# Patient Record
Sex: Female | Born: 1985 | Hispanic: Yes | Marital: Married | Smoking: Never smoker
Health system: Southern US, Community
[De-identification: ages and names within clinical notes are randomized; demographics above are authoritative.]

## PROBLEM LIST (undated history)

## (undated) DIAGNOSIS — R102 Pelvic and perineal pain: Secondary | ICD-10-CM

## (undated) DIAGNOSIS — K219 Gastro-esophageal reflux disease without esophagitis: Secondary | ICD-10-CM

## (undated) DIAGNOSIS — M549 Dorsalgia, unspecified: Secondary | ICD-10-CM

## (undated) DIAGNOSIS — Z973 Presence of spectacles and contact lenses: Secondary | ICD-10-CM

## (undated) DIAGNOSIS — F32A Depression, unspecified: Secondary | ICD-10-CM

## (undated) DIAGNOSIS — F419 Anxiety disorder, unspecified: Secondary | ICD-10-CM

## (undated) DIAGNOSIS — R3915 Urgency of urination: Secondary | ICD-10-CM

## (undated) DIAGNOSIS — K589 Irritable bowel syndrome without diarrhea: Secondary | ICD-10-CM

## (undated) DIAGNOSIS — F329 Major depressive disorder, single episode, unspecified: Secondary | ICD-10-CM

## (undated) DIAGNOSIS — R51 Headache: Principal | ICD-10-CM

## (undated) DIAGNOSIS — J309 Allergic rhinitis, unspecified: Secondary | ICD-10-CM

## (undated) DIAGNOSIS — E7800 Pure hypercholesterolemia, unspecified: Secondary | ICD-10-CM

## (undated) DIAGNOSIS — M1990 Unspecified osteoarthritis, unspecified site: Secondary | ICD-10-CM

## (undated) DIAGNOSIS — E739 Lactose intolerance, unspecified: Secondary | ICD-10-CM

## (undated) DIAGNOSIS — J45909 Unspecified asthma, uncomplicated: Secondary | ICD-10-CM

## (undated) DIAGNOSIS — M2550 Pain in unspecified joint: Secondary | ICD-10-CM

## (undated) DIAGNOSIS — R350 Frequency of micturition: Secondary | ICD-10-CM

## (undated) HISTORY — DX: Lactose intolerance, unspecified: E73.9

## (undated) HISTORY — DX: Gastro-esophageal reflux disease without esophagitis: K21.9

## (undated) HISTORY — DX: Pain in unspecified joint: M25.50

## (undated) HISTORY — DX: Unspecified asthma, uncomplicated: J45.909

## (undated) HISTORY — DX: Anxiety disorder, unspecified: F41.9

## (undated) HISTORY — DX: Pure hypercholesterolemia, unspecified: E78.00

## (undated) HISTORY — DX: Depression, unspecified: F32.A

## (undated) HISTORY — DX: Irritable bowel syndrome, unspecified: K58.9

## (undated) HISTORY — DX: Dorsalgia, unspecified: M54.9

## (undated) HISTORY — DX: Unspecified osteoarthritis, unspecified site: M19.90

## (undated) HISTORY — DX: Major depressive disorder, single episode, unspecified: F32.9

## (undated) HISTORY — DX: Headache: R51

## (undated) HISTORY — DX: Allergic rhinitis, unspecified: J30.9

---

## 2008-08-17 HISTORY — PX: OTHER SURGICAL HISTORY: SHX169

## 2010-04-24 ENCOUNTER — Inpatient Hospital Stay (HOSPITAL_COMMUNITY)
Admission: AD | Admit: 2010-04-24 | Discharge: 2010-04-26 | Payer: Self-pay | Source: Home / Self Care | Admitting: Obstetrics and Gynecology

## 2010-08-12 ENCOUNTER — Encounter (INDEPENDENT_AMBULATORY_CARE_PROVIDER_SITE_OTHER): Payer: Self-pay | Admitting: Obstetrics & Gynecology

## 2010-08-12 ENCOUNTER — Inpatient Hospital Stay (HOSPITAL_COMMUNITY)
Admission: AD | Admit: 2010-08-12 | Discharge: 2010-08-14 | Payer: Self-pay | Source: Home / Self Care | Attending: Obstetrics & Gynecology | Admitting: Obstetrics & Gynecology

## 2010-08-12 IMAGING — US US OB COMP +14 WK
1 series · 12 of 28 positions shown · non-contrast
Comparison: none

[Series 1: us ob comp +14 wk · 12 of 33 slices shown]
[im 2/33]
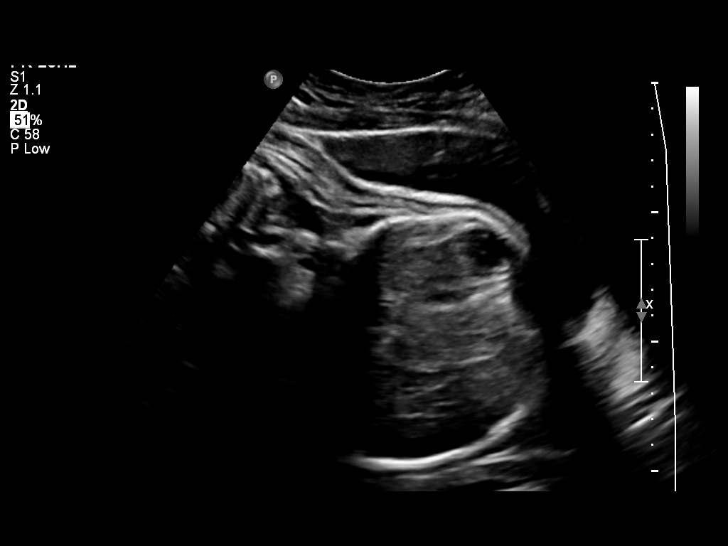
[im 4/33]
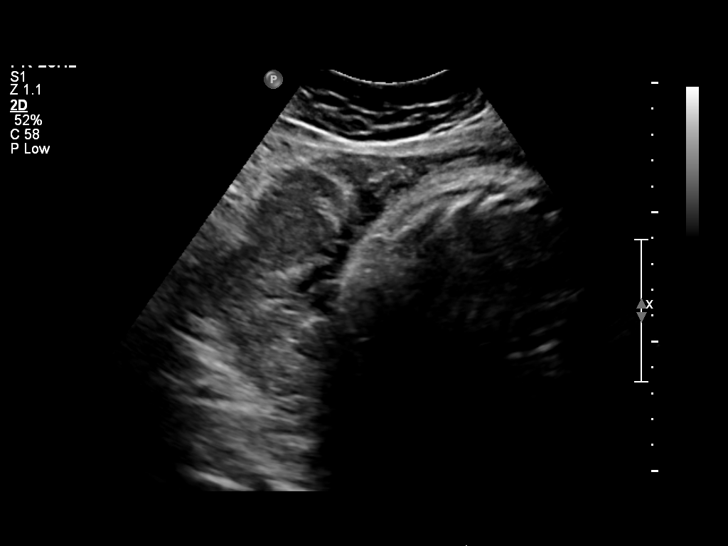
[im 6/33]
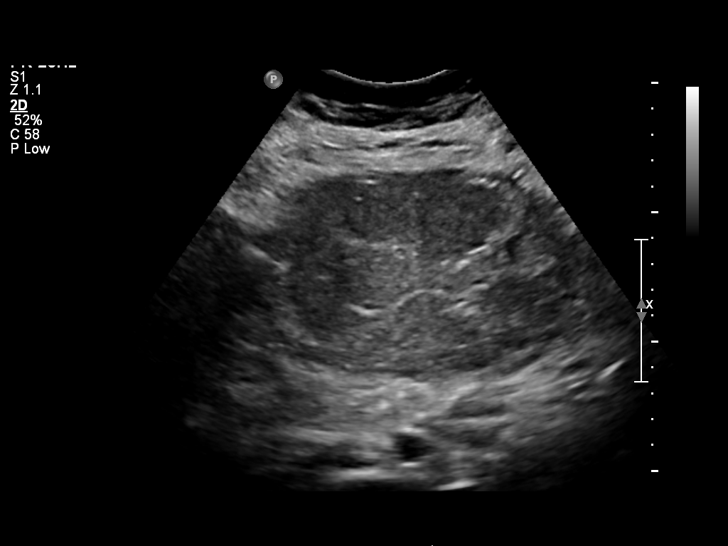
[im 10/33]
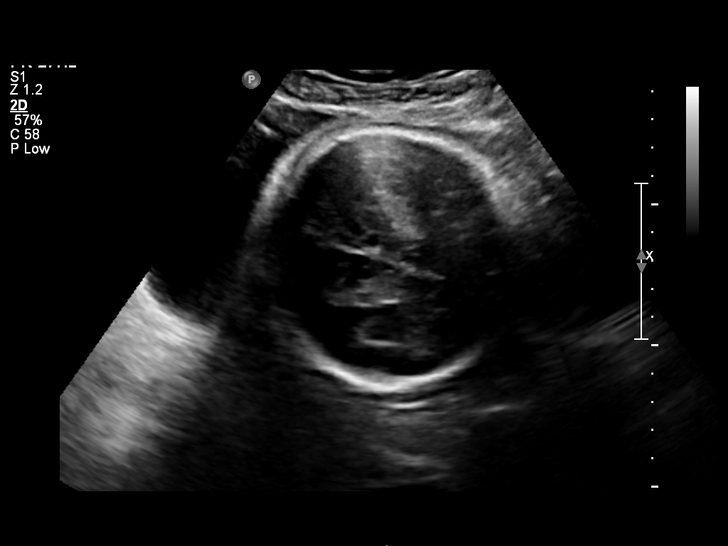
[im 12/33]
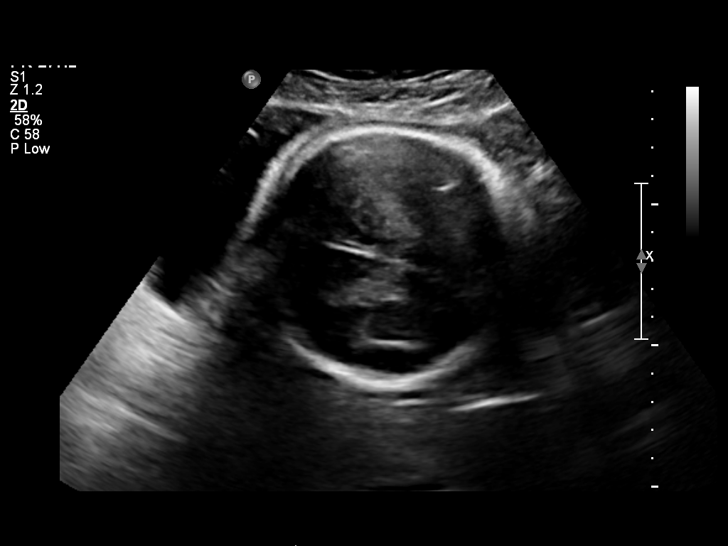
[im 15/33]
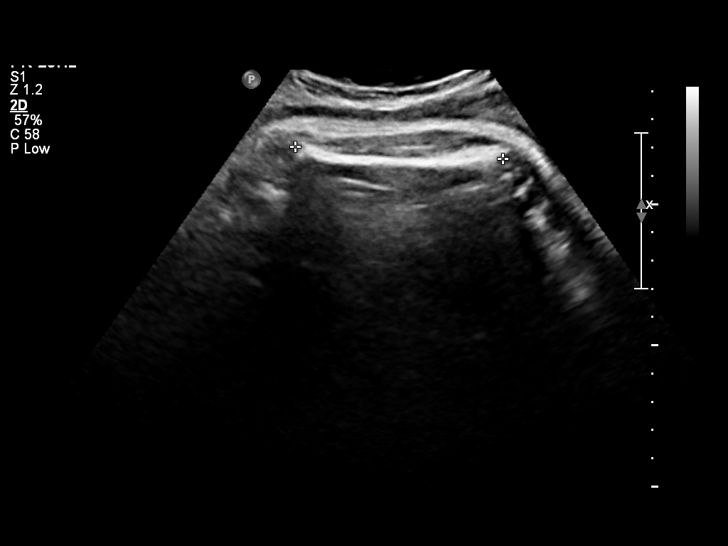
[im 18/33]
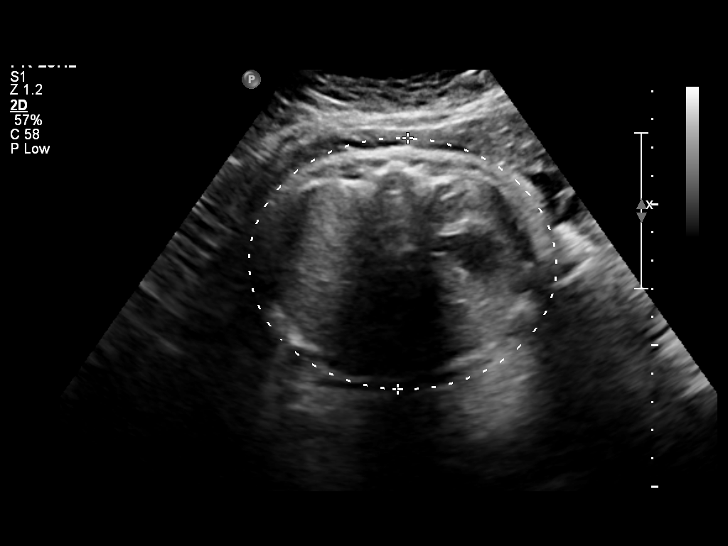
[im 21/33]
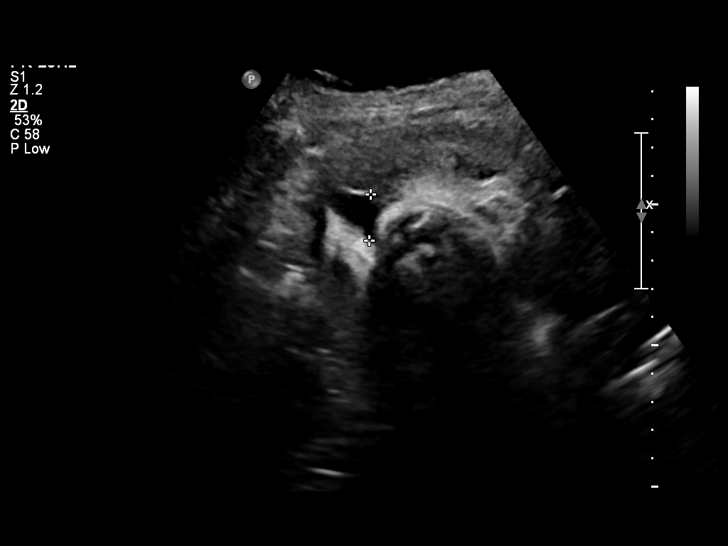
[im 23/33]
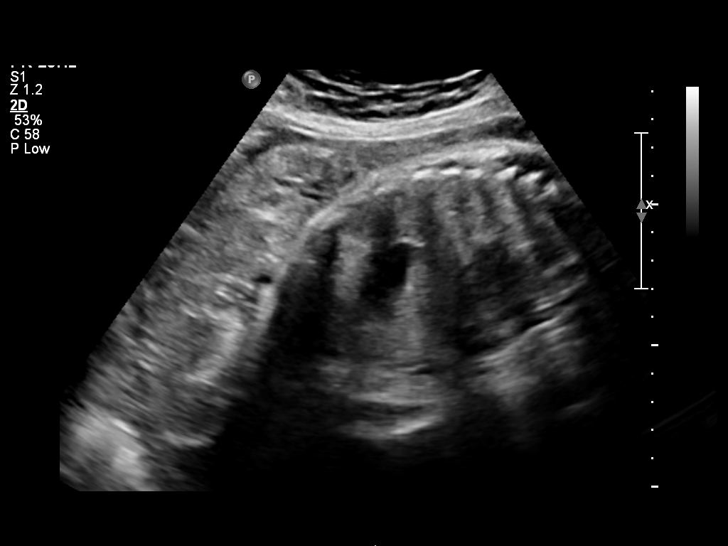
[im 27/33]
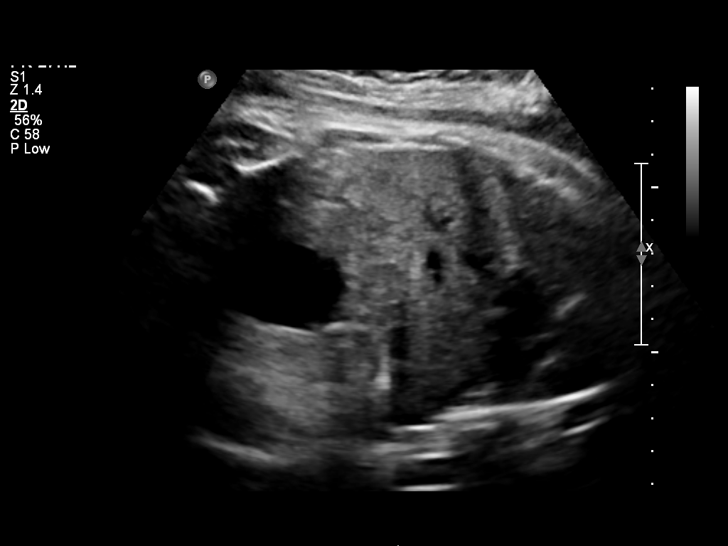
[im 29/33]
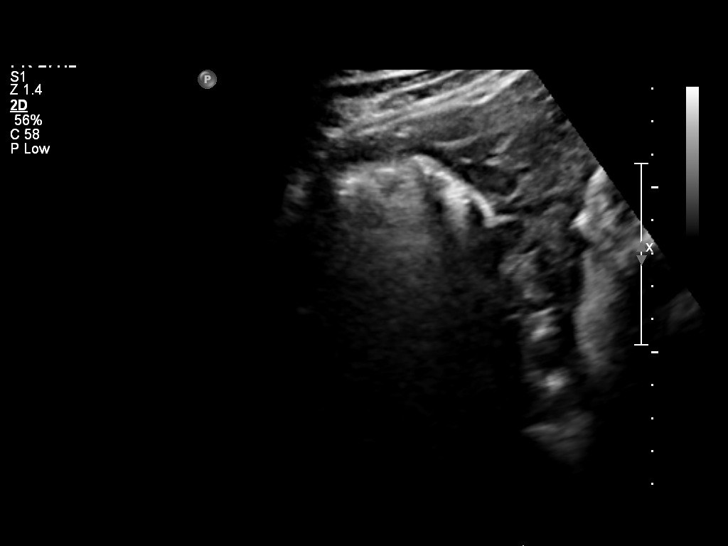
[im 31/33]
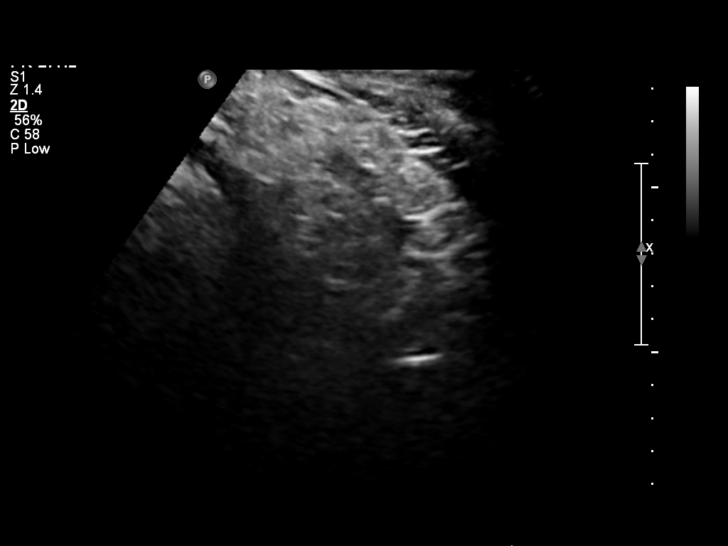

[12 of 28 positions shown; findings below may reference images not displayed]

OBSTETRICS REPORT

 Name:     RUBINSTEIN                   Visit Date: [DATE] [DATE]

Procedures

 US OB COMP +14 WK                                     76805.1
 US UA Cord Doppler                                    [S3]
Indications

 Assess Fetal Growth / Estimated Fetal Weight
 Assess amniotic fluid volume
 Premature rupture of membranes - leaking fluid
 Uncertain fetal presentation
 Previous pre-term deliveries  28 wks                  [S3]
 Previous cesarean section
 Size less than dates (Small for gestational [AGE]
 FGR)
Fetal Evaluation

 Fetal Heart Rate:  152                          bpm
 Cardiac Activity:  Observed
 Presentation:      Cephalic
 Placenta:          Anterior Fundal, above
                    cervical os
 P. Cord            Not well visualized
 Insertion:

 Amniotic Fluid
 AFI FV:      Oligohydramnios
 AFI Sum:     3.8     cm      < 3  %Tile
Biometry

 BPD:     86.6  mm     G. Age:  35w 0d                CI:        75.77   70 - 86
                                                      FL/HC:      23.0   20.6 -

 HC:     315.4  mm     G. Age:  35w 3d      < 3  %    HC/AC:      1.01   0.87 -

 AC:     312.9  mm     G. Age:  35w 1d      < 3  %    FL/BPD:     83.8   71 - 87
 FL:      72.6  mm     G. Age:  37w 1d       12  %    FL/AC:      23.2   20 - 24

 Est. FW:    [S3]  gm      6 lb 1 oz     13  %
Gestational Age
 Clinical EDD:  39w 2d                                        EDD:   [DATE]
 U/S Today:     35w 5d                                        EDD:   [DATE]
 Best:          39w 2d     Det. By:  Clinical EDD             EDD:   [DATE]
Anatomy

 Cranium:           Appears normal      Aortic Arch:       Not well
                                                           visualized
 Fetal Cavum:       Appears normal      Ductal Arch:       Not well
                                                           visualized
 Ventricles:        Appears normal      Diaphragm:         Appears normal
 Choroid Plexus:    Appears normal      Stomach:           Appears normal
 Cerebellum:        Not well            Abdomen:           Appears normal
                    visualized
 Posterior Fossa:   Not well            Abdominal Wall:    Not well
                    visualized                             visualized
 Nuchal Fold:       Not applicable      Cord Vessels:      Not well
                    (>20 wks GA)                           visualized
 Face:              Not well            Kidneys:           Appear normal
                    visualized
 Heart:             Not well            Bladder:           Appears normal
                    visualized
 RVOT:              Not well            Spine:             Not well
                    visualized                             visualized
 LVOT:              Not well            Limbs:             Not well
                    visualized                             visualized

 Other:     Technically difficult due to advanced GA and fetal
            position. Technically difficult due to  maternal habitus.
Doppler - Fetal Vessels

 Umbilical Artery
 S/D:   2.9            85  %tile
 Umbilical Artery
 Absent DFV:    No     Reverse DFV:    No

Cervix Uterus Adnexa

 Cervix:       Not visualized (advanced GA >34 wks)
 Left Ovary:    Not visualized.
 Right Ovary:   Not visualized.
Impression

 Single living intrauterine pregnancy in cephalic presentation.
 The estimated gestational age is 39w 2d based on Clinical
 EDD.
 Estimated fetal weight is 2753g,  13th percentile for
 gestational age of   39w 2d. Correlation with dating
 parameters recommended.
 Oligohydramnios.
 S/D ratio is 2.9, 85% for GA.
 No AEDF or RDF noted.

 Thank you for sharing in the care of Ms. RUBINSTEIN with
 questions or concerns.

## 2010-08-13 ENCOUNTER — Inpatient Hospital Stay: Admission: RE | Admit: 2010-08-13 | Payer: Self-pay | Source: Home / Self Care | Admitting: Obstetrics & Gynecology

## 2010-08-17 HISTORY — PX: ABDOMINAL HYSTERECTOMY: SHX81

## 2010-09-12 ENCOUNTER — Other Ambulatory Visit: Payer: Self-pay | Admitting: Obstetrics and Gynecology

## 2010-09-20 NOTE — Discharge Summary (Signed)
  NAMEJESSYCA, Annette Ellison                  ACCOUNT NO.:  [id number]  MEDICAL RECORD NO.:  [id number]          PATIENT TYPE:  INP  LOCATION:  9133                          FACILITY:  [facility]  PHYSICIAN:  Carrington Clamp, M.D. DATE OF BIRTH:  02-01-1986  DATE OF ADMISSION:  08/12/2010 DATE OF DISCHARGE:  08/14/2010                              DISCHARGE SUMMARY   FINAL DIAGNOSES:  Intrauterine gestation at 39-2/7 weeks' gestation, prolonged spontaneous rupture of membranes with oligohydramnios and meconium-stained amniotic fluid, borderline intrauterine growth restriction, elevated umbilical artery Dopplers, remote from vaginal delivery.  PROCEDURE:  Repeat low transverse cesarean section.  SURGEON:  Randye Lobo, M.D.  ASSISTANT:  Chinita [place], [place]  COMPLICATIONS:  None.  This 25 year old G2, P0-1-0-1 presents at 5 weeks' gestation with complaining of leaking of fluid.  The patient had a prior pregnancy which was delivered by cesarean section at 39 weeks.  The patient during this pregnancy had been followed for some borderline growth restriction.  She had an ultrasound performed on July 31, 2010, showing an estimated fetal weight in the 12th percentile with an AFI of 9.4.  The patient's BPP at that time was reassuring of 8/8.  A followup ultrasound showed an increase in her AFI to 16.43 with again another BPP of 8/8.  The patient is also positive for group B strep.  Upon admission to the hospital, the patient's cervix was only 1.5 cm dilated and -2 station.  Ultrasound confirmed rupture of membranes with an AFI of 3.8, and umbilical Dopplers which were elevated at 2.9.  There was prolonged rupture of membranes as well as meconium-stained amniotic fluid because of these changes in the elevated umbilical artery Dopplers, a decision was made to proceed with a repeat cesarean section.  The patient was taken to the operating room on August 12, 2010, by Dr. Conley Simmonds where a repeat  low transverse cesarean section was performed with the delivery of a 5 pound 11 ounce female infant with Apgars of 9 and 9. Delivery went without complications.  The patient's postoperative course was benign without any significant fevers.  The patient was felt ready for discharge on postoperative day #2 she was sent home on a regular diet, told to decrease activities, told to continue her vitamins.  The patient was given a prescription for Ultram to use every 6-8 hours as needed for her pain.  She did not want her little boy circumcised prior to discharge.  She was to follow up in our office on August 15, 2010, for her staple removal in the office.  Instructions and precautions were reviewed with the patient.  LABS ON DISCHARGE:  The patient had a hemoglobin of 11.0, white blood cell count of 11.5 and platelets of 170,000.     Leilani Able, P.A.-C.   ______________________________ Carrington Clamp, M.D.    MB/MEDQ  D:  09/15/2010  T:  09/16/2010  Job:  [id number]  Electronically Signed by Leilani Able P.A.-C. on 09/19/2010 04:43:48 PM Electronically Signed by Carrington Clamp MD on 09/20/2010 03:43:48 PM

## 2010-10-27 LAB — CBC
HCT: 33 % — ABNORMAL LOW (ref 36.0–46.0)
HCT: 37.4 % (ref 36.0–46.0)
Hemoglobin: 11 g/dL — ABNORMAL LOW (ref 12.0–15.0)
Hemoglobin: 12.8 g/dL (ref 12.0–15.0)
MCHC: 34.2 g/dL (ref 30.0–36.0)
Platelets: 170 10*3/uL (ref 150–400)
RBC: 4 MIL/uL (ref 3.87–5.11)
RBC: 4.53 MIL/uL (ref 3.87–5.11)
WBC: 11.5 10*3/uL — ABNORMAL HIGH (ref 4.0–10.5)

## 2010-10-27 LAB — ABO/RH: ABO/RH(D): O POS

## 2010-10-27 LAB — RPR: RPR Ser Ql: NONREACTIVE

## 2010-10-30 LAB — DIFFERENTIAL
Eosinophils Relative: 1 % (ref 0–5)
Lymphocytes Relative: 16 % (ref 12–46)
Lymphs Abs: 1.4 10*3/uL (ref 0.7–4.0)
Monocytes Relative: 8 % (ref 3–12)
Neutrophils Relative %: 76 % (ref 43–77)

## 2010-10-30 LAB — CBC
HCT: 35.4 % — ABNORMAL LOW (ref 36.0–46.0)
HCT: 39.3 % (ref 36.0–46.0)
Hemoglobin: 13.3 g/dL (ref 12.0–15.0)
MCH: 31.1 pg (ref 26.0–34.0)
Platelets: 220 10*3/uL (ref 150–400)
RBC: 3.9 MIL/uL (ref 3.87–5.11)
RDW: 12.9 % (ref 11.5–15.5)
WBC: 13.9 10*3/uL — ABNORMAL HIGH (ref 4.0–10.5)
WBC: 9.2 10*3/uL (ref 4.0–10.5)

## 2010-10-30 LAB — COMPREHENSIVE METABOLIC PANEL
AST: 21 U/L (ref 0–37)
BUN: 8 mg/dL (ref 6–23)
CO2: 21 mEq/L (ref 19–32)
Chloride: 105 mEq/L (ref 96–112)
Potassium: 3.4 mEq/L — ABNORMAL LOW (ref 3.5–5.1)
Sodium: 134 mEq/L — ABNORMAL LOW (ref 135–145)
Total Bilirubin: 0.3 mg/dL (ref 0.3–1.2)
Total Protein: 6.2 g/dL (ref 6.0–8.3)

## 2010-10-30 LAB — URINE CULTURE
Colony Count: >=100000
Culture  Setup Time: 201109090154

## 2010-10-30 LAB — URINALYSIS, ROUTINE W REFLEX MICROSCOPIC
Hgb urine dipstick: NEGATIVE
Ketones, ur: 40 mg/dL — AB
Nitrite: NEGATIVE
Protein, ur: NEGATIVE mg/dL
Urobilinogen, UA: 0.2 mg/dL (ref 0.0–1.0)
pH: 6 (ref 5.0–8.0)

## 2010-10-30 LAB — URINE MICROSCOPIC-ADD ON

## 2011-07-07 ENCOUNTER — Ambulatory Visit: Payer: Self-pay | Admitting: Obstetrics & Gynecology

## 2011-07-16 ENCOUNTER — Ambulatory Visit: Payer: Self-pay | Admitting: Obstetrics & Gynecology

## 2013-04-01 LAB — LAB REPORT - SCANNED

## 2013-04-10 LAB — HM COLONOSCOPY

## 2013-10-31 ENCOUNTER — Encounter: Payer: Self-pay | Admitting: Neurology

## 2013-10-31 ENCOUNTER — Other Ambulatory Visit: Payer: Self-pay | Admitting: Family Medicine

## 2013-10-31 ENCOUNTER — Ambulatory Visit
Admission: RE | Admit: 2013-10-31 | Discharge: 2013-10-31 | Disposition: A | Discharge status: Home / Self Care | Payer: 59 | Source: Ambulatory Visit | Attending: Family Medicine | Admitting: Family Medicine

## 2013-10-31 DIAGNOSIS — M542 Cervicalgia: Secondary | ICD-10-CM

## 2013-10-31 IMAGING — CR DG CERVICAL SPINE COMPLETE 4+V
6 series · 6 of 6 positions shown · non-contrast
Comparison: None.

CLINICAL DATA: 27-year-old female with right neck pain radiating to
the shoulder. Right upper extremity numbness. Initial encounter.

EXAM:
CERVICAL SPINE  4+ VIEWS

[view not recorded (1 of 6)]
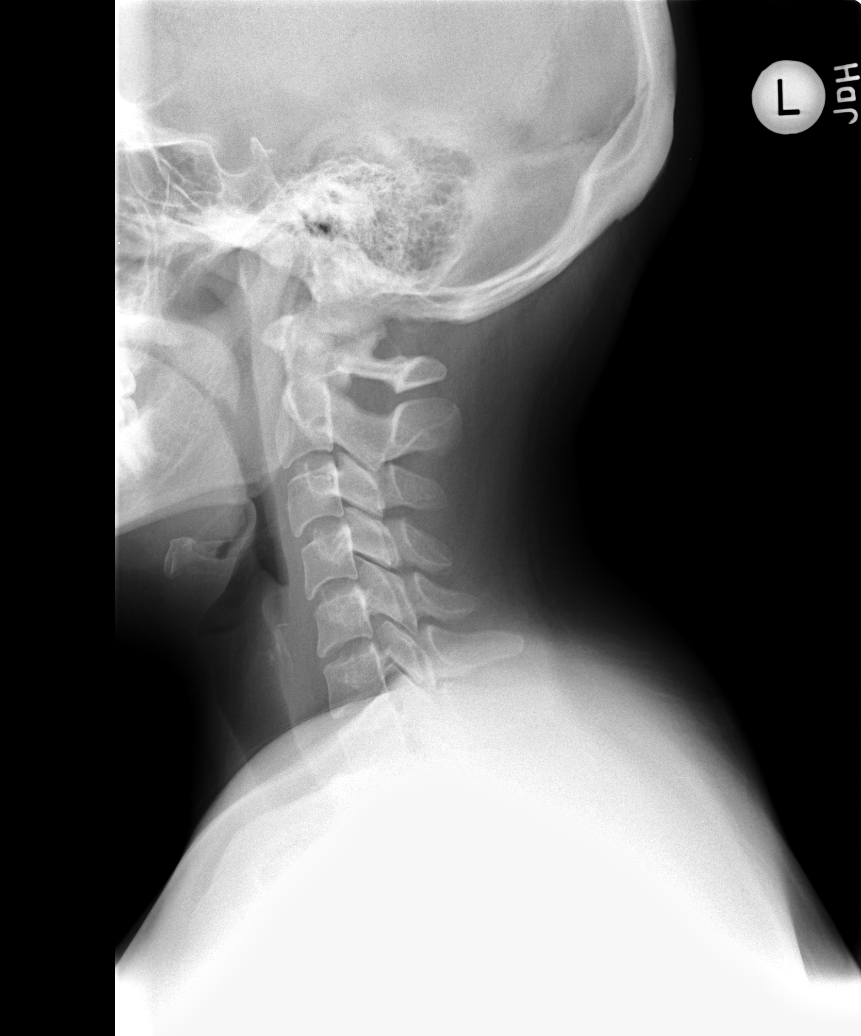

[view not recorded (2 of 6)]
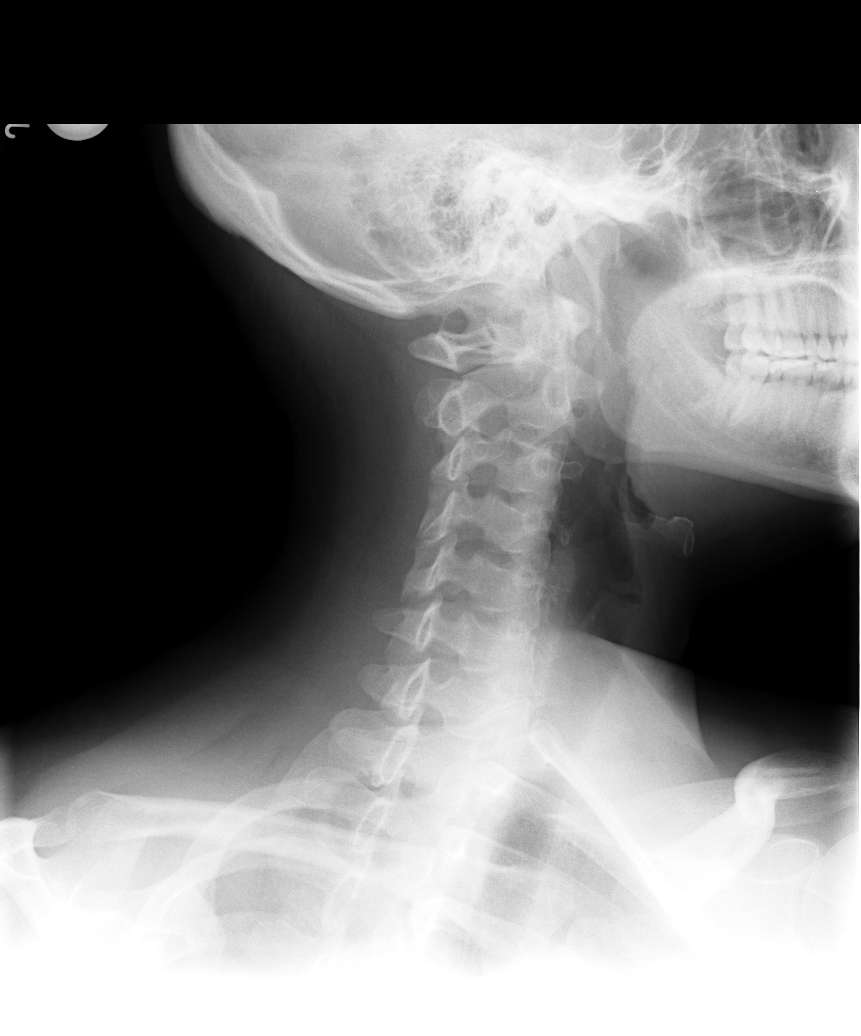

[view not recorded (3 of 6)]
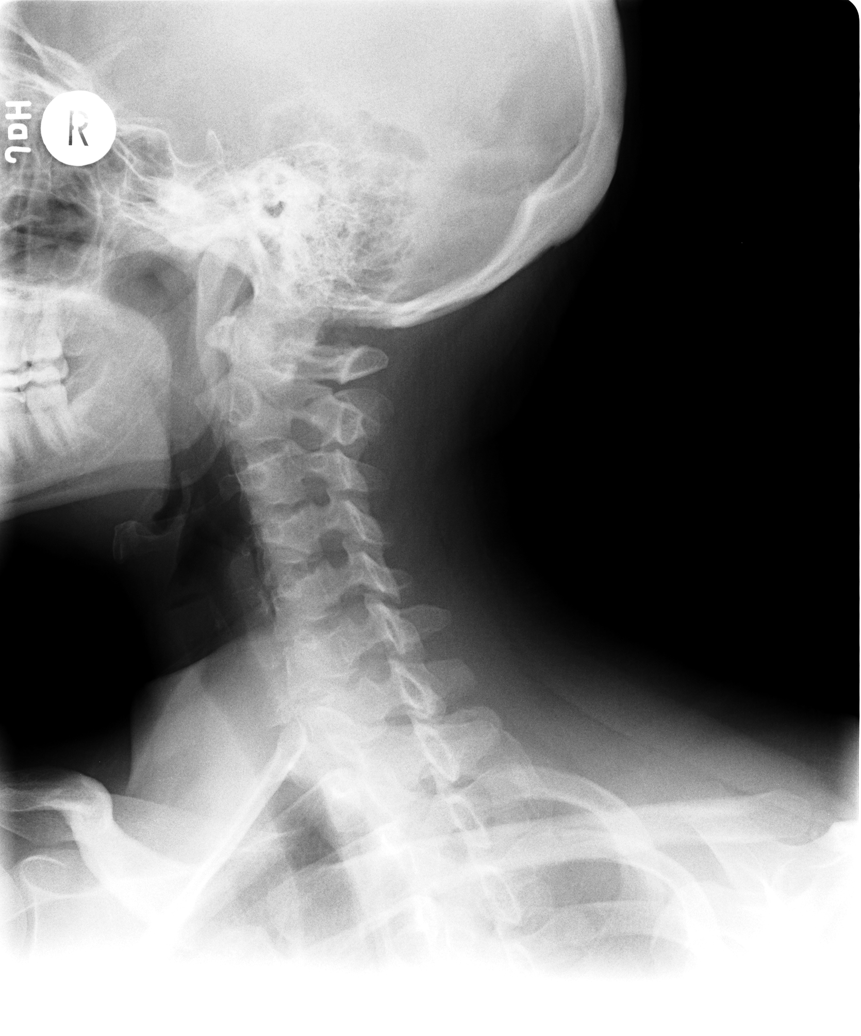

[view not recorded (4 of 6)]
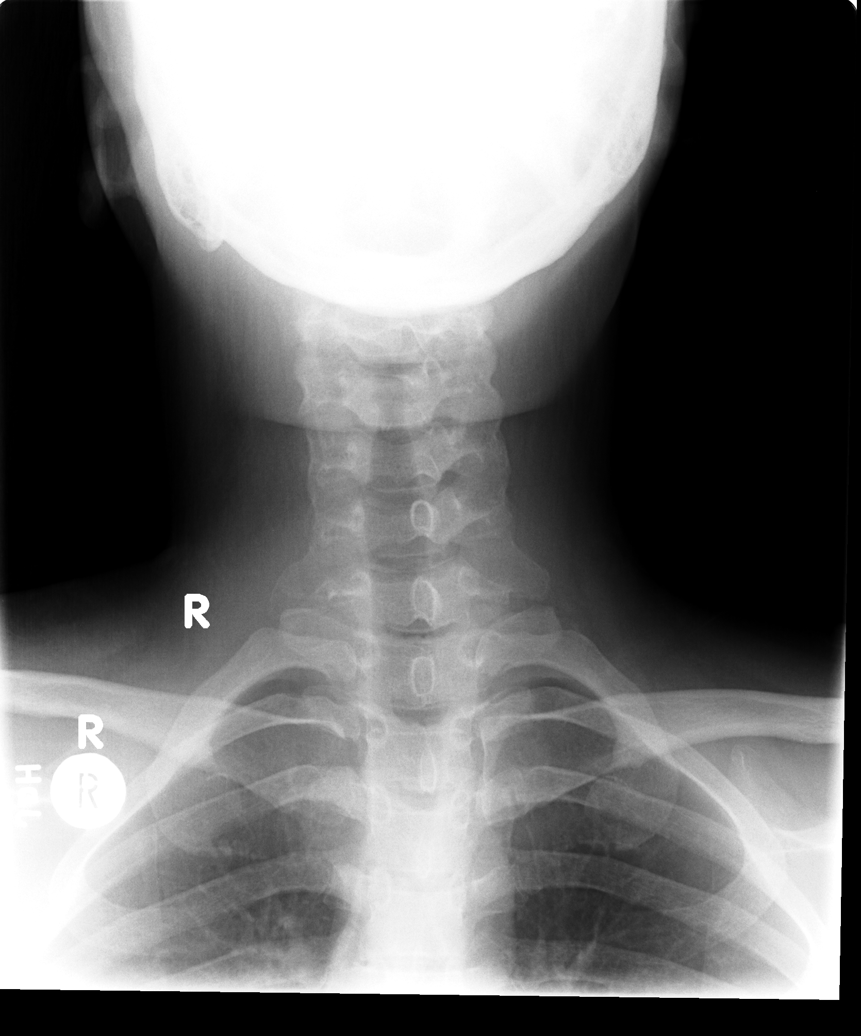

[view not recorded (5 of 6)]
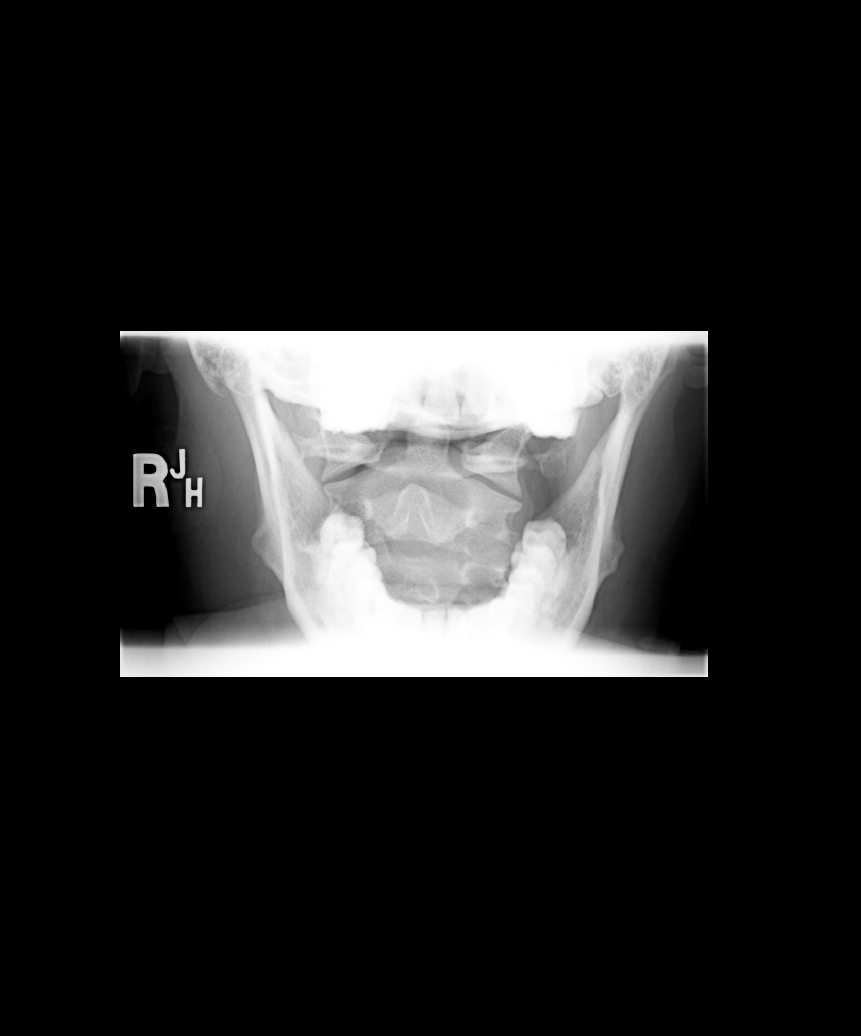

[view not recorded (6 of 6)]
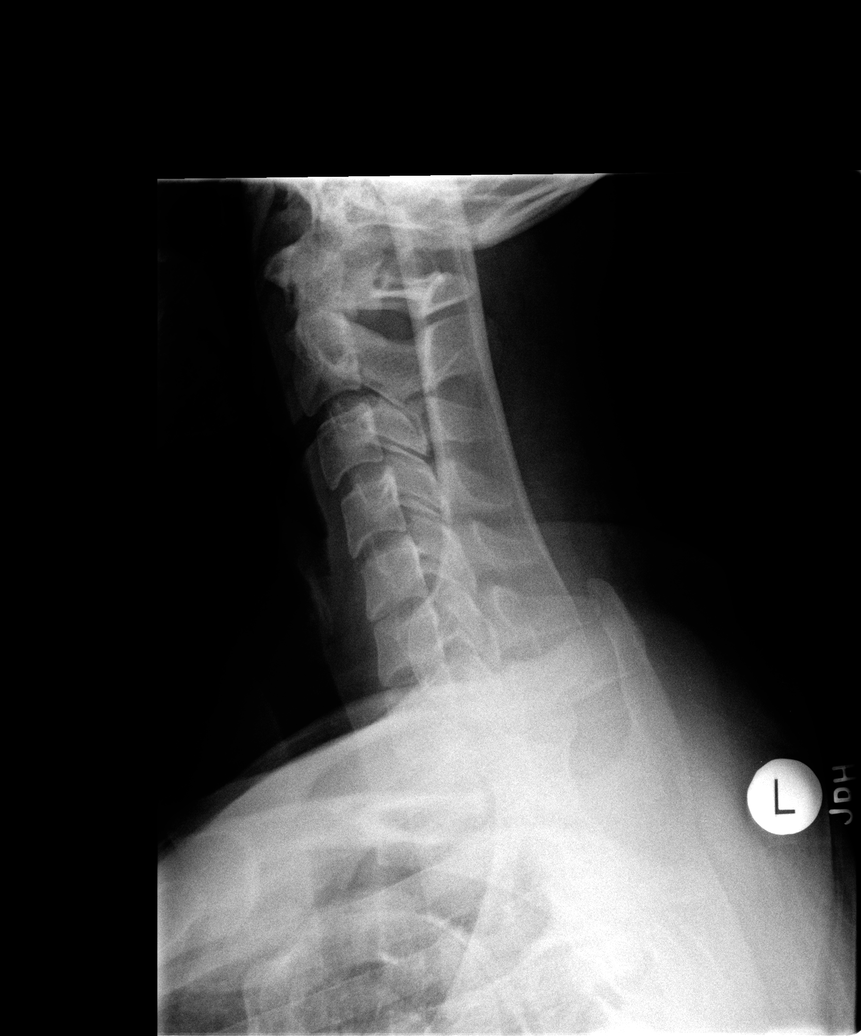

[6 of 6 positions shown; findings below may reference images not displayed]

FINDINGS: Normal prevertebral soft tissue contour. Straightening of cervical
lordosis. Preserved disc spaces. Bilateral posterior element
alignment is within normal limits. Cervicothoracic junction
alignment is within normal limits. Mild cervicothoracic scoliosis
evident on the AP view. Negative lung apices. Normal C1-C2
alignment. The odontoid appears normal.
IMPRESSION: Negative cervical spine radiographs, aside from suspected mild
cervicothoracic scoliosis with superimposed straightening of
lordosis.

## 2013-11-01 ENCOUNTER — Ambulatory Visit (INDEPENDENT_AMBULATORY_CARE_PROVIDER_SITE_OTHER): Payer: 59 | Admitting: Neurology

## 2013-11-01 ENCOUNTER — Encounter: Payer: Self-pay | Admitting: Neurology

## 2013-11-01 VITALS — BP 120/76 | HR 76 | Ht 62.0 in | Wt 155.0 lb

## 2013-11-01 DIAGNOSIS — R519 Headache, unspecified: Secondary | ICD-10-CM

## 2013-11-01 DIAGNOSIS — R51 Headache: Secondary | ICD-10-CM

## 2013-11-01 MED ORDER — SUMATRIPTAN SUCCINATE 100 MG PO TABS: 100.0000 mg | ORAL_TABLET | ORAL | Status: DC | PRN

## 2013-11-01 NOTE — Progress Notes (Signed)
PATIENT: Annette Ellison DOB: 02-22-1986  HISTORICAL  Annette Ellison is a 28 years old right-handed female, referred by her primary care physician Dr. Chapman Fitch for evaluation of headaches, facial numbness.  She had a history of headaches since elementary school, usually triggered by bright light, reading, strong smell, such as perfume, she would develop lateralized pounding headache was associated light noise sensitivity, lasting couple hours to days, she only had few headache each month,  But over the past few years, she has more frequent headaches, she has been taking Excedrin Migraine, at least 4 days out of the week for few years, she usually take 4 tablets at once, sometimes 2 more tablets before she goes to bed, about 3 months ago, her headache got more frequent, she began to have headaches every day, Excedrin Migraine was no longer helpful, she has constant right retro-orbital area and bilateral temporal area pressure pulsating pain, sometimes go down to her right occipital neck area, 5/10 on daily basis,  [facility], was given prescription of Topamax 25 mg 3 tablets every night, she complains of facial numbness after she took the medication, and also a mild drowsiness this morning,  Previously she has tried Imitrex, which was very effective as abortive treatment, she tried trigger point injection in the past, complained of muscle achy pain, otherwise never tried other preventive medications,  REVIEW OF SYSTEMS: Full 14 system review of systems performed and notable only for weight gain, fatigue, eye pain, headache, numbness, insomnia, anxiety, decreased energy, change in appetite  ALLERGIES: Allergies not on file  HOME MEDICATIONS: No current outpatient prescriptions on file prior to visit.   No current facility-administered medications on file prior to visit.    PAST MEDICAL HISTORY: Past Medical History  Diagnosis Date  . High  cholesterol   . HA (headache)     PAST SURGICAL HISTORY: Past Surgical History  Procedure Laterality Date  . Abdominal hysterectomy      FAMILY HISTORY: Family History  Problem Relation Age of Onset  . Migraines Mother   . High Cholesterol Mother   . High blood pressure Mother     SOCIAL HISTORY:  History   Social History  . Marital Status: Single    Spouse Name: N/A    Number of Children: 2  . Years of Education: College   Occupational History  . [employer]   Social History Main Topics  . Smoking status: Never Smoker   . Smokeless tobacco: Never Used  . Alcohol Use: 0.6 oz/week    1 Glasses of wine per week     Comment: OCC  . Drug Use: No  . Sexual Activity: Not on file   Other Topics Concern  . Not on file   Social History Narrative   Patient lives at home with her partner. Patient works Full time at [employer]   Right handed   Caffeine two cups of coffee daily     PHYSICAL EXAM   Filed Vitals:   11/01/13 0758  BP: 120/76  Pulse: 76  Height: _0  (1.575 m)  Weight: 155 lb (70.308 kg)    Not recorded    Body mass index is 28.34 kg/(m^2).   Generalized: In no acute distress  Neck: Supple, no carotid bruits   Cardiac: Regular rate rhythm  Pulmonary: Clear to auscultation bilaterally  Musculoskeletal: No deformity  Neurological examination  Mentation: Alert oriented to  time, place, history taking, and causual conversation  Cranial nerve II-XII: Pupils were equal round reactive to light. Extraocular movements were full.  Visual field were full on confrontational test. Bilateral fundi were sharp.  Facial sensation and strength were normal. Hearing was intact to finger rubbing bilaterally. Uvula tongue midline.  Head turning and shoulder shrug and were normal and symmetric.Tongue protrusion into cheek strength was normal.  Motor: Normal tone, bulk and strength.  Sensory: Intact to fine touch, pinprick, preserved  vibratory sensation, and proprioception at toes.  Coordination: Normal finger to nose, heel-to-shin bilaterally there was no truncal ataxia  Gait: Rising up from seated position without assistance, normal stance, without trunk ataxia, moderate stride, good arm swing, smooth turning, able to perform tiptoe, and heel walking without difficulty.   Romberg signs: Negative  Deep tendon reflexes: Brachioradialis 2/2, biceps 2/2, triceps 2/2, patellar 2/2, Achilles 2/2, plantar responses were flexor bilaterally.   DIAGNOSTIC DATA (LABS, IMAGING, TESTING) - I reviewed patient records, labs, notes, testing and imaging myself where available.  Lab Results  Component Value Date   WBC 11.5* 08/13/2010   HGB 11.0* 08/13/2010   HCT 33.0* 08/13/2010   MCV 82.5 08/13/2010   PLT 170 08/13/2010      Component Value Date/Time   NA 134* 04/24/2010 2026   K 3.4* 04/24/2010 2026   CL 105 04/24/2010 2026   CO2 21 04/24/2010 2026   GLUCOSE 69* 04/24/2010 2026   BUN 8 04/24/2010 2026   CREATININE 0.38* 04/24/2010 2026   CALCIUM 9.0 04/24/2010 2026   PROT 6.2 04/24/2010 2026   ALBUMIN 3.2* 04/24/2010 2026   AST 21 04/24/2010 2026   ALT 23 04/24/2010 2026   ALKPHOS 81 04/24/2010 2026   BILITOT 0.3 04/24/2010 2026   GFRNONAA >60 04/24/2010 2026   GFRAA  Value: >60        The eGFR has been calculated using the MDRD equation. This calculation has not been validated in all clinical situations. eGFR's persistently <60 mL/min signify possible Chronic Kidney Disease. 04/24/2010 2026    ASSESSMENT AND PLAN  Annette Ellison is a 28 y.o. female present with frequent migraine headaches, normal neurological examination,  her headache also has a component of medicine withdraw due to her daily high-dose Excedrin use.  1. continue topiramate 25 mg 3 tablets every night as preventive medications 2. As needed Imitrex, she decided [facility]   Marcial Pacas, M.D. Ph.D.  [facility] [address] [place], [place] [address] [phone]

## 2013-11-07 ENCOUNTER — Telehealth: Payer: Self-pay | Admitting: Neurology

## 2013-11-07 NOTE — Telephone Encounter (Signed)
Pt called states she is still [facility] but pt states the medication SUMAtriptan (IMITREX) 100 MG tablet is making her head worse. Pt would like for Dr. Terrace ArabiaYan or nurse to return her call concerning this matter. Thanks

## 2013-11-07 NOTE — Telephone Encounter (Signed)
Pt calling stating that she is still going to the [facility] but pt states that the medication sumatriptan is making her headaches worst. Please advise

## 2013-11-08 NOTE — Telephone Encounter (Signed)
She has no followup appointment with us anymore, she should contact [facility] physician [facility] for further advice.

## 2013-11-08 NOTE — Telephone Encounter (Signed)
Called pt to inform her per Dr. Terrace ArabiaYan that she should f/u with the physician at the [facility] for further advice. I advised the pt that if she has any other problems to contact the office. Pt verbalized understanding.

## 2014-02-07 ENCOUNTER — Other Ambulatory Visit: Payer: Self-pay | Admitting: Urology

## 2014-03-09 ENCOUNTER — Encounter (HOSPITAL_BASED_OUTPATIENT_CLINIC_OR_DEPARTMENT_OTHER): Payer: Self-pay | Admitting: *Deleted

## 2014-03-09 NOTE — Progress Notes (Signed)
NPO AFTER MN.  ARRIVE AT 0600.  NEEDS HG.  

## 2014-03-14 NOTE — H&P (Signed)
History of Present Illness   Ms Annette Ellison had a hysterectomy and intraoperative bladder repair fixed in 2012. She leaks with coughing, sneezing, sometimes bending and lifting. Sometimes he has urge incontinence and denies enuresis. She voids every 1-2 hours and gets up at least 3 times at night. She wears 2 liners a day moderately wet. She has left lower-quadrant pain and dyspareunia. She had the same pain prior to her hysterectomy and was treated for irritable bowel syndrome and was diagnosed with polycystic ovarian disease. Dr Billy Coastaavon cleared her for this latter diagnosis. She thinks the pain may be worse now. Her levator muscles were tender on pelvic examination. She had a little bit of left lower-quadrant pain as I filled her bladder. I thought her incontinence was a little bit out of the ordinary for her age. She also had increased frequency and moderate nocturia. The left lower-quadrant pain is intermittently relieved when she voids. I thought she would benefit from urodynamics and cystoscopy. I did not order a CT scan or mentioned hydrodistension.   To clarify, she did have a cystoscopy that was normal and that the pain increased a little bit during filling.   Review of Systems: No change in bowel or neurologic systems.   She did empty efficiently. Maximum capacity is 440 mL. She may have had some low-pressure instability but she did not leak. She expressed a strong urge during the contractions. Capacity was limited by fullness and not pain. She had not leaked with a Valsalva pressure of 123 cmH2O. During voluntary voiding, she voided approximately 30 mL with a with a maximum flow of 8 mL/sec. Maximum voiding pressure at 54 cmH2O. Residual was 400 mL. She was coached to void without straining. After standing, she voided approximately 375 mL and residual was 25 mL. There was a general increase in EMG Activity during the latter part of the study. Bladder neck descended 1-2 cm. On further review, she did  have discomfort during bladder filling in both the right and left lower-quadrant. She rated it 8 out of 10. She also had urgency. Her discomfort decreased to a 6 out of 10 after voiding a small amount. The details of the urodynamics are signed and dictated on the urodynamic sheet.    Past Medical History Problems  1. History of arthritis (V13.4) 2. History of esophageal reflux (V12.79) 3. History of hypercholesterolemia (V12.29)  Surgical History Problems  1. History of Cesarean Section 2. History of Complete Colonoscopy 3. History of Hysterectomy  Current Meds 1. Nitrofurantoin Monohyd Macro 100 MG Oral Capsule; TAKE 1 CAPSULE BID;  Therapy: 05Jun2015 to (Evaluate:12Jun2015)  Requested for: 05Jun2015; Last  Rx:05Jun2015 Ordered  Allergies Medication  1. Penicillins 2. Vicodin TABS  Social History Problems  1. Denied: History of Alcohol use 2. Caffeine use (V49.89)   1 drink daily 3. Non-smoker (V49.89) 4. Number of children   1 son1 daughter 5. Occupation   Psychologist, occupationalbanker 6. Single  Assessment Assessed  1. Urge and stress incontinence (788.33) 2. Chronic cystitis (595.2)  Plan Chronic cystitis  1. 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 Certainly physical therapy may help her pelvic pain and/or dysuria and/or incontinence moving forward. She has a mild overactive bladder. She understands she may have interstitial cystitis.   I talked to her about a hydrodistension.  We talked about cystoscopy/hydrodistension and instillation  in detail. Pros, cons, general surgical and anesthetic risks, and other options including watchful waiting were discussed. Risks were described but not limited to pain, infection, and bleeding. The risk of bladder perforation and management were discussed. The patient understands that  it is primarily a diagnostic procedure.   I talked to her about a CT scan.   I am suspect that Ms Bost could have interstitial cystitis and a hydrodistension will be scheduled. She said she has irritable bowel symptoms and is having a lot of abdominal cramping and this may be bowel related. She does not have a left ovary or uterus. I am not going to order a CT scan. We will proceed accordingly.   After a thorough review of the management options for the patient's condition the patient  elected to proceed with surgical therapy as noted above. We have discussed the potential benefits and risks of the procedure, side effects of the proposed treatment, the likelihood of the patient achieving the goals of the procedure, and any potential problems that might occur during the procedure or recuperation. Informed consent has been obtained.

## 2014-03-15 ENCOUNTER — Ambulatory Visit (HOSPITAL_BASED_OUTPATIENT_CLINIC_OR_DEPARTMENT_OTHER)
Admission: RE | Admit: 2014-03-15 | Discharge: 2014-03-15 | Disposition: A | Discharge status: Home / Self Care | Payer: 59 | Source: Ambulatory Visit | Attending: Urology | Admitting: Urology

## 2014-03-15 ENCOUNTER — Encounter (HOSPITAL_BASED_OUTPATIENT_CLINIC_OR_DEPARTMENT_OTHER)
Admission: RE | Disposition: A | Discharge status: Home / Self Care | Payer: Self-pay | Source: Ambulatory Visit | Attending: Urology

## 2014-03-15 ENCOUNTER — Encounter (HOSPITAL_BASED_OUTPATIENT_CLINIC_OR_DEPARTMENT_OTHER): Payer: 59 | Admitting: Anesthesiology

## 2014-03-15 ENCOUNTER — Ambulatory Visit (HOSPITAL_BASED_OUTPATIENT_CLINIC_OR_DEPARTMENT_OTHER): Payer: 59 | Admitting: Anesthesiology

## 2014-03-15 ENCOUNTER — Encounter (HOSPITAL_BASED_OUTPATIENT_CLINIC_OR_DEPARTMENT_OTHER): Payer: Self-pay | Admitting: *Deleted

## 2014-03-15 DIAGNOSIS — N3946 Mixed incontinence: Secondary | ICD-10-CM | POA: Insufficient documentation

## 2014-03-15 DIAGNOSIS — E7800 Pure hypercholesterolemia, unspecified: Secondary | ICD-10-CM | POA: Insufficient documentation

## 2014-03-15 DIAGNOSIS — N3020 Other chronic cystitis without hematuria: Secondary | ICD-10-CM | POA: Insufficient documentation

## 2014-03-15 DIAGNOSIS — N949 Unspecified condition associated with female genital organs and menstrual cycle: Secondary | ICD-10-CM | POA: Diagnosis present

## 2014-03-15 DIAGNOSIS — K219 Gastro-esophageal reflux disease without esophagitis: Secondary | ICD-10-CM | POA: Insufficient documentation

## 2014-03-15 DIAGNOSIS — Z90710 Acquired absence of both cervix and uterus: Secondary | ICD-10-CM | POA: Insufficient documentation

## 2014-03-15 HISTORY — PX: CYSTO WITH HYDRODISTENSION: SHX5453

## 2014-03-15 HISTORY — DX: Presence of spectacles and contact lenses: Z97.3

## 2014-03-15 HISTORY — DX: Frequency of micturition: R35.0

## 2014-03-15 HISTORY — DX: Urgency of urination: R39.15

## 2014-03-15 HISTORY — DX: Pelvic and perineal pain: R10.2

## 2014-03-15 LAB — POCT HEMOGLOBIN-HEMACUE: Hemoglobin: 15.3 g/dL — ABNORMAL HIGH (ref 12.0–15.0)

## 2014-03-15 SURGERY — CYSTOSCOPY, WITH BLADDER HYDRODISTENSION
Anesthesia: General | Site: Bladder

## 2014-03-15 MED ORDER — FENTANYL CITRATE 0.05 MG/ML IJ SOLN
25.0000 ug | INTRAMUSCULAR | Status: DC | PRN
  Administered 2014-03-15: 50 ug via INTRAVENOUS
  Filled 2014-03-15: qty 1

## 2014-03-15 MED ORDER — DEXAMETHASONE SODIUM PHOSPHATE 4 MG/ML IJ SOLN
INTRAMUSCULAR | Status: DC | PRN
  Administered 2014-03-15: 10 mg via INTRAVENOUS

## 2014-03-15 MED ORDER — LIDOCAINE HCL (CARDIAC) 20 MG/ML IV SOLN
INTRAVENOUS | Status: DC | PRN
  Administered 2014-03-15: 80 mg via INTRAVENOUS

## 2014-03-15 MED ORDER — MIDAZOLAM HCL 2 MG/2ML IJ SOLN
INTRAMUSCULAR | Status: AC
  Filled 2014-03-15: qty 2

## 2014-03-15 MED ORDER — CIPROFLOXACIN HCL 250 MG PO TABS: 250.0000 mg | ORAL_TABLET | Freq: Two times a day (BID) | ORAL | Status: DC

## 2014-03-15 MED ORDER — OXYCODONE-ACETAMINOPHEN 5-325 MG PO TABS: 1.0000 | ORAL_TABLET | Freq: Four times a day (QID) | ORAL | Status: DC | PRN

## 2014-03-15 MED ORDER — ONDANSETRON HCL 4 MG/2ML IJ SOLN
INTRAMUSCULAR | Status: DC | PRN
  Administered 2014-03-15: 4 mg via INTRAVENOUS

## 2014-03-15 MED ORDER — OXYCODONE-ACETAMINOPHEN 5-325 MG PO TABS
ORAL_TABLET | ORAL | Status: AC
  Filled 2014-03-15: qty 1

## 2014-03-15 MED ORDER — LACTATED RINGERS IV SOLN
INTRAVENOUS | Status: DC
  Administered 2014-03-15: 07:00:00 via INTRAVENOUS
  Filled 2014-03-15: qty 1000

## 2014-03-15 MED ORDER — PHENAZOPYRIDINE HCL 200 MG PO TABS
ORAL | Status: DC | PRN
  Administered 2014-03-15: 08:00:00 via INTRAVESICAL

## 2014-03-15 MED ORDER — STERILE WATER FOR IRRIGATION IR SOLN
Status: DC | PRN
  Administered 2014-03-15: 3000 mL

## 2014-03-15 MED ORDER — FENTANYL CITRATE 0.05 MG/ML IJ SOLN
INTRAMUSCULAR | Status: DC | PRN
  Administered 2014-03-15: 50 ug via INTRAVENOUS

## 2014-03-15 MED ORDER — KETOROLAC TROMETHAMINE 30 MG/ML IJ SOLN
15.0000 mg | Freq: Once | INTRAMUSCULAR | Status: DC | PRN
  Filled 2014-03-15: qty 1

## 2014-03-15 MED ORDER — CIPROFLOXACIN IN D5W 400 MG/200ML IV SOLN
400.0000 mg | INTRAVENOUS | Status: AC
  Administered 2014-03-15: 400 mg via INTRAVENOUS
  Filled 2014-03-15: qty 200

## 2014-03-15 MED ORDER — PROPOFOL 10 MG/ML IV BOLUS
INTRAVENOUS | Status: DC | PRN
  Administered 2014-03-15: 200 mg via INTRAVENOUS

## 2014-03-15 MED ORDER — FENTANYL CITRATE 0.05 MG/ML IJ SOLN
INTRAMUSCULAR | Status: AC
  Filled 2014-03-15: qty 4

## 2014-03-15 MED ORDER — PROMETHAZINE HCL 25 MG/ML IJ SOLN
6.2500 mg | INTRAMUSCULAR | Status: DC | PRN
  Filled 2014-03-15: qty 1

## 2014-03-15 MED ORDER — OXYCODONE-ACETAMINOPHEN 5-325 MG PO TABS
1.0000 | ORAL_TABLET | ORAL | Status: DC | PRN
  Administered 2014-03-15: 1 via ORAL
  Filled 2014-03-15: qty 1

## 2014-03-15 MED ORDER — MIDAZOLAM HCL 5 MG/5ML IJ SOLN
INTRAMUSCULAR | Status: DC | PRN
  Administered 2014-03-15: 2 mg via INTRAVENOUS

## 2014-03-15 MED ORDER — HYDROCODONE-ACETAMINOPHEN 5-325 MG PO TABS: 1.0000 | ORAL_TABLET | Freq: Four times a day (QID) | ORAL | Status: DC | PRN

## 2014-03-15 MED ORDER — FENTANYL CITRATE 0.05 MG/ML IJ SOLN
INTRAMUSCULAR | Status: AC
  Filled 2014-03-15: qty 2

## 2014-03-15 SURGICAL SUPPLY — 17 items
BAG DRAIN URO-CYSTO SKYTR STRL (DRAIN) ×3 IMPLANT
CANISTER SUCT LVC 12 LTR MEDI- (MISCELLANEOUS) ×3 IMPLANT
CATH ROBINSON RED A/P 14FR (CATHETERS) ×3 IMPLANT
CLOTH BEACON ORANGE TIMEOUT ST (SAFETY) ×3 IMPLANT
DRAPE CAMERA CLOSED 9X96 (DRAPES) ×3 IMPLANT
GLOVE BIO SURGEON STRL SZ 6 (GLOVE) ×3 IMPLANT
GLOVE BIO SURGEON STRL SZ7.5 (GLOVE) ×3 IMPLANT
GLOVE INDICATOR 6.5 STRL GRN (GLOVE) ×3 IMPLANT
GOWN STRL REUS W/ TWL LRG LVL3 (GOWN DISPOSABLE) ×1 IMPLANT
GOWN STRL REUS W/ TWL XL LVL3 (GOWN DISPOSABLE) ×1 IMPLANT
GOWN STRL REUS W/TWL LRG LVL3 (GOWN DISPOSABLE) ×2
GOWN STRL REUS W/TWL XL LVL3 (GOWN DISPOSABLE) ×2
NDL SAFETY ECLIPSE 18X1.5 (NEEDLE) ×1 IMPLANT
NEEDLE HYPO 18GX1.5 SHARP (NEEDLE) ×2
PACK CYSTOSCOPY (CUSTOM PROCEDURE TRAY) ×3 IMPLANT
SYR 20CC LL (SYRINGE) ×3 IMPLANT
WATER STERILE IRR 3000ML UROMA (IV SOLUTION) ×3 IMPLANT

## 2014-03-15 NOTE — Anesthesia Postprocedure Evaluation (Signed)
  Anesthesia Post-op Note  Patient: Annette Ellison  Procedure(s) Performed: Procedure(s) (LRB): CYSTO/HYDRODISTENSION OF BLADDER/INSTALLATION of marcaine and pyridium (N/A)  Patient Location: PACU  Anesthesia Type: General  Level of Consciousness: awake and alert   Airway and Oxygen Therapy: Patient Spontanous Breathing  Post-op Pain: mild  Post-op Assessment: Post-op Vital signs reviewed, Patient's Cardiovascular Status Stable, Respiratory Function Stable, Patent Airway and No signs of Nausea or vomiting  Last Vitals:  Filed Vitals:   03/15/14 0830  BP: 96/53  Pulse: 75  Temp:   Resp: 12    Post-op Vital Signs: stable   Complications: No apparent anesthesia complications

## 2014-03-15 NOTE — Discharge Instructions (Signed)
I have reviewed discharge instructions in detail with the patient. They will follow-up with me or their physician as scheduled. My nurse will also be calling the patients as per protocol.  °Post Anesthesia Home Care Instructions ° °Activity: °Get plenty of rest for the remainder of the day. A responsible adult should stay with you for 24 hours following the procedure.  °For the next 24 hours, DO NOT: °-Drive a car °-Operate machinery °-Drink alcoholic beverages °-Take any medication unless instructed by your physician °-Make any legal decisions or sign important papers. ° °Meals: °Start with liquid foods such as gelatin or soup. Progress to regular foods as tolerated. Avoid greasy, spicy, heavy foods. If nausea and/or vomiting occur, drink only clear liquids until the nausea and/or vomiting subsides. Call your physician if vomiting continues. ° °Special Instructions/Symptoms: °Your throat may feel dry or sore from the anesthesia or the breathing tube placed in your throat during surgery. If this causes discomfort, gargle with warm salt water. The discomfort should disappear within 24 hours. °CYSTOSCOPY HOME CARE INSTRUCTIONS ° °Activity: °Rest for the remainder of the day.  Do not drive or operate equipment today.  You may resume normal activities in one to two days as instructed by your physician.  ° °Meals: °Drink plenty of liquids and eat light foods such as gelatin or soup this evening.  You may return to a normal meal plan tomorrow. ° °Return to Work: °You may return to work in one to two days or as instructed by your physician. ° °Special Instructions / Symptoms: °Call your physician if any of these symptoms occur: ° ° -persistent or heavy bleeding ° -bleeding which continues after first few urination ° -large blood clots that are difficult to pass ° -urine stream diminishes or stops completely ° -fever equal to or higher than 101 degrees Farenheit. ° -cloudy urine with a strong, foul odor ° -severe  pain ° °Females should always wipe from front to back after elimination.  You may feel some burning pain when you urinate.  This should disappear with time.  Applying moist heat to the lower abdomen or a hot tub bath may help relieve the pain. \ ° °Follow-Up / Date of Return Visit to Your Physician:  *** °Call for an appointment to arrange follow-up. ° °Patient Signature:  ________________________________________________________ ° °Nurse's Signature:  ________________________________________________________ ° °

## 2014-03-15 NOTE — Interval H&P Note (Signed)
History and Physical Interval Note:  03/15/2014 7:16 AM  Annette Ellison  has presented today for surgery, with the diagnosis of PELVIC PAIN  The various methods of treatment have been discussed with the patient and family. After consideration of risks, benefits and other options for treatment, the patient has consented to  Procedure(s): CYSTO/HYDRODISTENSION OF BLADDER/INSTALLATION  (N/A) as a surgical intervention .  The patient's history has been reviewed, patient examined, no change in status, stable for surgery.  I have reviewed the patient's chart and labs.  Questions were answered to the patient's satisfaction.     Obie Silos A

## 2014-03-15 NOTE — Progress Notes (Signed)
Waiting for Dr. Myrtice LauthMacdiramid to come see patient for discharge

## 2014-03-15 NOTE — Op Note (Signed)
Preoperative diagnosis: Pelvic pain Postoperative diagnosis: Pelvic pain Surgery: Bladder hydrodistention and bladder installation therapy and cystoscopy Surgeon: Dr. Lorin PicketScott Makesha Belitz  The patient has the above diagnoses and consented above procedure. Extra care was taken with leg positioning. Preoperative antibiotics were given.  21 French cystoscope was utilized. The bladder mucosa and trigone were normal. Bladder was hydrodistended to 700 mL.  Bladder was emptied. Reinspection there was no glomerulations or findings in keeping with a diagnosis of interstitial cystitis. Bladder was emptied. As a separate procedure with a red rubber catheter instilled 15 cc of 0.5% Marcaine was 400 mg a pretty  This is scope probably does not have interstitial cystitis. My usual protocol be recommended or suggested

## 2014-03-15 NOTE — Anesthesia Procedure Notes (Signed)
Procedure Name: LMA Insertion Date/Time: 03/15/2014 7:37 AM Performed by: Tyrone Nine Pre-anesthesia Checklist: Patient identified, Timeout performed, Emergency Drugs available, Suction available and Patient being monitored Patient Re-evaluated:Patient Re-evaluated prior to inductionOxygen Delivery Method: Circle system utilized Preoxygenation: Pre-oxygenation with 100% oxygen Intubation Type: IV induction Ventilation: Mask ventilation without difficulty LMA: LMA inserted LMA Size: 4.0 Laser Tube: Cuffed inflated with minimal occlusive pressure - saline Number of attempts: 1 Placement Confirmation: positive ETCO2 Tube secured with: Tape

## 2014-03-15 NOTE — Anesthesia Preprocedure Evaluation (Addendum)
Anesthesia Evaluation  Patient identified by MRN, date of birth, ID band Patient awake    Reviewed: Allergy & Precautions, H&P , NPO status , Patient's Chart, lab work & pertinent test results  Airway Mallampati: II TM Distance: >3 FB Neck ROM: Full    Dental no notable dental hx.    Pulmonary neg pulmonary ROS,  breath sounds clear to auscultation  Pulmonary exam normal       Cardiovascular Exercise Tolerance: Good negative cardio ROS  Rhythm:Regular Rate:Normal     Neuro/Psych negative neurological ROS  negative psych ROS   GI/Hepatic negative GI ROS, Neg liver ROS,   Endo/Other  negative endocrine ROS  Renal/GU negative Renal ROS  negative genitourinary   Musculoskeletal negative musculoskeletal ROS (+)   Abdominal   Peds negative pediatric ROS (+)  Hematology negative hematology ROS (+)   Anesthesia Other Findings   Reproductive/Obstetrics negative OB ROS                          Anesthesia Physical Anesthesia Plan  ASA: II  Anesthesia Plan: General   Post-op Pain Management:    Induction: Intravenous  Airway Management Planned: LMA  Additional Equipment:   Intra-op Plan:   Post-operative Plan:   Informed Consent: I have reviewed the patients History and Physical, chart, labs and discussed the procedure including the risks, benefits and alternatives for the proposed anesthesia with the patient or authorized representative who has indicated his/her understanding and acceptance.   Dental advisory given  Plan Discussed with: CRNA, Surgeon and Anesthesiologist  Anesthesia Plan Comments:        Anesthesia Quick Evaluation

## 2014-03-15 NOTE — Transfer of Care (Signed)
Immediate Anesthesia Transfer of Care Note  Patient: Annette Ellison  Procedure(s) Performed: Procedure(s): CYSTO/HYDRODISTENSION OF BLADDER/INSTALLATION of marcaine and pyridium (N/A)  Patient Location: PACU  Anesthesia Type:General  Level of Consciousness: awake, alert , oriented and patient cooperative  Airway & Oxygen Therapy: Patient Spontanous Breathing and Patient connected to nasal cannula oxygen  Post-op Assessment: Report given to PACU RN and Post -op Vital signs reviewed and stable  Post vital signs: Reviewed and stable  Complications: No apparent anesthesia complications

## 2014-03-19 ENCOUNTER — Encounter (HOSPITAL_BASED_OUTPATIENT_CLINIC_OR_DEPARTMENT_OTHER): Payer: Self-pay | Admitting: Urology

## 2014-08-22 IMAGING — US US ART/VEN ABD/PELV/SCROTUM DOPPLER LTD
1 series · 13 of 25 positions shown · non-contrast
Comparison: CT, [DATE].  The

CLINICAL DATA: Pelvic pain. Symptoms began yesterday. History of a
hysterectomy and left oophorectomy. History of polycystic ovarian
syndrome.

EXAM:
TRANSABDOMINAL AND TRANSVAGINAL ULTRASOUND OF PELVIS
DOPPLER ULTRASOUND OF OVARIES
TECHNIQUE: Both transabdominal and transvaginal ultrasound examinations of the
pelvis were performed. Transabdominal technique was performed for
global imaging of the pelvis including uterus, ovaries, adnexal
regions, and pelvic cul-de-sac.
It was necessary to proceed with endovaginal exam following the
transabdominal exam to visualize the right ovary to better
advantage. Color and duplex Doppler ultrasound was utilized to
evaluate blood flow to the ovaries.

[Series 1: us art/ven abd/pelv/scrotum doppler ltd · 0.20mm/px · 13 of 72 slices shown]
[im 1/72]
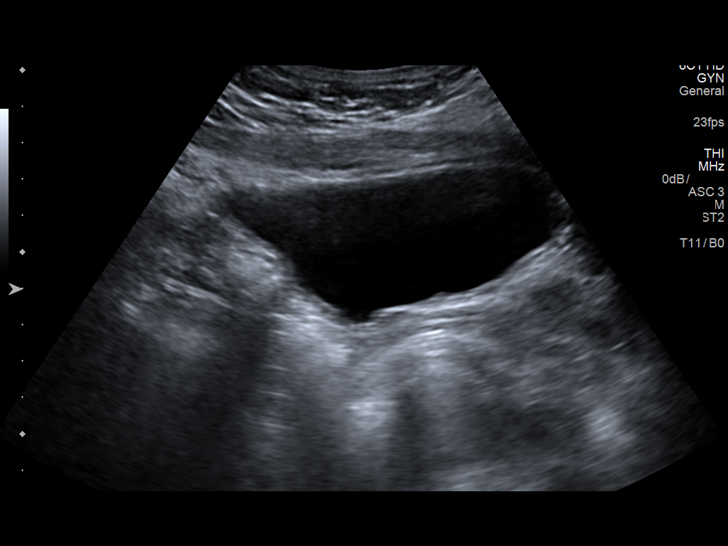
[im 6/72]
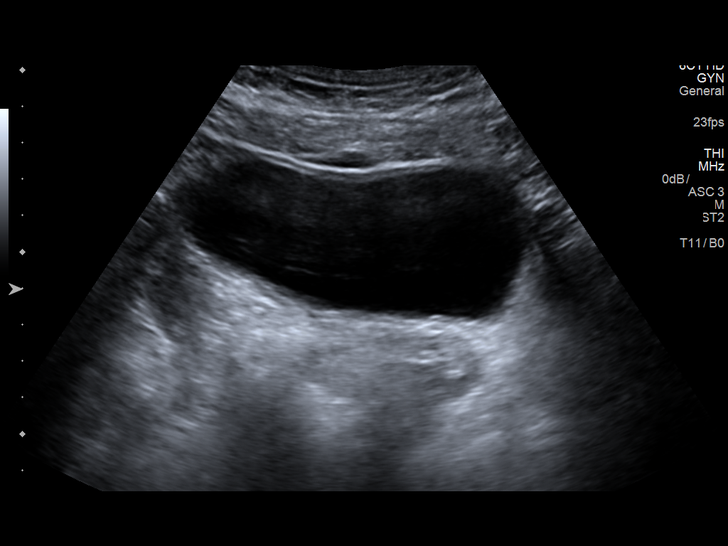
[im 12/72]
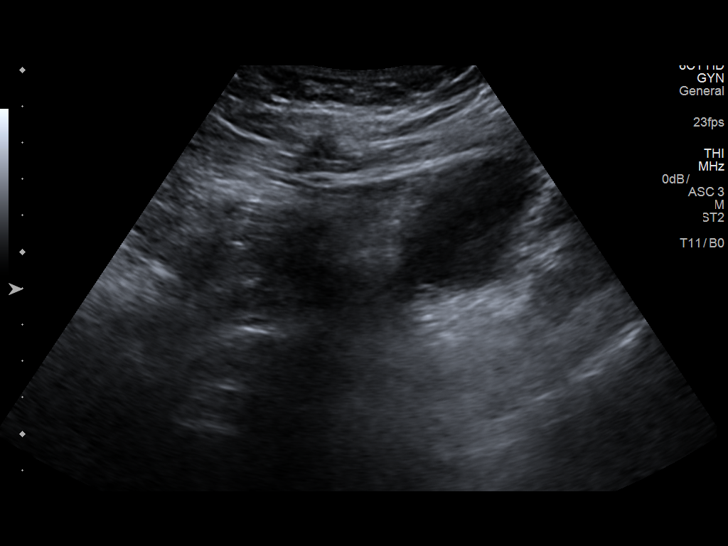
[im 18/72]
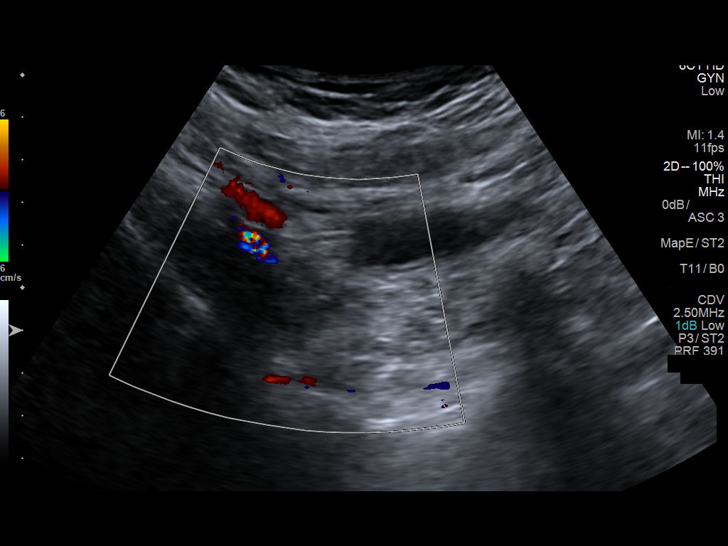
[im 24/72]
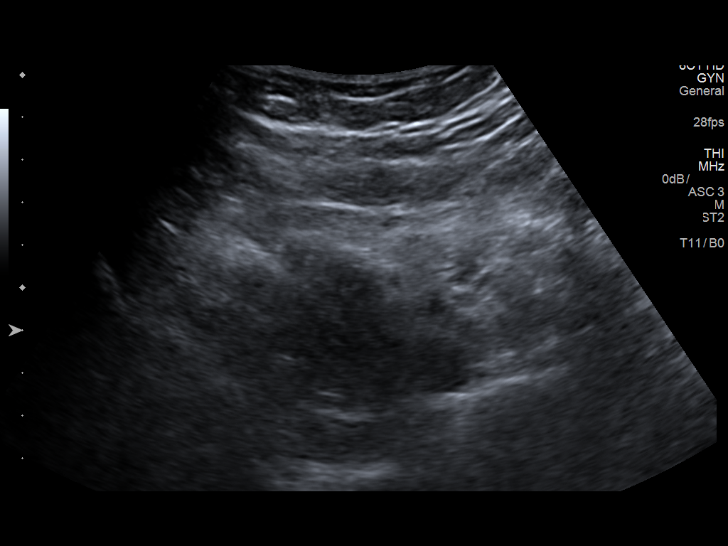
[im 30/72]
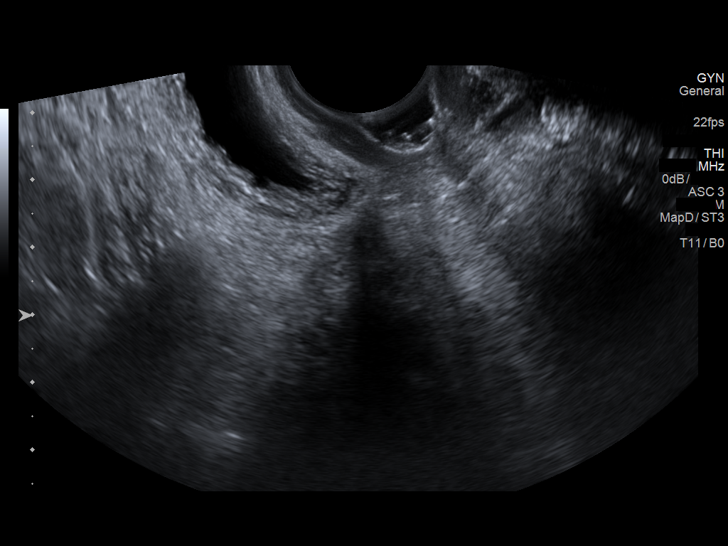
[im 36/72]
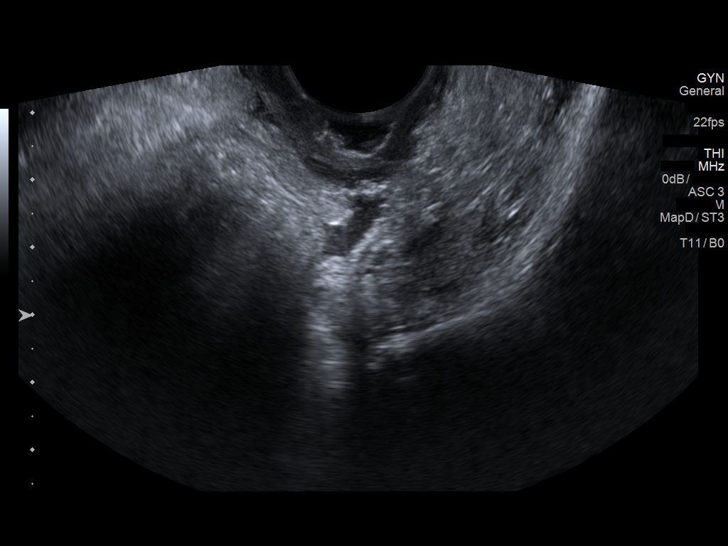
[im 42/72]
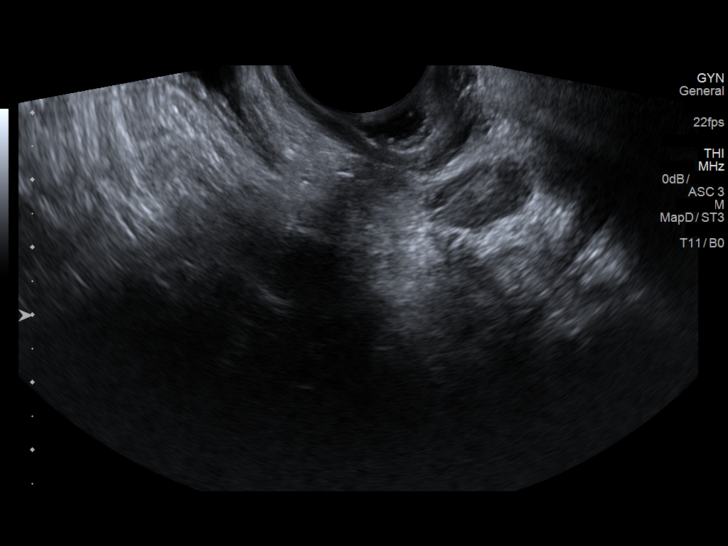
[im 48/72]
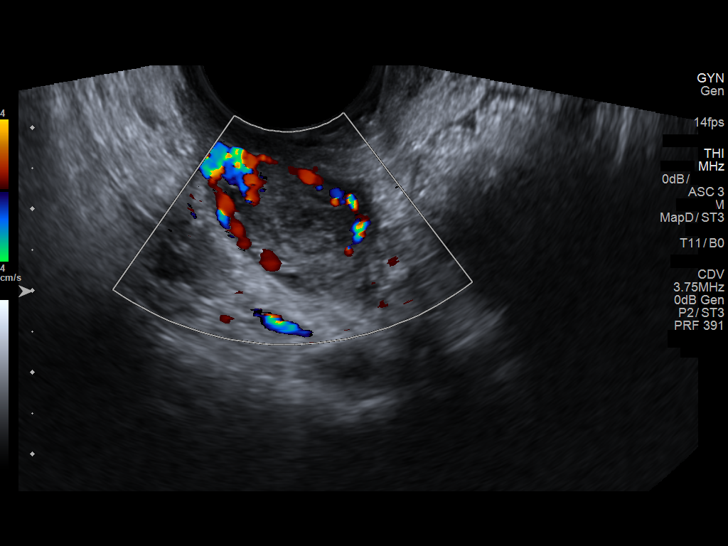
[im 54/72]
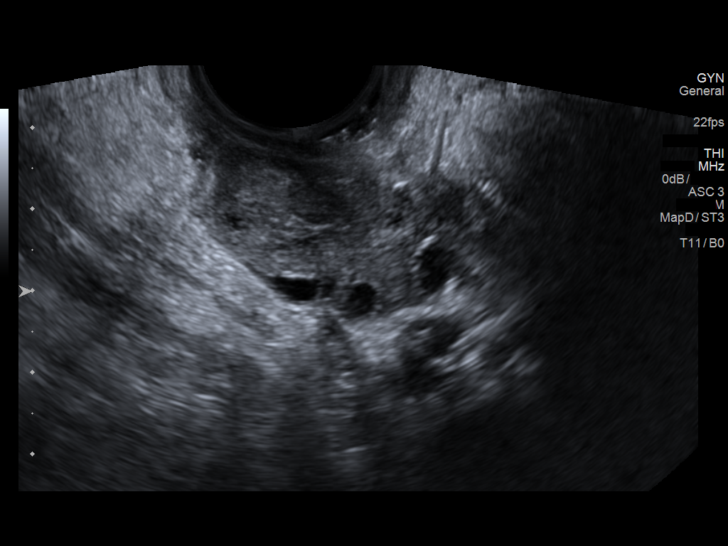
[im 60/72]
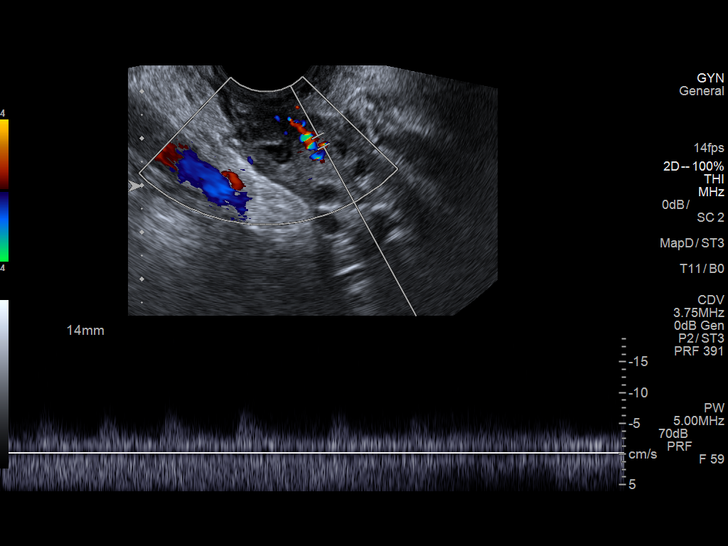
[im 66/72]
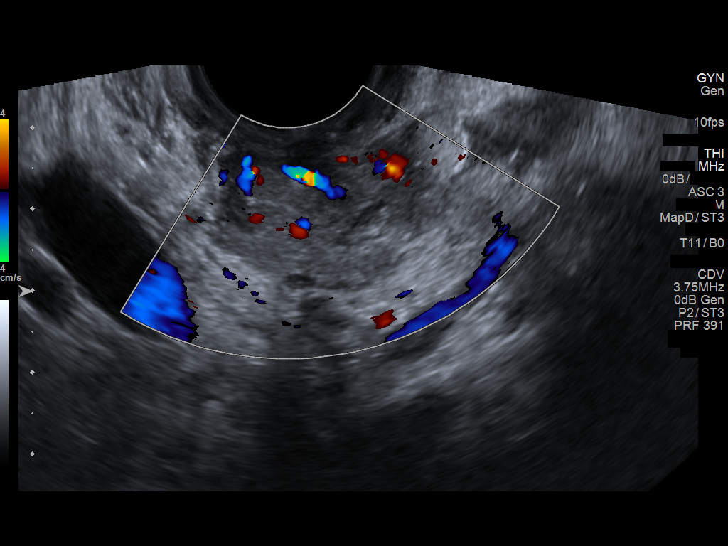
[im 72/72]
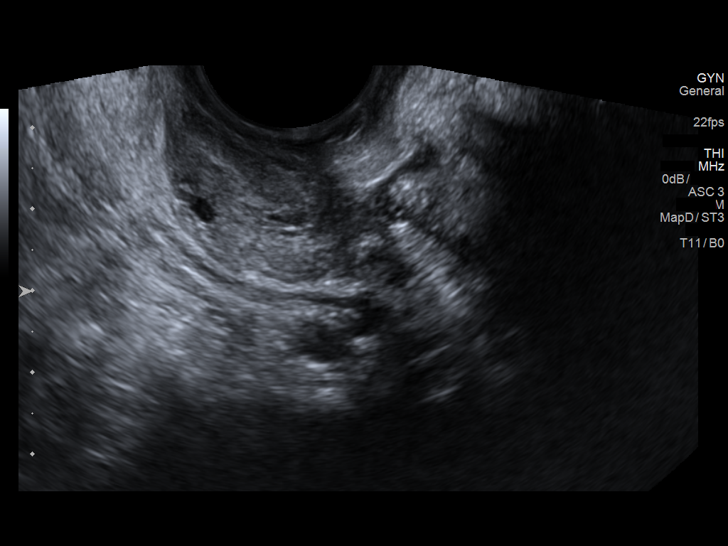

[13 of 25 positions shown; findings below may reference images not displayed]

FINDINGS: Uterus

Surgically absent

Right ovary

Measurements: 3.8 x 1.6 x 2.7 cm. Complex ovoid masslike area
measuring 2.1 x 1.3 x 2.2 cm, consistent with an involuting or
hemorrhagic cyst, consistent with the findings on the current CT.
Normal right ovarian blood flow. No right adnexal masses.

Left ovary

Surgically absent.  No left adnexal masses.

Pulsed Doppler evaluation of the right ovary demonstrates normal
low-resistance arterial and venous waveforms.

Other findings

Small amount pelvic free fluid.
IMPRESSION: 1. Status post hysterectomy and left oophorectomy.
2. No pelvic masses.
3. 2.2 cm complex right ovarian cyst likely an involuting follicular
cyst or hemorrhagic cyst. Small amount pelvic free fluid. Findings
support partial rupture of an ovarian cyst as cause of the patient's
pain.
4. No evidence of ovarian torsion.

## 2015-06-18 ENCOUNTER — Encounter: Payer: Self-pay | Admitting: Internal Medicine

## 2015-06-18 ENCOUNTER — Ambulatory Visit (INDEPENDENT_AMBULATORY_CARE_PROVIDER_SITE_OTHER): Payer: PRIVATE HEALTH INSURANCE | Admitting: Internal Medicine

## 2015-06-18 ENCOUNTER — Other Ambulatory Visit (INDEPENDENT_AMBULATORY_CARE_PROVIDER_SITE_OTHER): Payer: PRIVATE HEALTH INSURANCE

## 2015-06-18 VITALS — BP 122/70 | HR 81 | Ht 63.0 in | Wt 129.0 lb

## 2015-06-18 DIAGNOSIS — G4733 Obstructive sleep apnea (adult) (pediatric): Secondary | ICD-10-CM | POA: Diagnosis not present

## 2015-06-18 DIAGNOSIS — G4700 Insomnia, unspecified: Secondary | ICD-10-CM

## 2015-06-18 DIAGNOSIS — E0590 Thyrotoxicosis, unspecified without thyrotoxic crisis or storm: Secondary | ICD-10-CM

## 2015-06-18 LAB — TSH: TSH: 0.46 u[IU]/mL (ref 0.35–4.50)

## 2015-06-18 NOTE — Assessment & Plan Note (Addendum)
She describes difficulty initiating sleep with sleep latency of one hour not benefiting from melatonin. She also describes difficulty maintaining sleep which is harder to distinguish from possible prior to her disturbance. Her weight loss and description of feeling too warm at night might possibly reflect hyperthyroidism. TSH today is at the low end of normal. I will bring this to her primary physician's attention to decide if it is medically important.  TSH 06/18/2015  0.46(0.35-4.50)

## 2015-06-18 NOTE — Assessment & Plan Note (Signed)
Moderate probability because of witnessed apnea and loud snoring with long palate. This may contribute to her awareness of restless, non-refreshing sleep. Plan-sleep study

## 2015-06-18 NOTE — Progress Notes (Signed)
[birth year]-29 yoF never smoker [facility]; trouble falling asleep and staying asleep X2 years; no sleep study.  Epworth Score 13.  Partner has told her she shores loudly and has recorded her. No ENT surgery. Daytime drowsiness sometimes. "Always feels fatigued". She complains of difficulty initiating and maintaining sleep with sleep latency of one hour and waking at least 3 times. Bedtime between 9:30 and 10:30 PM, up at 5:45 AM. Has lost about 46 pounds over the last 2 years. She complains of feeling too warm at night although bedroom temp is 68. Kids say they are cold in the house. Doesn't know about prior thyroid testing. Melatonin did not help sleep. Topamax helps headaches but she still wakes in the morning with headache on many days. No caffeine. 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  Prior to Admission medications   Medication Sig Start Date End Date Taking? Authorizing Provider  Cholecalciferol (VITAMIN D3) 1000 UNITS CAPS Take 1 capsule by mouth daily.   Yes Historical Provider, MD  tiZANidine (ZANAFLEX) 4 MG capsule Take 4 mg by mouth 2 (two) times daily as needed for muscle spasms.   Yes Historical Provider, MD  topiramate (TOPAMAX) 100 MG tablet Take 150 mg by mouth every evening.    Yes Historical Provider, MD   Past Medical History  Diagnosis Date  . HA (headache)   . Pelvic pain in female   . Urgency of urination   . Frequency of urination   . Wears contact lenses   . GERD (gastroesophageal reflux disease)   . Elevated cholesterol   . High blood pressure   . Allergic rhinitis   . Osteoarthritis   . Depression    Past Surgical History  Procedure Laterality Date  . Cesarean section  08-12-2010  . Laparoscopy w/ ovarian cystectomy  2010  . Abdominal hysterectomy  2012    W/  LEFT SALPINGOOPHORECTOMY  . Cysto with hydrodistension N/A 03/15/2014    Procedure: CYSTO/HYDRODISTENSION OF BLADDER/INSTALLATION of marcaine and  pyridium;  Surgeon: Martina SinnerScott A MacDiarmid, MD;  [facility] ;  Service: Urology;  Laterality: N/A;   Family History  Problem Relation Age of Onset  . Migraines Mother   . High Cholesterol Mother   . High blood pressure Mother   . Mental illness    . Stroke    . Diabetes     Social History   Social History  . Marital Status: Single    Spouse Name: N/A  . Number of Children: 2  . Years of Education: College   Occupational History  . [employer]   Social History Main Topics  . Smoking status: Never Smoker   . Smokeless tobacco: Never Used  . Alcohol Use: 0.6 oz/week    1 Glasses of wine per week     Comment: OCC  . Drug Use: No  . Sexual Activity: Not on file   Other Topics Concern  . Not on file   Social History Narrative   Patient lives at home with her partner. Patient works Full time at [employer]    [employer]   Right handed   Caffeine two cups of coffee daily   ROS-see HPI   Negative unless "+" Constitutional:    weight loss, night sweats, fevers, chills, fatigue, lassitude. HEENT:    headaches, difficulty swallowing, tooth/dental problems, sore throat,       sneezing, itching, ear ache, nasal congestion, post nasal drip, snoring CV:  chest pain, orthopnea, PND, swelling in lower extremities, anasarca,                                                    dizziness, palpitations Resp:   shortness of breath with exertion or at rest.                productive cough,   non-productive cough, coughing up of blood.              change in color of mucus.  wheezing.   Skin:    rash or lesions. GI:  No-   heartburn, indigestion, abdominal pain, nausea, vomiting,  GU:  MS:   joint pain, stiffness, decreased range of motion, back pain. Neuro-     nothing unusual Psych:  change in mood or affect.  depression or anxiety.   memory loss.  OBJ- Physical Exam General- Alert, Oriented, Affect-appropriate, Distress- none acute Skin- rash-none,  lesions- none, excoriation- none Lymphadenopathy- none Head- atraumatic            Eyes- Gross vision intact, PERRLA, conjunctivae and secretions clear            Ears- Hearing, canals-normal            Nose- Clear, no-Septal dev, mucus, polyps, erosion, perforation             Throat- Mallampati III-IV , mucosa clear , drainage- none, tonsils- atrophic Neck- flexible , trachea midline, no stridor , thyroid nl, carotid no bruit Chest - symmetrical excursion , unlabored           Heart/CV- RRR , no murmur , no gallop  , no rub, nl s1 s2                           - JVD- none , edema- none, stasis changes- none, varices- none           Lung- clear to P&A, wheeze- none, cough- none , dullness-none, rub- none           Chest wall-  Abd-  Br/ Gen/ Rectal- Not done, not indicated Extrem- cyanosis- none, clubbing, none, atrophy- none, strength- nl Neuro- grossly intact to observation

## 2015-06-18 NOTE — Patient Instructions (Addendum)
Order- Schedule unattended home sleep test    Dx OSA, Insomnia  Order- lab     TSH      Dx hyperthyroid

## 2015-07-16 ENCOUNTER — Ambulatory Visit: Payer: PRIVATE HEALTH INSURANCE | Admitting: Internal Medicine

## 2015-07-18 ENCOUNTER — Ambulatory Visit (INDEPENDENT_AMBULATORY_CARE_PROVIDER_SITE_OTHER): Payer: PRIVATE HEALTH INSURANCE | Admitting: Internal Medicine

## 2015-07-18 ENCOUNTER — Encounter (INDEPENDENT_AMBULATORY_CARE_PROVIDER_SITE_OTHER): Payer: Self-pay

## 2015-07-18 ENCOUNTER — Encounter: Payer: Self-pay | Admitting: Internal Medicine

## 2015-07-18 VITALS — BP 116/72 | HR 79 | Ht 63.0 in | Wt 135.0 lb

## 2015-07-18 DIAGNOSIS — G4733 Obstructive sleep apnea (adult) (pediatric): Secondary | ICD-10-CM | POA: Diagnosis not present

## 2015-07-18 DIAGNOSIS — G4700 Insomnia, unspecified: Secondary | ICD-10-CM

## 2015-07-18 MED ORDER — ZOLPIDEM TARTRATE 5 MG PO TABS: ORAL_TABLET | ORAL | Status: DC

## 2015-07-18 NOTE — Patient Instructions (Signed)
Script to try ambien 5 mg- 1/2 or 1 tab at bedtime for sleep if needed  Please call as needed

## 2015-07-18 NOTE — Progress Notes (Signed)
[birth year]-29 yoF never smoker referred [facility]; trouble falling asleep and staying asleep X2 years; no sleep study.  Epworth Score 13.  Partner has told her she shores loudly and has recorded her. No ENT surgery. Daytime drowsiness sometimes. "Always feels fatigued". She complains of difficulty initiating and maintaining sleep with sleep latency of one hour and waking at least 3 times. Bedtime between 9:30 and 10:30 PM, up at 5:45 AM. Has lost about 46 pounds over the last 2 years. She complains of feeling too warm at night although bedroom temp is 68. Kids say they are cold in the house. Doesn't know about prior thyroid testing. Melatonin did not help sleep. Topamax helps headaches but she still wakes in the morning with headache on many days. No caffeine. 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  07/18/15- 29 yoF never smoker referred [facility]; trouble falling asleep and staying asleep X2 years; no sleep study.  Epworth Score 13.  FOLLOWS FOR: sleep study review. concerned about weight gain. HST 07/02/15- WNL AHI 2.1/ hr, desat to 91%. Her main complaint is difficulty initiating and maintaining sleep. Available sleep data does not suggest a cause for headache.  ROS-see HPI   Negative unless "+" Constitutional:    weight loss, night sweats, fevers, chills, fatigue, lassitude. HEENT:    + headaches, difficulty swallowing, tooth/dental problems, sore throat,       sneezing, itching, ear ache, nasal congestion, post nasal drip, snoring CV:    chest pain, orthopnea, PND, swelling in lower extremities, anasarca,                                                    dizziness, palpitations Resp:   shortness of breath with exertion or at rest.                productive cough,   non-productive cough, coughing up of blood.              change in color of mucus.  wheezing.   Skin:    rash or lesions. GI:  No-    heartburn, indigestion, abdominal pain, nausea, vomiting,  GU:  MS:   joint pain, stiffness, decreased range of motion, back pain. Neuro-     nothing unusual Psych:  change in mood or affect.  depression or anxiety.   memory loss.  OBJ- Physical Exam General- Alert, Oriented, Affect-appropriate, Distress- none acute, appears well, relaxed and pleasant Skin- rash-none, lesions- none, excoriation- none Lymphadenopathy- none Head- atraumatic            Eyes- Gross vision intact, PERRLA, conjunctivae and secretions clear            Ears- Hearing, canals-normal            Nose- Clear, no-Septal dev, mucus, polyps, erosion, perforation             Throat- Mallampati III-IV , mucosa clear , drainage- none, tonsils- atrophic Neck- flexible , trachea midline, no stridor , thyroid nl, carotid no bruit Chest - symmetrical excursion , unlabored           Heart/CV- RRR , no murmur , no gallop  , no rub, nl s1 s2                           -  JVD- none , edema- none, stasis changes- none, varices- none           Lung- clear to P&A, wheeze- none, cough- none , dullness-none, rub- none           Chest wall-  Abd-  Br/ Gen/ Rectal- Not done, not indicated Extrem- cyanosis- none, clubbing, none, atrophy- none, strength- nl Neuro- grossly intact to observation

## 2015-07-21 NOTE — Assessment & Plan Note (Signed)
Pattern of difficulty initiating and maintaining sleep most consistent with stress and sleep hygiene. Plan-sleep hygiene discussed. Try Ambien to see if that impacts daytime complaint of headache by helping to consolidate nighttime sleep in the absence of sleep apnea.

## 2015-07-21 NOTE — Assessment & Plan Note (Signed)
This diagnosis is ruled out by normal sleep study

## 2015-10-16 ENCOUNTER — Ambulatory Visit: Payer: PRIVATE HEALTH INSURANCE | Admitting: Internal Medicine

## 2015-11-18 ENCOUNTER — Ambulatory Visit (INDEPENDENT_AMBULATORY_CARE_PROVIDER_SITE_OTHER): Payer: PRIVATE HEALTH INSURANCE | Admitting: Internal Medicine

## 2015-11-18 ENCOUNTER — Encounter: Payer: Self-pay | Admitting: Internal Medicine

## 2015-11-18 VITALS — BP 98/66 | HR 75 | Ht 63.0 in | Wt 135.6 lb

## 2015-11-18 DIAGNOSIS — G4700 Insomnia, unspecified: Secondary | ICD-10-CM

## 2015-11-18 MED ORDER — ESZOPICLONE 2 MG PO TABS: 2.0000 mg | ORAL_TABLET | Freq: Every evening | ORAL | Status: DC | PRN

## 2015-11-18 NOTE — Progress Notes (Signed)
[birth year]-29 yoF never smoker referred [facility]; trouble falling asleep and staying asleep X2 years; no sleep study.  Epworth Score 13.  Partner has told her she shores loudly and has recorded her. No ENT surgery. Daytime drowsiness sometimes. "Always feels fatigued". She complains of difficulty initiating and maintaining sleep with sleep latency of one hour and waking at least 3 times. Bedtime between 9:30 and 10:30 PM, up at 5:45 AM. Has lost about 46 pounds over the last 2 years. She complains of feeling too warm at night although bedroom temp is 68. Kids say they are cold in the house. Doesn't know about prior thyroid testing. Melatonin did not help sleep. Topamax helps headaches but she still wakes in the morning with headache on many days. No caffeine. 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  07/18/15- 29 yoF never smoker referred [facility]; trouble falling asleep and staying asleep X2 years; no sleep study.  Epworth Score 13.  FOLLOWS FOR: sleep study review. concerned about weight gain. HST 07/02/15- WNL AHI 2.1/ hr, desat to 91%. Her main complaint is difficulty initiating and maintaining sleep. Available sleep data does not suggest a cause for headache.  11/18/2015-30 year old female never smoker followed for insomnia FOLLOW FOR: Insomnia.  Ambien working some nights, but not every night, she has been taking the Ambien every night, the nights that she doesn't sleep, she gets a headache.  Pt has noisy neighbors that have just moved in.     ROS-see HPI   Negative unless "+" Constitutional:    weight loss, night sweats, fevers, chills, fatigue, lassitude. HEENT:    + headaches, difficulty swallowing, tooth/dental problems, sore throat,       sneezing, itching, ear ache, nasal congestion, post nasal drip, snoring CV:    chest pain, orthopnea, PND, swelling in lower extremities, anasarca,                                                     dizziness, palpitations Resp:   shortness of breath with exertion or at rest.                productive cough,   non-productive cough, coughing up of blood.              change in color of mucus.  wheezing.   Skin:    rash or lesions. GI:  No-   heartburn, indigestion, abdominal pain, nausea, vomiting,  GU:  MS:   joint pain, stiffness, decreased range of motion, back pain. Neuro-     nothing unusual Psych:  change in mood or affect.  depression or anxiety.   memory loss.  OBJ- Physical Exam General- Alert, Oriented, Affect-appropriate, Distress- none acute, appears well, relaxed and pleasant Skin- rash-none, lesions- none, excoriation- none Lymphadenopathy- none Head- atraumatic            Eyes- Gross vision intact, PERRLA, conjunctivae and secretions clear            Ears- Hearing, canals-normal            Nose- Clear, no-Septal dev, mucus, polyps, erosion, perforation             Throat- Mallampati III-IV , mucosa clear , drainage- none, tonsils- atrophic Neck- flexible , trachea midline, no stridor , thyroid  nl, carotid no bruit Chest - symmetrical excursion , unlabored           Heart/CV- RRR , no murmur , no gallop  , no rub, nl s1 s2                           - JVD- none , edema- none, stasis changes- none, varices- none           Lung- clear to P&A, wheeze- none, cough- none , dullness-none, rub- none           Chest wall-  Abd-  Br/ Gen/ Rectal- Not done, not indicated Extrem- cyanosis- none, clubbing, none, atrophy- none, strength- nl Neuro- grossly intact to observation

## 2015-11-18 NOTE — Patient Instructions (Signed)
Script printed to try Lunesta for sleep instead of ambien, as discussed  Please call as needed

## 2016-02-18 ENCOUNTER — Encounter (HOSPITAL_COMMUNITY): Payer: Self-pay | Admitting: *Deleted

## 2016-02-18 ENCOUNTER — Emergency Department (HOSPITAL_COMMUNITY): Payer: PRIVATE HEALTH INSURANCE

## 2016-02-18 ENCOUNTER — Emergency Department (HOSPITAL_COMMUNITY)
Admission: EM | Admit: 2016-02-18 | Discharge: 2016-02-18 | Disposition: A | Discharge status: Home / Self Care | Payer: PRIVATE HEALTH INSURANCE | Attending: Emergency Medicine | Admitting: Emergency Medicine

## 2016-02-18 DIAGNOSIS — R1031 Right lower quadrant pain: Secondary | ICD-10-CM | POA: Diagnosis present

## 2016-02-18 DIAGNOSIS — N83201 Unspecified ovarian cyst, right side: Secondary | ICD-10-CM | POA: Insufficient documentation

## 2016-02-18 DIAGNOSIS — I10 Essential (primary) hypertension: Secondary | ICD-10-CM | POA: Insufficient documentation

## 2016-02-18 DIAGNOSIS — R102 Pelvic and perineal pain: Secondary | ICD-10-CM

## 2016-02-18 LAB — COMPREHENSIVE METABOLIC PANEL
ALK PHOS: 59 U/L (ref 38–126)
ALT: 12 U/L — ABNORMAL LOW (ref 14–54)
AST: 11 U/L — ABNORMAL LOW (ref 15–41)
Albumin: 3.9 g/dL (ref 3.5–5.0)
Anion gap: 7 (ref 5–15)
BILIRUBIN TOTAL: 0.8 mg/dL (ref 0.3–1.2)
BUN: 14 mg/dL (ref 6–20)
CALCIUM: 8.9 mg/dL (ref 8.9–10.3)
CO2: 18 mmol/L — ABNORMAL LOW (ref 22–32)
Chloride: 113 mmol/L — ABNORMAL HIGH (ref 101–111)
Creatinine, Ser: 0.74 mg/dL (ref 0.44–1.00)
GFR calc non Af Amer: >60 mL/min (ref >60)
Glucose, Bld: 97 mg/dL (ref 65–99)
Potassium: 3.4 mmol/L — ABNORMAL LOW (ref 3.5–5.1)
SODIUM: 138 mmol/L (ref 135–145)
TOTAL PROTEIN: 6.9 g/dL (ref 6.5–8.1)

## 2016-02-18 LAB — URINALYSIS, ROUTINE W REFLEX MICROSCOPIC
Bilirubin Urine: NEGATIVE
Glucose, UA: NEGATIVE mg/dL
Hgb urine dipstick: NEGATIVE
Ketones, ur: NEGATIVE mg/dL
Leukocytes, UA: NEGATIVE
NITRITE: NEGATIVE
PROTEIN: NEGATIVE mg/dL
SPECIFIC GRAVITY, URINE: 1.019 (ref 1.005–1.030)
pH: 7 (ref 5.0–8.0)

## 2016-02-18 LAB — CBC
HCT: 41.9 % (ref 36.0–46.0)
HEMOGLOBIN: 14.3 g/dL (ref 12.0–15.0)
MCH: 30.6 pg (ref 26.0–34.0)
MCHC: 34.1 g/dL (ref 30.0–36.0)
MCV: 89.7 fL (ref 78.0–100.0)
Platelets: 245 10*3/uL (ref 150–400)
RBC: 4.67 MIL/uL (ref 3.87–5.11)
RDW: 12.7 % (ref 11.5–15.5)
WBC: 8.5 10*3/uL (ref 4.0–10.5)

## 2016-02-18 LAB — LIPASE, BLOOD: Lipase: 26 U/L (ref 11–51)

## 2016-02-18 IMAGING — CT CT ABD-PELV W/ CM
2 of 4 series · 17 of 46 positions shown, 19 images · IV contrast (iopamidol)
Comparison: None.

CLINICAL DATA: Right lower quadrant pain since yesterday.

EXAM:
CT ABDOMEN AND PELVIS WITH CONTRAST
TECHNIQUE: Multidetector CT imaging of the abdomen and pelvis was performed
using the standard protocol following bolus administration of
intravenous contrast.
CONTRAST:  100mL [5F] IOPAMIDOL ([5F]) INJECTION 61%

[Series 2: a/p w/ 5mm · axial · 0.71mm/px · z∈[+1373,+1783]mm · 14 of 92 slices shown, 16 images]
[im 5/92  soft-tissue]
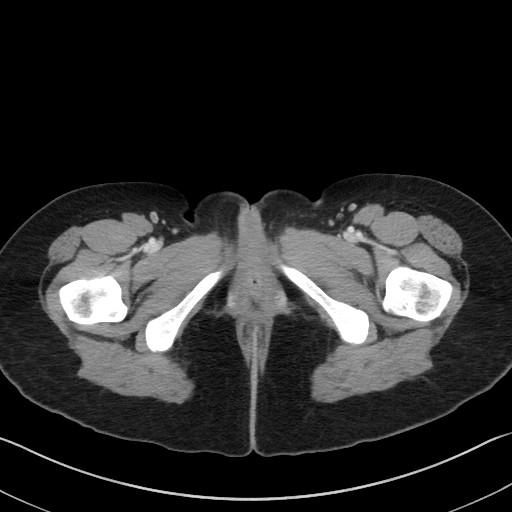
[im 5/92  bone]
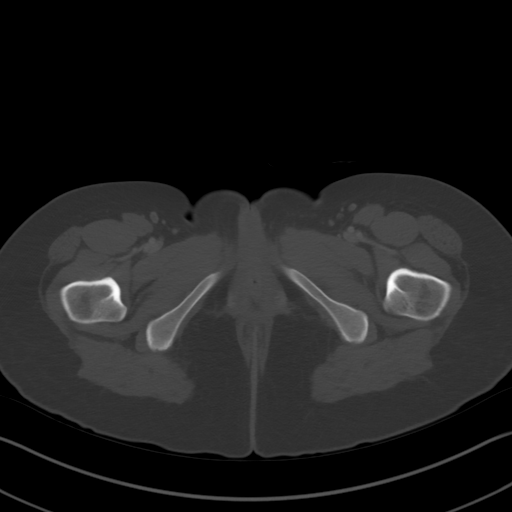
[im 10/92  soft-tissue]
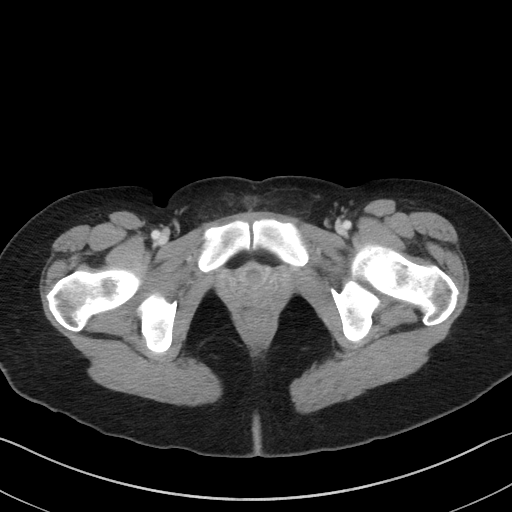
[im 20/92  soft-tissue]
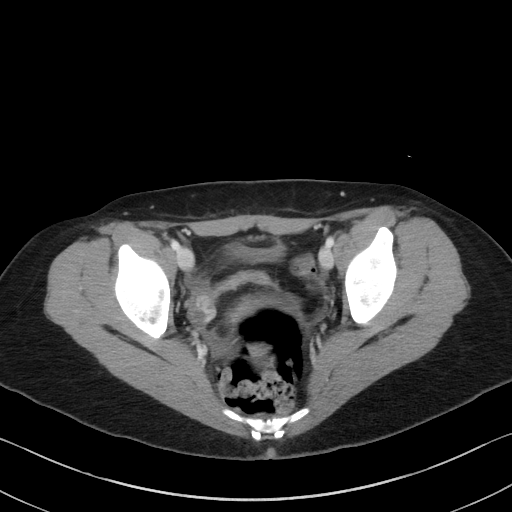
[im 24/92  soft-tissue]
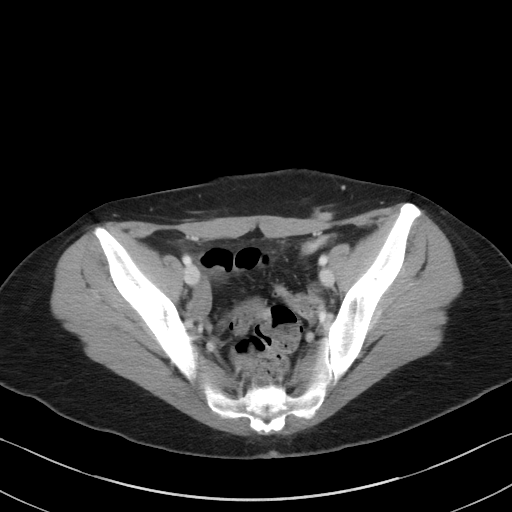
[im 29/92  soft-tissue]
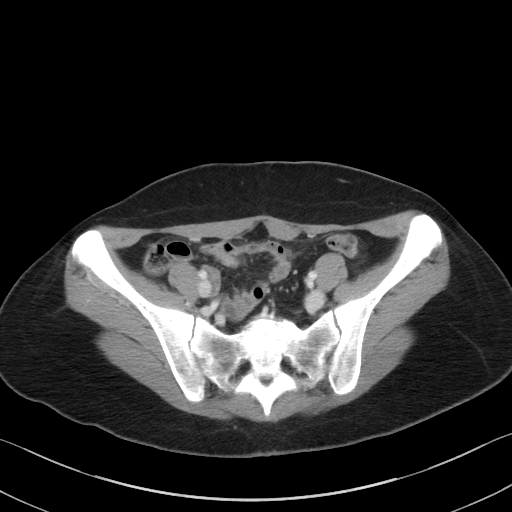
[im 39/92  soft-tissue]
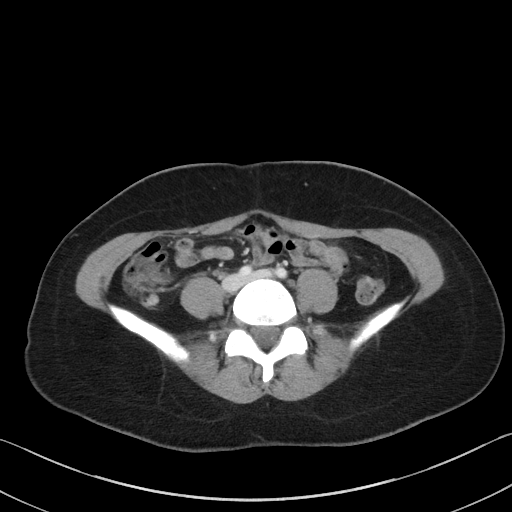
[im 44/92  soft-tissue]
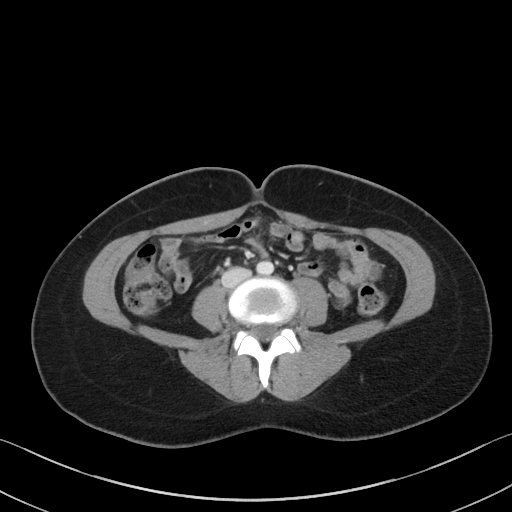
[im 48/92  soft-tissue]
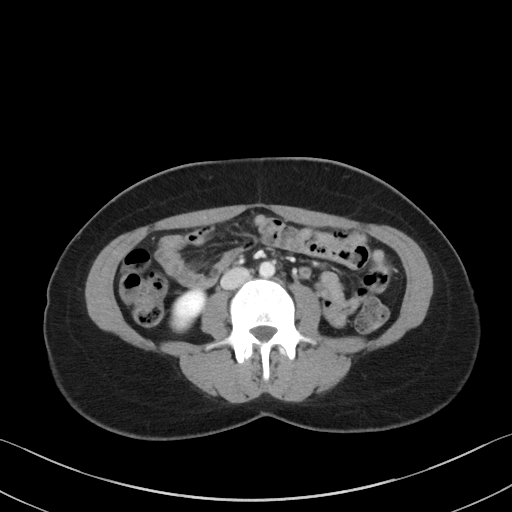
[im 53/92  soft-tissue]
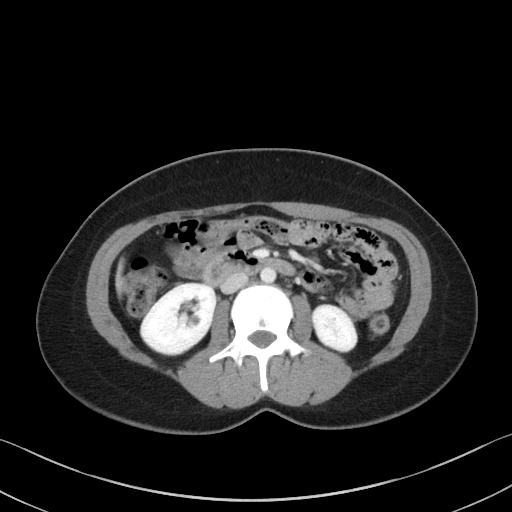
[im 53/92  bone]
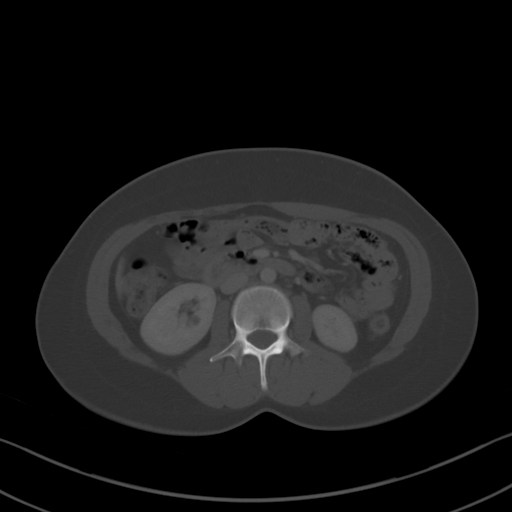
[im 63/92  soft-tissue]
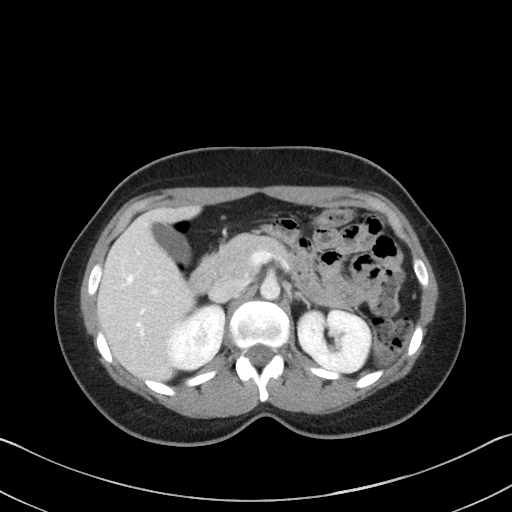
[im 68/92  soft-tissue]
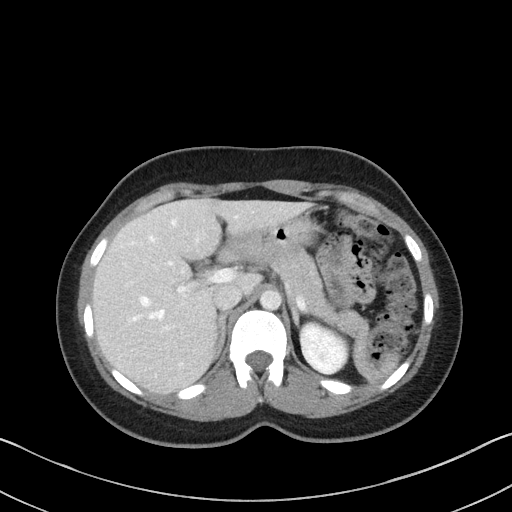
[im 72/92  soft-tissue]
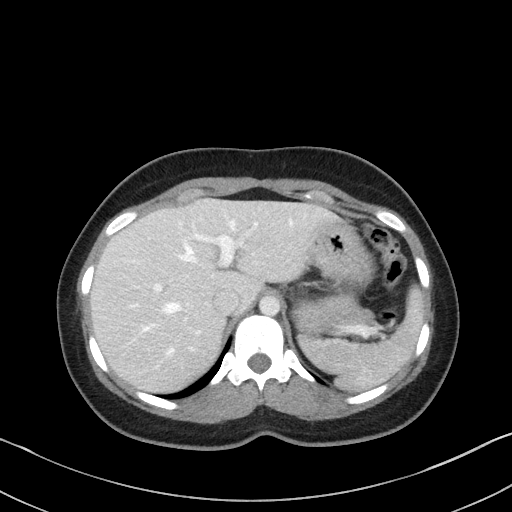
[im 82/92  soft-tissue]
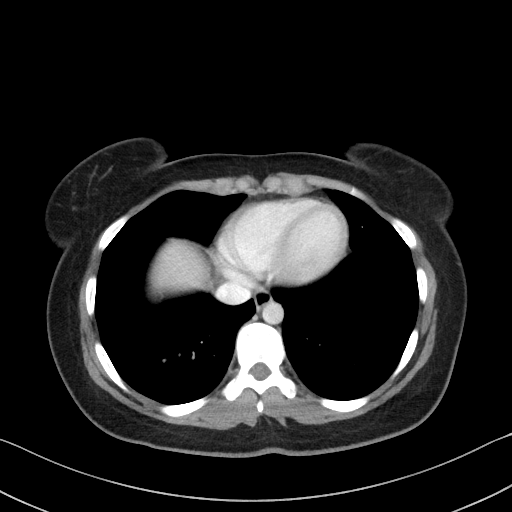
[im 87/92  soft-tissue]
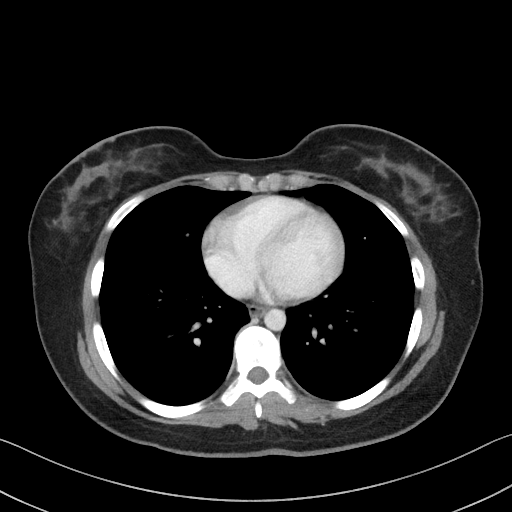

[Series 5: a/p w/ cor · coronal · 0.75mm/px · 3 of 101 slices shown]
[im 34/101  soft-tissue]
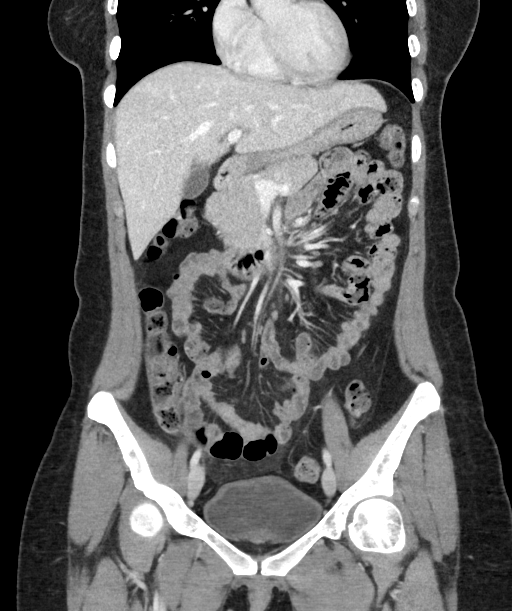
[im 45/101  soft-tissue]
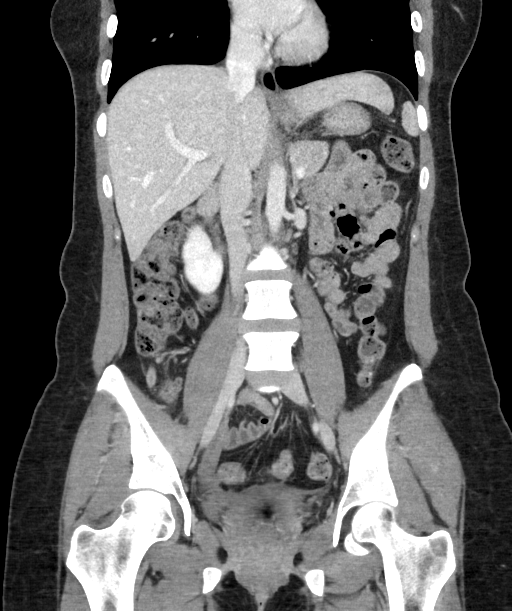
[im 56/101  soft-tissue]
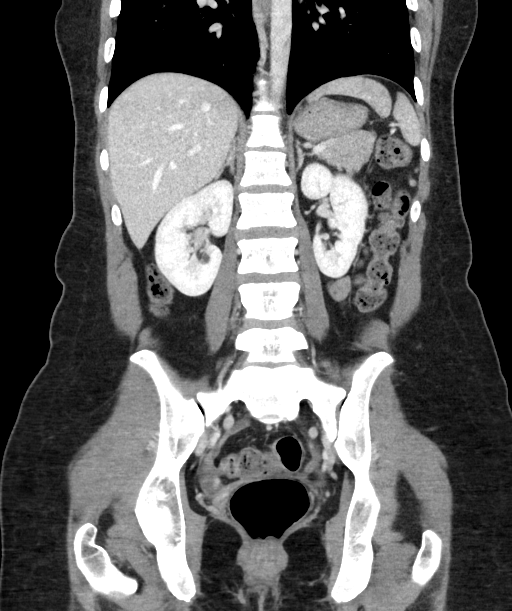

[17 of 46 positions shown; findings below may reference images not displayed]

FINDINGS: Lower chest: Lung bases are clear. No effusions. Heart is normal
size.

Hepatobiliary: No focal hepatic abnormality. Gallbladder
unremarkable.

Pancreas: No focal abnormality or ductal dilatation.

Spleen: No focal abnormality.  Normal size.

Adrenals/Urinary Tract: No adrenal abnormality. No focal renal
abnormality. No stones or hydronephrosis. Urinary bladder is
unremarkable.

Stomach/Bowel: Normal retrocecal appendix. Stomach, large and small
bowel grossly unremarkable.

Vascular/Lymphatic: No evidence of aneurysm or adenopathy.

Reproductive: Prior hysterectomy. No adnexal masses. Small
collapsing right ovarian cyst or follicle.

Other: Small amount of free fluid in the pelvis.  No free air.

Musculoskeletal: No acute bony abnormality or focal bone lesion.
IMPRESSION: Normal retrocecal appendix.

Small collapsing right ovarian cyst or follicle. Small amount of
free fluid in the pelvis.

## 2016-02-18 MED ORDER — OXYCODONE-ACETAMINOPHEN 5-325 MG PO TABS: 1.0000 | ORAL_TABLET | ORAL | Status: DC | PRN

## 2016-02-18 MED ORDER — IOPAMIDOL (ISOVUE-300) INJECTION 61%
INTRAVENOUS | Status: AC
  Administered 2016-02-18: 100 mL
  Filled 2016-02-18: qty 100

## 2016-02-18 MED ORDER — KETOROLAC TROMETHAMINE 30 MG/ML IJ SOLN
30.0000 mg | Freq: Once | INTRAMUSCULAR | Status: AC
  Administered 2016-02-18: 30 mg via INTRAVENOUS
  Filled 2016-02-18: qty 1

## 2016-02-18 MED ORDER — ONDANSETRON HCL 4 MG/2ML IJ SOLN
4.0000 mg | Freq: Once | INTRAMUSCULAR | Status: AC
  Administered 2016-02-18: 4 mg via INTRAVENOUS
  Filled 2016-02-18: qty 2

## 2016-02-18 MED ORDER — ONDANSETRON 8 MG PO TBDP: 8.0000 mg | ORAL_TABLET | Freq: Three times a day (TID) | ORAL | Status: DC | PRN

## 2016-02-18 NOTE — ED Notes (Signed)
Patient transported to CT 

## 2016-02-18 NOTE — Discharge Instructions (Signed)
Take ibuprofen for pain. Percocet for severe pain. Zofran for nausea. Follow up with OB/GYN if pain not improving by tomorrow or go [facility] if worsening.   Ovarian Cyst An ovarian cyst is a fluid-filled sac that forms on an ovary. The ovaries are small organs that produce eggs in women. Various types of cysts can form on the ovaries. Most are not cancerous. Many do not cause problems, and they often go away on their own. Some may cause symptoms and require treatment. Common types of ovarian cysts include:  Functional cysts--These cysts may occur every month during the menstrual cycle. This is normal. The cysts usually go away with the next menstrual cycle if the woman does not get pregnant. Usually, there are no symptoms with a functional cyst.  Endometrioma cysts--These cysts form from the tissue that lines the uterus. They are also called "chocolate cysts" because they become filled with blood that turns brown. This type of cyst can cause pain in the lower abdomen during intercourse and with your menstrual period.  Cystadenoma cysts--This type develops from the cells on the outside of the ovary. These cysts can get very big and cause lower abdomen pain and pain with intercourse. This type of cyst can twist on itself, cut off its blood supply, and cause severe pain. It can also easily rupture and cause a lot of pain.  Dermoid cysts--This type of cyst is sometimes found in both ovaries. These cysts may contain different kinds of body tissue, such as skin, teeth, hair, or cartilage. They usually do not cause symptoms unless they get very big.  Theca lutein cysts--These cysts occur when too much of a certain hormone (human chorionic gonadotropin) is produced and overstimulates the ovaries to produce an egg. This is most common after procedures used to assist with the conception of a baby (in vitro fertilization). CAUSES   Fertility drugs can cause a condition in which multiple large cysts  are formed on the ovaries. This is called ovarian hyperstimulation syndrome.  A condition called polycystic ovary syndrome can cause hormonal imbalances that can lead to nonfunctional ovarian cysts. SIGNS AND SYMPTOMS  Many ovarian cysts do not cause symptoms. If symptoms are present, they may include:  Pelvic pain or pressure.  Pain in the lower abdomen.  Pain during sexual intercourse.  Increasing girth (swelling) of the abdomen.  Abnormal menstrual periods.  Increasing pain with menstrual periods.  Stopping having menstrual periods without being pregnant. DIAGNOSIS  These cysts are commonly found during a routine or annual pelvic exam. Tests may be ordered to find out more about the cyst. These tests may include:  Ultrasound.  X-ray of the pelvis.  CT scan.  MRI.  Blood tests. TREATMENT  Many ovarian cysts go away on their own without treatment. Your health care provider may want to check your cyst regularly for 2-3 months to see if it changes. For women in menopause, it is particularly important to monitor a cyst closely because of the higher rate of ovarian cancer in menopausal women. When treatment is needed, it may include any of the following:  A procedure to drain the cyst (aspiration). This may be done using a long needle and ultrasound. It can also be done through a laparoscopic procedure. This involves using a thin, lighted tube with a tiny camera on the end (laparoscope) inserted through a small incision.  Surgery to remove the whole cyst. This may be done using laparoscopic surgery or an open surgery involving a larger  incision in the lower abdomen.  Hormone treatment or birth control pills. These methods are sometimes used to help dissolve a cyst. HOME CARE INSTRUCTIONS   Only take over-the-counter or prescription medicines as directed by your health care provider.  Follow up with your health care provider as directed.  Get regular pelvic exams and Pap  tests. SEEK MEDICAL CARE IF:   Your periods are late, irregular, or painful, or they stop.  Your pelvic pain or abdominal pain does not go away.  Your abdomen becomes larger or swollen.  You have pressure on your bladder or trouble emptying your bladder completely.  You have pain during sexual intercourse.  You have feelings of fullness, pressure, or discomfort in your stomach.  You lose weight for no apparent reason.  You feel generally ill.  You become constipated.  You lose your appetite.  You develop acne.  You have an increase in body and facial hair.  You are gaining weight, without changing your exercise and eating habits.  You think you are pregnant. SEEK IMMEDIATE MEDICAL CARE IF:   You have increasing abdominal pain.  You feel sick to your stomach (nauseous), and you throw up (vomit).  You develop a fever that comes on suddenly.  You have abdominal pain during a bowel movement.  Your menstrual periods become heavier than usual. MAKE SURE YOU:  Understand these instructions.  Will watch your condition.  Will get help right away if you are not doing well or get worse.   This information is not intended to replace advice given to you by your health care provider. Make sure you discuss any questions you have with your health care provider.   Document Released: 08/03/2005 Document Revised: 08/08/2013 Document Reviewed: 04/10/2013 Elsevier Interactive Patient Education [employer]2016 [employer] Inc.

## 2016-02-18 NOTE — ED Provider Notes (Signed)
CSN: [id number]     Arrival date & time 02/18/16  0350 History   First MD Initiated Contact with Patient 02/18/16 0602     Chief Complaint  Patient presents with  . Abdominal Pain     (Consider location/radiation/quality/duration/timing/severity/associated sxs/prior Treatment) HPI Annette Ellison is a 30 y.o. female with hx of chronic pelvic pain, hx of hysterectomy, still has right ovary, chronic headaches, chronic urinary symptoms followed by urology, presents to ED with complaint of abdominal pain. Pain started yesterday. Pain is sharp, in the right lower quadrant, radiates all over. States different from her chronic pelvic pain which is normally in the left side. Reports nausea, and 1 episode of emesis yesterday after eating. Reports chronic urinary symptoms. States last bowel movement 2 days ago, states may be constipated. Denies fever, chills, malaise. Did not take any medications. Nothing makes her symptoms better or worse.   Past Medical History  Diagnosis Date  . HA (headache)   . Pelvic pain in female   . Urgency of urination   . Frequency of urination   . Wears contact lenses   . GERD (gastroesophageal reflux disease)   . Elevated cholesterol   . High blood pressure   . Allergic rhinitis   . Osteoarthritis   . Depression    Past Surgical History  Procedure Laterality Date  . Cesarean section  08-12-2010  . Laparoscopy w/ ovarian cystectomy  2010  . Abdominal hysterectomy  2012    W/  LEFT SALPINGOOPHORECTOMY  . Cysto with hydrodistension N/A 03/15/2014    Procedure: CYSTO/HYDRODISTENSION OF BLADDER/INSTALLATION of marcaine and pyridium;  Surgeon: Martina SinnerScott A MacDiarmid, MD;  [facility];  Service: Urology;  Laterality: N/A;   Family History  Problem Relation Age of Onset  . Migraines Mother   . High Cholesterol Mother   . High blood pressure Mother   . Mental illness    . Stroke    . Diabetes     Social History  Substance Use Topics  . Smoking  status: Never Smoker   . Smokeless tobacco: Never Used  . Alcohol Use: 0.6 oz/week    1 Glasses of wine per week     Comment: OCC   OB History    No data available     Review of Systems  Constitutional: Negative for fever and chills.  Respiratory: Negative for cough, chest tightness and shortness of breath.   Cardiovascular: Negative for chest pain, palpitations and leg swelling.  Gastrointestinal: Positive for nausea, vomiting and abdominal pain. Negative for diarrhea.  Genitourinary: Positive for pelvic pain. Negative for dysuria, flank pain, vaginal bleeding, vaginal discharge and vaginal pain.  Musculoskeletal: Negative for myalgias, arthralgias, neck pain and neck stiffness.  Skin: Negative for rash.  Neurological: Negative for dizziness, weakness and headaches.  All other systems reviewed and are negative.     Allergies  Penicillins; Pineapple; Other; and Vicodin  Home Medications   Prior to Admission medications   Medication Sig Start Date End Date Taking? Authorizing Provider  Cholecalciferol (VITAMIN D3) 1000 UNITS CAPS Take 1 capsule by mouth daily.   Yes Historical Provider, MD  eszopiclone (LUNESTA) 2 MG TABS tablet Take 1 tablet (2 mg total) by mouth at bedtime as needed for sleep. Take immediately before bedtime 11/18/15  Yes Waymon Budgelinton D Young, MD  naratriptan (AMERGE) 2.5 MG tablet Take 2.5 mg by mouth as needed for migraine. Take one (1) tablet at onset of headache; if returns or does not resolve,  may repeat after 4 hours; do not exceed five (5) mg in 24 hours.   Yes Historical Provider, MD  tiZANidine (ZANAFLEX) 4 MG capsule Take 4 mg by mouth 2 (two) times daily as needed for muscle spasms.   Yes Historical Provider, MD  topiramate (TOPAMAX) 100 MG tablet Take 200 mg by mouth every evening.    Yes Historical Provider, MD   BP 99/74 mmHg  Pulse 79  Temp(Src) 98.1 F (36.7 C) (Oral)  Resp 14  Ht 5\' 2"  (1.575 m)  Wt 61.236 kg  BMI 24.69 kg/m2  SpO2  99% Physical Exam  Constitutional: She is oriented to person, place, and time. She appears well-developed and well-nourished. No distress.  HENT:  Head: Normocephalic.  Eyes: Conjunctivae are normal.  Neck: Neck supple.  Cardiovascular: Normal rate, regular rhythm and normal heart sounds.   Pulmonary/Chest: Effort normal and breath sounds normal. No respiratory distress. She has no wheezes. She has no rales.  Abdominal: Soft. Bowel sounds are normal. She exhibits no distension. There is tenderness. There is no rebound and no guarding.  RLQ tenderness.  Musculoskeletal: She exhibits no edema.  Neurological: She is alert and oriented to person, place, and time.  Skin: Skin is warm and dry.  Psychiatric: She has a normal mood and affect. Her behavior is normal.  Nursing note and vitals reviewed.   ED Course  Procedures (including critical care time) Labs Review Labs Reviewed  COMPREHENSIVE METABOLIC PANEL - Abnormal; Notable for the following:    Potassium 3.4 (*)    Chloride 113 (*)    CO2 18 (*)    AST 11 (*)    ALT 12 (*)    All other components within normal limits  URINALYSIS, ROUTINE W REFLEX MICROSCOPIC (NOT AT [facility]) - Abnormal; Notable for the following:    APPearance CLOUDY (*)    All other components within normal limits  LIPASE, BLOOD  CBC    Imaging Review [place] Transvaginal Non-ob  02/18/2016  CLINICAL DATA:  Pelvic pain. Symptoms began yesterday. History of a hysterectomy and left oophorectomy. History of polycystic ovarian syndrome. EXAM: TRANSABDOMINAL AND TRANSVAGINAL ULTRASOUND OF PELVIS DOPPLER ULTRASOUND OF OVARIES TECHNIQUE: Both transabdominal and transvaginal ultrasound examinations of the pelvis were performed. Transabdominal technique was performed for global imaging of the pelvis including uterus, ovaries, adnexal regions, and pelvic cul-de-sac. It was necessary to proceed with endovaginal exam following the transabdominal exam to visualize the right ovary to  better advantage. Color and duplex Doppler ultrasound was utilized to evaluate blood flow to the ovaries. COMPARISON:  CT, 02/18/2016.  The FINDINGS: Uterus Surgically absent Right ovary Measurements: 3.8 x 1.6 x 2.7 cm. Complex ovoid masslike area measuring 2.1 x 1.3 x 2.2 cm, consistent with an involuting or hemorrhagic cyst, consistent with the findings on the current CT. Normal right ovarian blood flow. No right adnexal masses. Left ovary Surgically absent.  No left adnexal masses. Pulsed Doppler evaluation of the right ovary demonstrates normal low-resistance arterial and venous waveforms. Other findings Small amount pelvic free fluid. IMPRESSION: 1. Status post hysterectomy and left oophorectomy. 2. No pelvic masses. 3. 2.2 cm complex right ovarian cyst likely an involuting follicular cyst or hemorrhagic cyst. Small amount pelvic free fluid. Findings support partial rupture of an ovarian cyst as cause of the patient's pain. 4. No evidence of ovarian torsion. Electronically Signed   By: Amie Portlandavid  Ormond M.D.   On: 02/18/2016 08:30   [place] Pelvis Complete  02/18/2016  CLINICAL DATA:  Pelvic  pain. Symptoms began yesterday. History of a hysterectomy and left oophorectomy. History of polycystic ovarian syndrome. EXAM: TRANSABDOMINAL AND TRANSVAGINAL ULTRASOUND OF PELVIS DOPPLER ULTRASOUND OF OVARIES TECHNIQUE: Both transabdominal and transvaginal ultrasound examinations of the pelvis were performed. Transabdominal technique was performed for global imaging of the pelvis including uterus, ovaries, adnexal regions, and pelvic cul-de-sac. It was necessary to proceed with endovaginal exam following the transabdominal exam to visualize the right ovary to better advantage. Color and duplex Doppler ultrasound was utilized to evaluate blood flow to the ovaries. COMPARISON:  CT, 02/18/2016.  The FINDINGS: Uterus Surgically absent Right ovary Measurements: 3.8 x 1.6 x 2.7 cm. Complex ovoid masslike area measuring 2.1 x 1.3 x 2.2  cm, consistent with an involuting or hemorrhagic cyst, consistent with the findings on the current CT. Normal right ovarian blood flow. No right adnexal masses. Left ovary Surgically absent.  No left adnexal masses. Pulsed Doppler evaluation of the right ovary demonstrates normal low-resistance arterial and venous waveforms. Other findings Small amount pelvic free fluid. IMPRESSION: 1. Status post hysterectomy and left oophorectomy. 2. No pelvic masses. 3. 2.2 cm complex right ovarian cyst likely an involuting follicular cyst or hemorrhagic cyst. Small amount pelvic free fluid. Findings support partial rupture of an ovarian cyst as cause of the patient's pain. 4. No evidence of ovarian torsion. Electronically Signed   By: Amie Portland M.D.   On: 02/18/2016 08:30   Ct Abdomen Pelvis W Contrast  02/18/2016  CLINICAL DATA:  Right lower quadrant pain since yesterday. EXAM: CT ABDOMEN AND PELVIS WITH CONTRAST TECHNIQUE: Multidetector CT imaging of the abdomen and pelvis was performed using the standard protocol following bolus administration of intravenous contrast. CONTRAST:  ISOVUE-300 IOPAMIDOL (ISOVUE-300) INJECTION 61% COMPARISON:  None. FINDINGS: Lower chest: Lung bases are clear. No effusions. Heart is normal size. Hepatobiliary: No focal hepatic abnormality. Gallbladder unremarkable. Pancreas: No focal abnormality or ductal dilatation. Spleen: No focal abnormality.  Normal size. Adrenals/Urinary Tract: No adrenal abnormality. No focal renal abnormality. No stones or hydronephrosis. Urinary bladder is unremarkable. Stomach/Bowel: Normal retrocecal appendix. Stomach, large and small bowel grossly unremarkable. Vascular/Lymphatic: No evidence of aneurysm or adenopathy. Reproductive: Prior hysterectomy. No adnexal masses. Small collapsing right ovarian cyst or follicle. Other: Small amount of free fluid in the pelvis.  No free air. Musculoskeletal: No acute bony abnormality or focal bone lesion. IMPRESSION:  Normal retrocecal appendix. Small collapsing right ovarian cyst or follicle. Small amount of free fluid in the pelvis. Electronically Signed   By: Charlett Nose M.D.   On: 02/18/2016 07:10   [place] Art/ven Flow Abd Pelv Doppler  02/18/2016  CLINICAL DATA:  Pelvic pain. Symptoms began yesterday. History of a hysterectomy and left oophorectomy. History of polycystic ovarian syndrome. EXAM: TRANSABDOMINAL AND TRANSVAGINAL ULTRASOUND OF PELVIS DOPPLER ULTRASOUND OF OVARIES TECHNIQUE: Both transabdominal and transvaginal ultrasound examinations of the pelvis were performed. Transabdominal technique was performed for global imaging of the pelvis including uterus, ovaries, adnexal regions, and pelvic cul-de-sac. It was necessary to proceed with endovaginal exam following the transabdominal exam to visualize the right ovary to better advantage. Color and duplex Doppler ultrasound was utilized to evaluate blood flow to the ovaries. COMPARISON:  CT, 02/18/2016.  The FINDINGS: Uterus Surgically absent Right ovary Measurements: 3.8 x 1.6 x 2.7 cm. Complex ovoid masslike area measuring 2.1 x 1.3 x 2.2 cm, consistent with an involuting or hemorrhagic cyst, consistent with the findings on the current CT. Normal right ovarian blood flow. No right adnexal masses. Left ovary  Surgically absent.  No left adnexal masses. Pulsed Doppler evaluation of the right ovary demonstrates normal low-resistance arterial and venous waveforms. Other findings Small amount pelvic free fluid. IMPRESSION: 1. Status post hysterectomy and left oophorectomy. 2. No pelvic masses. 3. 2.2 cm complex right ovarian cyst likely an involuting follicular cyst or hemorrhagic cyst. Small amount pelvic free fluid. Findings support partial rupture of an ovarian cyst as cause of the patient's pain. 4. No evidence of ovarian torsion. Electronically Signed   By: Amie Portland M.D.   On: 02/18/2016 08:30   I have personally reviewed and evaluated these images and lab  results as part of my medical decision-making.   EKG Interpretation None      MDM   Final diagnoses:  Adnexal pain  Cyst of right ovary    Pt with chronic pelvic pain, presents to ED with new pain to RLQ in her abdomen. Will get CT abd/pelvis to ro appendicitis.     CT scan is negative for appendicitis, it does show small collapsing right ovarian cyst. I will get ultrasound for further evaluation of the cyst and to rule out torsion at this time. Patient also started to have headache. Will order Toradol.   Ultrasound shows 2.2 cm complex right ovarian cyst with small amount of pelvic fluid. Partial rupture of ovarian cyst is high differential. Will discharge home with 10 tablets of Percocet, patient is allergic to Vicodin. Ibuprofen for pain. Zofran for nausea. Will have her follow-up with her OB/GYN. Patient was given Toradol 30 mg IV for headache and ovarian cyst in emergency department.  Filed Vitals:   02/18/16 0545 02/18/16 0600 02/18/16 0715 02/18/16 0833  BP: 108/75 99/74 105/56 112/70  Pulse: 81 79 78 81  Temp:      TempSrc:      Resp:    14  Height:      Weight:      SpO2: 100% 99% 100% 100%     Jaynie Crumble, PA-C 02/18/16 1610  Tomasita Crumble, MD 02/18/16 1227

## 2016-02-18 NOTE — ED Notes (Signed)
Patient presents with c/o pain to the right lower quad that travels to the umbilicus.  Has noticed some painful urination, denies vaginal discharge

## 2016-03-25 ENCOUNTER — Ambulatory Visit: Payer: PRIVATE HEALTH INSURANCE | Admitting: Internal Medicine

## 2017-12-22 ENCOUNTER — Ambulatory Visit (HOSPITAL_COMMUNITY)
Admission: EM | Admit: 2017-12-22 | Discharge: 2017-12-22 | Disposition: A | Discharge status: Home / Self Care | Payer: PRIVATE HEALTH INSURANCE | Attending: Family Medicine | Admitting: Family Medicine

## 2017-12-22 ENCOUNTER — Encounter (HOSPITAL_COMMUNITY): Payer: Self-pay | Admitting: Family Medicine

## 2017-12-22 DIAGNOSIS — R0981 Nasal congestion: Secondary | ICD-10-CM

## 2017-12-22 DIAGNOSIS — H9203 Otalgia, bilateral: Secondary | ICD-10-CM

## 2017-12-22 MED ORDER — MONTELUKAST SODIUM 10 MG PO TABS: 10.0000 mg | ORAL_TABLET | Freq: Every day | ORAL | 0 refills | Status: DC

## 2017-12-22 MED ORDER — IPRATROPIUM BROMIDE 0.03 % NA SOLN: 2.0000 | Freq: Two times a day (BID) | NASAL | 0 refills | Status: DC

## 2017-12-22 NOTE — Discharge Instructions (Addendum)
May try NasaCort  Begin singulair daily at bedtime  Try afrin daily for 3-4 days- do not use longer  Continue other decongestants  Tylenol and ibuprofen for pain

## 2017-12-22 NOTE — ED Triage Notes (Addendum)
Pt here for bilateral ear pain, scratchy throat. She is currently taking prednisone and z pac

## 2017-12-23 NOTE — ED Provider Notes (Signed)
[facility]    CSN: [id number] Arrival date & time: 12/22/17  1903     History   Chief Complaint Chief Complaint  Patient presents with  . Otalgia    HPI Annette Ellison is a 32 y.o. female history of allergic rhinitis presenting today for evaluation of bilateral ear pain, throat discomfort as well as right-sided nasal congestion.  Patient states that her symptoms began a couple weeks ago but on Sunday they worsened, approximately 5 to 6 days ago.  Patient has been trying many over-the-counter decongestants and antihistamines including loratadine/phenylephrine/Flonase/Claritin-D without relief.  Patient presented to her PCP earlier in the week and was initiated on prednisone and azithromycin.  Patient has 1 more day of both of these medicines and has not had any relief.  Cough has been minimal.  Denies any nausea or vomiting, abdominal pain, diarrhea.  Denies fevers.  HPI  Past Medical History:  Diagnosis Date  . Allergic rhinitis   . Depression   . Elevated cholesterol   . Frequency of urination   . GERD (gastroesophageal reflux disease)   . HA (headache)   . High blood pressure   . Osteoarthritis   . Pelvic pain in female   . Urgency of urination   . Wears contact lenses     Patient Active Problem List   Diagnosis Date Noted  . Insomnia 06/18/2015  . HA (headache)     Past Surgical History:  Procedure Laterality Date  . ABDOMINAL HYSTERECTOMY  2012   W/  LEFT SALPINGOOPHORECTOMY  . CESAREAN SECTION  08-12-2010  . CYSTO WITH HYDRODISTENSION N/A 03/15/2014   Procedure: CYSTO/HYDRODISTENSION OF BLADDER/INSTALLATION of marcaine and pyridium;  Surgeon: Martina Sinner, MD;  Location: [facility];  Service: Urology;  Laterality: N/A;  . LAPAROSCOPY W/ OVARIAN CYSTECTOMY  2010    OB History   None      Home Medications    Prior to Admission medications   Medication Sig Start Date End Date Taking? Authorizing Provider  Cholecalciferol  (VITAMIN D3) 1000 UNITS CAPS Take 1 capsule by mouth daily.    [provider]  eszopiclone (LUNESTA) 2 MG TABS tablet Take 1 tablet (2 mg total) by mouth at bedtime as needed for sleep. Take immediately before bedtime 11/18/15   Jetty Duhamel D, MD  ipratropium (ATROVENT) 0.03 % nasal spray Place 2 sprays into both nostrils every 12 (twelve) hours. 12/22/17   Wieters, Hallie C, PA-C  montelukast (SINGULAIR) 10 MG tablet Take 1 tablet (10 mg total) by mouth at bedtime. 12/22/17 01/21/18  Wieters, Hallie C, PA-C  naratriptan (AMERGE) 2.5 MG tablet Take 2.5 mg by mouth as needed for migraine. Take one (1) tablet at onset of headache; if returns or does not resolve, may repeat after 4 hours; do not exceed five (5) mg in 24 hours.    [provider]  ondansetron (ZOFRAN ODT) 8 MG disintegrating tablet Take 1 tablet (8 mg total) by mouth every 8 (eight) hours as needed for nausea or vomiting. 02/18/16   Kirichenko, Lemont Fillers, PA-C  oxyCODONE-acetaminophen (PERCOCET) 5-325 MG tablet Take 1 tablet by mouth every 4 (four) hours as needed for severe pain. 02/18/16   Kirichenko, Tatyana, PA-C  tiZANidine (ZANAFLEX) 4 MG capsule Take 4 mg by mouth 2 (two) times daily as needed for muscle spasms.    [provider]  topiramate (TOPAMAX) 100 MG tablet Take 200 mg by mouth every evening.     [provider]  Family History Family History  Problem Relation Age of Onset  . Migraines Mother   . High Cholesterol Mother   . High blood pressure Mother   . Mental illness Unknown   . Stroke Unknown   . Diabetes Unknown     Social History Social History   Tobacco Use  . Smoking status: Never Smoker  . Smokeless tobacco: Never Used  Substance Use Topics  . Alcohol use: Yes    Alcohol/week: 0.6 oz    Types: 1 Glasses of wine per week    Comment: OCC  . Drug use: No     Allergies   Penicillins; Pineapple; Other; and Vicodin [hydrocodone-acetaminophen]   Review of  Systems Review of Systems  Constitutional: Negative for chills, fatigue and fever.  HENT: Positive for congestion, ear pain, rhinorrhea, sinus pressure and sore throat. Negative for trouble swallowing.   Respiratory: Negative for cough, chest tightness and shortness of breath.   Cardiovascular: Negative for chest pain.  Gastrointestinal: Negative for abdominal pain, nausea and vomiting.  Musculoskeletal: Negative for myalgias.  Skin: Negative for rash.  Neurological: Negative for dizziness, light-headedness and headaches.     Physical Exam Triage Vital Signs ED Triage Vitals  Enc Vitals Group     BP 12/22/17 1935 127/77     Pulse Rate 12/22/17 1935 (!) 112     Resp 12/22/17 1935 18     Temp 12/22/17 1935 98.8 F (37.1 C)     Temp src --      SpO2 12/22/17 1935 100 %     Weight --      Height --      Head Circumference --      Peak Flow --      Pain Score 12/22/17 1934 4     Pain Loc --      Pain Edu? --      Excl. in GC? --    No data found.  Updated Vital Signs BP 127/77   Pulse (!) 112   Temp 98.8 F (37.1 C)   Resp 18   SpO2 100%   Visual Acuity Right Eye Distance:   Left Eye Distance:   Bilateral Distance:    Right Eye Near:   Left Eye Near:    Bilateral Near:     Physical Exam  Constitutional: She appears well-developed and well-nourished. No distress.  HENT:  Head: Normocephalic and atraumatic.  Bilateral TMs nonerythematous, nasal mucosa erythematous with out rhinorrhea, posterior oropharynx erythematous, no tonsillar enlargement or exudate.  Eyes: Conjunctivae are normal.  Neck: Neck supple.  Cardiovascular: Normal rate and regular rhythm.  No murmur heard. Pulmonary/Chest: Effort normal and breath sounds normal. No respiratory distress.  Breathing comfortably at rest, CTA BL without adventitious sounds  Abdominal: Soft. There is no tenderness.  Musculoskeletal: She exhibits no edema.  Neurological: She is alert.  Skin: Skin is warm and dry.   Psychiatric: She has a normal mood and affect.  Nursing note and vitals reviewed.    UC Treatments / Results  Labs (all labs ordered are listed, but only abnormal results are displayed) Labs Reviewed - No data to display  EKG None  Radiology No results found.  Procedures Procedures (including critical care time)  Medications Ordered in UC Medications - No data to display  Initial Impression / Assessment and Plan / UC Course  I have reviewed the triage vital signs and the nursing n patient with likely allergic rhinitis otes.  Pertinent labs & imaging results  that were available during my care of the patient were reviewed by me and considered in my medical decision making (see chart for details).    Patient likely with allergic rhinitis, no improvement with antibiotic and prednisone.  Will recommend to continue measures that she feels are helping as well as continuing Claritin.  Will offer Nasacort or atrovent or afrin alternative for her to Flonase. Will initiate singulair despite lack of asthma as a trial.   Final Clinical Impressions(s) / UC Diagnoses   Final diagnoses:  Otalgia of both ears  Nasal congestion     Discharge Instructions     May try NasaCort  Begin singulair daily at bedtime  Try afrin daily for 3-4 days- do not use longer  Continue other decongestants  Tylenol and ibuprofen for pain   ED Prescriptions    Medication Sig Dispense Auth. Provider   ipratropium (ATROVENT) 0.03 % nasal spray Place 2 sprays into both nostrils every 12 (twelve) hours. 30 mL Wieters, Hallie C, PA-C   montelukast (SINGULAIR) 10 MG tablet Take 1 tablet (10 mg total) by mouth at bedtime. 30 tablet Wieters, Matlacha C, PA-C     Controlled Substance Prescriptions [place] Controlled Substance Registry consulted? Not Applicable   Lew Dawes, [place] 12/23/17 [id number]

## 2018-03-28 ENCOUNTER — Ambulatory Visit: Payer: PRIVATE HEALTH INSURANCE | Admitting: Student in an Organized Health Care Education/Training Program

## 2018-05-02 ENCOUNTER — Other Ambulatory Visit: Payer: Self-pay

## 2018-05-02 ENCOUNTER — Ambulatory Visit (INDEPENDENT_AMBULATORY_CARE_PROVIDER_SITE_OTHER): Payer: PRIVATE HEALTH INSURANCE | Admitting: Student in an Organized Health Care Education/Training Program

## 2018-05-02 ENCOUNTER — Encounter: Payer: Self-pay | Admitting: Student in an Organized Health Care Education/Training Program

## 2018-05-02 VITALS — BP 100/62 | HR 78 | Temp 98.6°F | Ht 63.0 in | Wt 169.0 lb

## 2018-05-02 DIAGNOSIS — G43809 Other migraine, not intractable, without status migrainosus: Secondary | ICD-10-CM | POA: Diagnosis not present

## 2018-05-02 DIAGNOSIS — Z23 Encounter for immunization: Secondary | ICD-10-CM | POA: Diagnosis not present

## 2018-05-02 DIAGNOSIS — Z0000 Encounter for general adult medical examination without abnormal findings: Secondary | ICD-10-CM

## 2018-05-02 MED ORDER — TOPIRAMATE 100 MG PO TABS: 200.0000 mg | ORAL_TABLET | Freq: Every evening | ORAL | 0 refills | Status: DC

## 2018-05-02 NOTE — Progress Notes (Signed)
   CC: Establish care  HPI: Annette Ellison is a 32 y.o. female with PMH significant for GERD, HLD, Migraines who [facility] to establish care as a new patient.  PMH: 1. GERD 2. HLD (2005) - never on medication. 3. Migraines (2006) - was previously taking topomax and flexeril prescribed by migraine clinic  PSH: 1. C-section 2006, 2011 2. Hysterectomy 2012  Family Hx: 1. Asthma in Mother, Brother, Sister 2. Clotting disorders in grandmother, Aunts/Uncles 3. Ovarian Cancer in Aunt 4. Mood disorder in Mother 5. Diabetes in Father, Grandparents 6. HTN and HLD and Osteoporosis in Grandparents  Social Hx: - Lives at home with 32 year old son, 32 year old daughter, husband and two dogs - Designates Micael HampshireLuis A Torres to make medical decisions on her behalf if she were unable to do so - Exercises regularly - walks on treadmill - Drinks only on special occasions, Wine. Denies drug use.  - States she feels safe in her relationships  Allergies: 1. PCN - airway closes 2. Vicoden - rash  Meds: 1. Topomax 2. Tizanidine as needed when you get migraines  Overdue health maintenance  1. Colonoscopy 2013 was normal  2. Pap in 2018 was normal  Review of Symptoms:  See HPI for ROS.   CC, SH/smoking status, and VS noted.  Objective: BP 100/62   Pulse 78   Temp 98.6 F (37 C) (Oral)   Ht 5\' 3"  (1.6 m)   Wt 169 lb (76.7 kg)   SpO2 98%   BMI 29.94 kg/m  GEN: NAD, alert, cooperative, and pleasant. EYE: no conjunctival injection, pupils equally round and reactive to light ENMT: no nasal polyps,no rhinorrhea, no pharyngeal erythema or exudates NECK: full ROM, no thyromegaly RESPIRATORY: clear to auscultation bilaterally with no wheezes, rhonchi or rales, good effort CV: RRR, no m/r/g, no peripheral edema GI: soft, non-tender, non-distended, no hepatosplenomegaly SKIN: warm and dry, no rashes or lesions NEURO: II-XII grossly intact, normal gait, peripheral sensation  intact PSYCH: AAOx3, appropriate affect  Assessment and plan:  History of migraines - topomax refilled today. Discussed holding off refill for tizanidine. We can use this if patient has an acute flare in the future but it is not a chronic med.  Encounter to establish care - Reviewed PMH, PSH, surgical, social hx as noted above.  Howard PouchLauren Andrian Sabala, MD,MS,  PGY2 05/02/2018 8:55 AM

## 2018-05-02 NOTE — Patient Instructions (Signed)
It was a pleasure seeing you today in our clinic.   Our clinic's number is [phone]. Please call with questions or concerns about what we discussed today.  Be well, Dr. Lynne Takemoto   

## 2018-05-03 ENCOUNTER — Telehealth: Payer: Self-pay | Admitting: *Deleted

## 2018-05-03 NOTE — Telephone Encounter (Signed)
Called and spoke with the patient. She reports that she had previously tolerated the 300 mg dose of topomax for migraine prevention, however she reports she has been off of this medication for a year. She had symptoms of confusion and panic yesterday after taking the medication.  Advised patient to discontinue this medicine. I will not order it at a lower dose at this time because she is not tolerating the medicine. She already has an appointment with me scheduled. Asked her to keep a migraine diary until her next appointment and we will discuss other options for migraine ppx at that time.

## 2018-05-03 NOTE — Telephone Encounter (Signed)
Pt states that she tried the Topamax yesterday and it made her very loopy and unable to function.   It also gave her a terrible headache today.   She recalls starting Topamax years ago at a much lower dose, she thinks it was 30mg s and working her way up.  She would like Dr. Mosetta PuttFeng to call her ti discuss the possibility of changing the dosage. Kyrin Garn, Maryjo RochesterJessica Dawn, CMA

## 2018-05-25 ENCOUNTER — Other Ambulatory Visit: Payer: Self-pay

## 2018-05-25 ENCOUNTER — Ambulatory Visit: Payer: PRIVATE HEALTH INSURANCE | Admitting: Student in an Organized Health Care Education/Training Program

## 2018-05-25 ENCOUNTER — Encounter: Payer: Self-pay | Admitting: Student in an Organized Health Care Education/Training Program

## 2018-05-25 VITALS — BP 104/68 | HR 87 | Temp 98.1°F | Ht 63.0 in | Wt 171.8 lb

## 2018-05-25 DIAGNOSIS — M25519 Pain in unspecified shoulder: Secondary | ICD-10-CM | POA: Diagnosis not present

## 2018-05-25 MED ORDER — METHYLPREDNISOLONE ACETATE 40 MG/ML IJ SUSP
40.0000 mg | Freq: Once | INTRAMUSCULAR | Status: AC
  Administered 2018-05-25: 40 mg via INTRAMUSCULAR

## 2018-05-25 NOTE — Patient Instructions (Addendum)
It was a pleasure seeing you today in our clinic. Today we discussed your shoulder pain and did a steroid injection. Here is the treatment plan we have discussed and agreed upon together:  - You may be sore over the next day, however please continue to move around so the joint does not get stiff. - Use ibuprofen as needed for pain at home - If you notice an area of redness with warmth, swelling, pain, or you develop fevers, these would be reasons to call our office back or come in to be seen.  Our clinic's number is [phone]. Please call with questions or concerns about what we discussed today.  Be well, Dr. Mosetta Putt

## 2018-05-25 NOTE — Progress Notes (Signed)
   CC: Right shoulder pain  HPI: Annette Ellison is a 32 y.o. female   Patient reports right shoulder pain that has been persistent over the last 2 to 3 years.  She previously did PT and OT in [place], however continued to have pain.  She has pain when she holds the arm in any position.  She has pain with sleeping on that side.  No stiffness.  No trauma or inciting injury.  No history of repetitive motions of the arms such as sports or work-related activities.  There is no particular movement of the arm that bothers her most.  She also noticed some shoulder blade pain on the left side which is newer.   Review of Symptoms:  See HPI for ROS.   CC, SH/smoking status, and VS noted.  Objective: BP 104/68   Pulse 87   Temp 98.1 F (36.7 C) (Oral)   Ht 5\' 3"  (1.6 m)   Wt 171 lb 12.8 oz (77.9 kg)   SpO2 98%   BMI 30.43 kg/m  GEN: NAD, alert, cooperative, and pleasant. Shoulder, right: Redness to palpation over the lateral right deltoid.  Point tenderness.  No evidence of bony deformity, asymmetry, or muscle atrophy.  Does have some tenderness over the long head of the biceps in the bicipital groove.  No tenderness to palpation at the [facility] joint.  Full active and passive range of motion.  Strength is 5 out of 5 throughout.  Peripheral pulses are intact.  Crossarm test is negative.  Empty can test is negative.  Hawkins test is negative.  INJECTION: Patient was given informed consent, signed copy in the chart. Appropriate time out was taken. Area prepped and draped in usual sterile fashion. 40 cc of methylprednisolone 40 mg/ml plus  4 cc of 1% lidocaine with epinephrine was injected into the right subdeltoid bursa at the point of highest tenderness.The patient tolerated the procedure well. There were no complications. Post procedure instructions were given.  Assessment and plan:  1. Right Shoulder Pain -seems most consistent with bursitis given the point tenderness over the deltoid.  May be a  subdeltoid bursa bursitis.  Patient is agreeable to a therapeutic and diagnostic steroid injection at today's visit. - 40 mg solumedrol injection today in the office - continue ibupron PRN pain - encourage continued movement at the shoulder joint - return precautions provided - follow up as needed - Precepted with Dr. Satira Anis, MD,MS,  PGY3 05/25/2018 4:52 PM

## 2018-06-30 ENCOUNTER — Other Ambulatory Visit: Payer: Self-pay | Admitting: Gastroenterology

## 2018-06-30 DIAGNOSIS — R194 Change in bowel habit: Secondary | ICD-10-CM

## 2018-06-30 DIAGNOSIS — R1032 Left lower quadrant pain: Secondary | ICD-10-CM

## 2018-07-11 ENCOUNTER — Ambulatory Visit
Admission: RE | Admit: 2018-07-11 | Discharge: 2018-07-11 | Disposition: A | Discharge status: Home / Self Care | Payer: PRIVATE HEALTH INSURANCE | Source: Ambulatory Visit | Attending: Gastroenterology | Admitting: Gastroenterology

## 2018-07-11 DIAGNOSIS — R194 Change in bowel habit: Secondary | ICD-10-CM

## 2018-07-11 DIAGNOSIS — R1032 Left lower quadrant pain: Secondary | ICD-10-CM | POA: Diagnosis not present

## 2018-07-11 IMAGING — CT CT ABD-PELV W/ CM
2 of 4 series · 16 of 46 positions shown, 18 images · IV contrast (iopamidol)
Comparison: [DATE]

CLINICAL DATA: Chronic left lower quadrant pain and nausea and
vomiting. Change in bowel habits.

EXAM:
CT ABDOMEN AND PELVIS WITH CONTRAST
TECHNIQUE: Multidetector CT imaging of the abdomen and pelvis was performed
using the standard protocol following bolus administration of
intravenous contrast.
CONTRAST:  100mL [8U] IOPAMIDOL ([8U]) INJECTION 61%

[Series 2: abd pelvis · axial · 0.62mm/px · z∈[-1561,-1126]mm · 13 of 97 slices shown, 15 images (1 of 2)]
[im 5/97  soft-tissue]
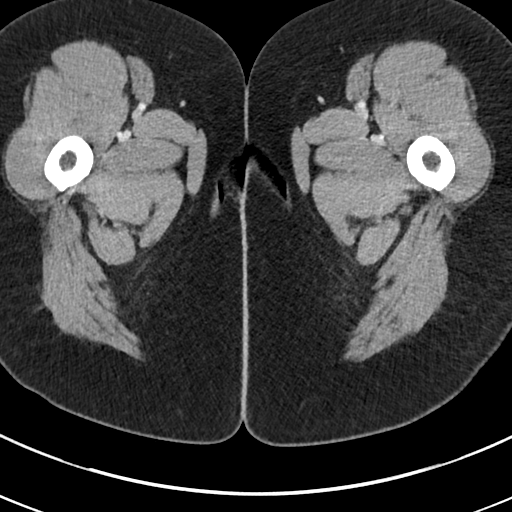
[im 5/97  bone]
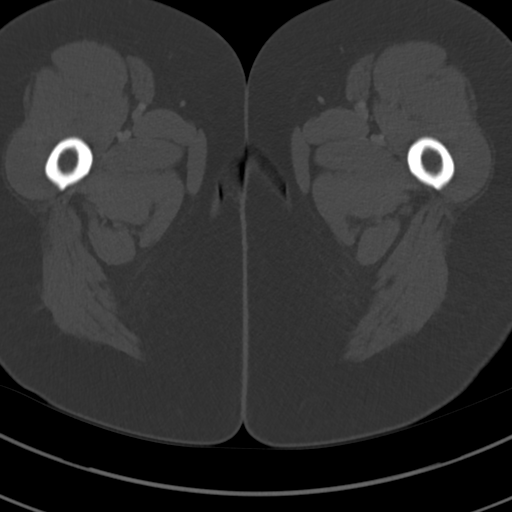
[im 13/97  soft-tissue]
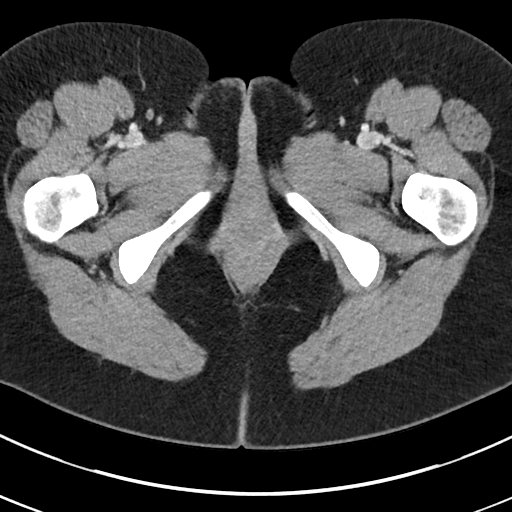
[im 21/97  soft-tissue]
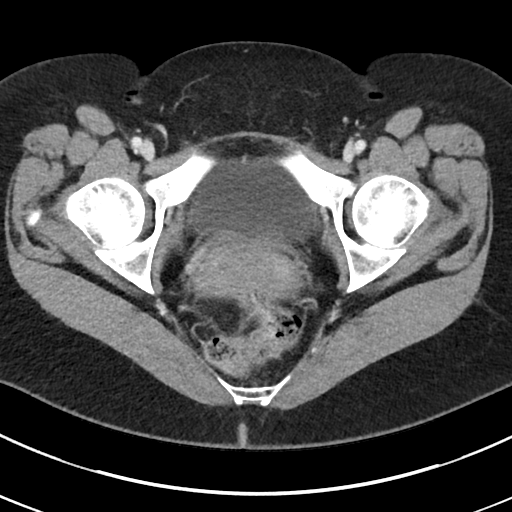
[im 26/97  soft-tissue]
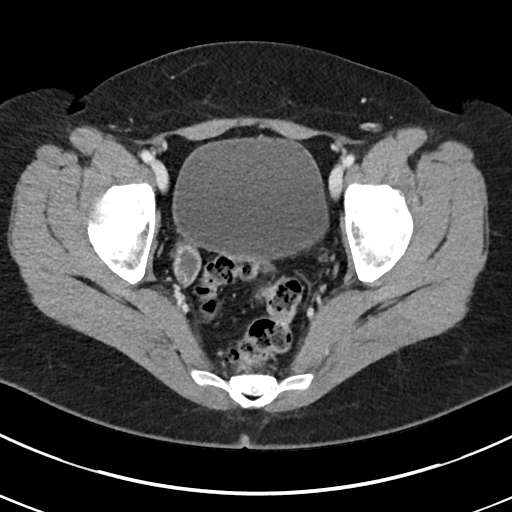
[im 34/97  soft-tissue]
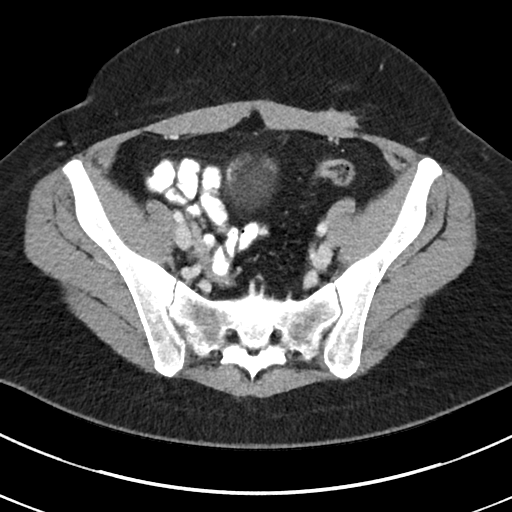
[im 42/97  soft-tissue]
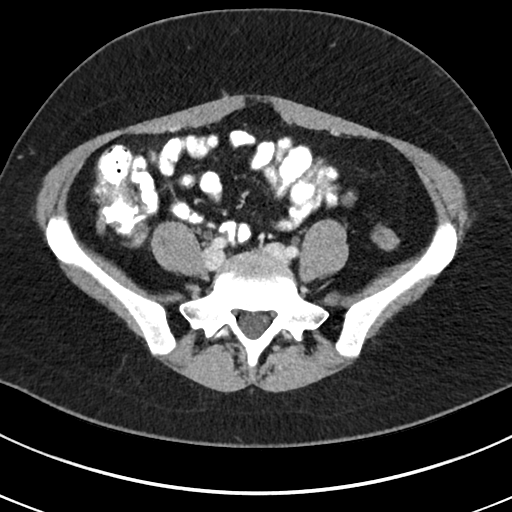
[im 51/97  soft-tissue]
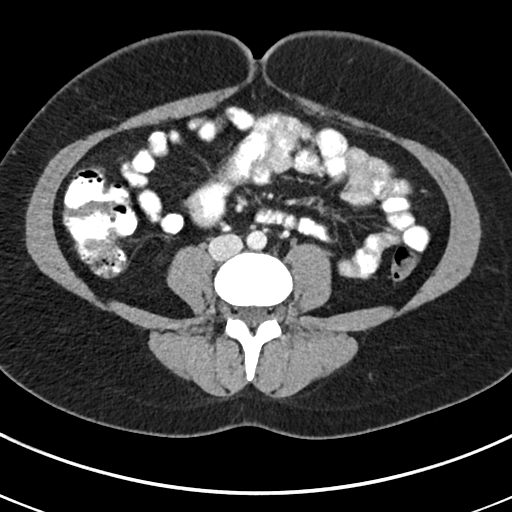
[im 55/97  soft-tissue]
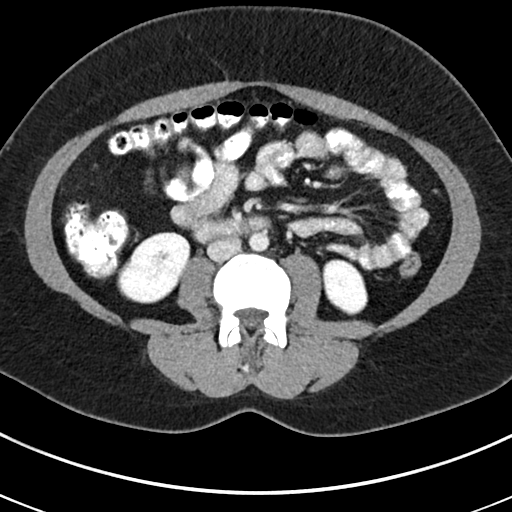
[im 63/97  soft-tissue]
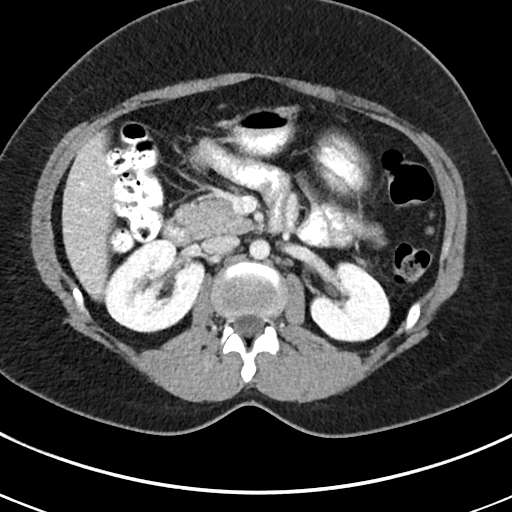
[im 63/97  bone]
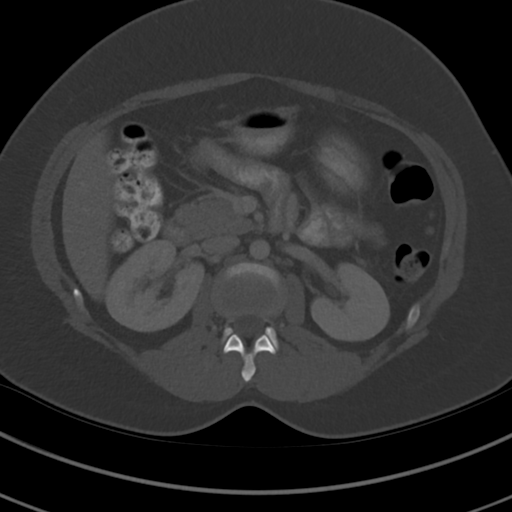
[im 71/97  soft-tissue]
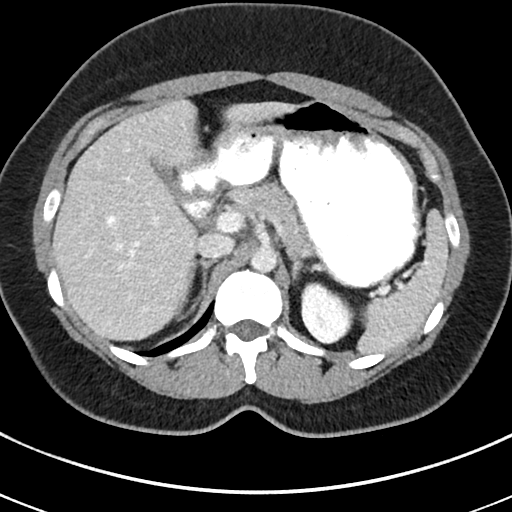
[im 76/97  soft-tissue]
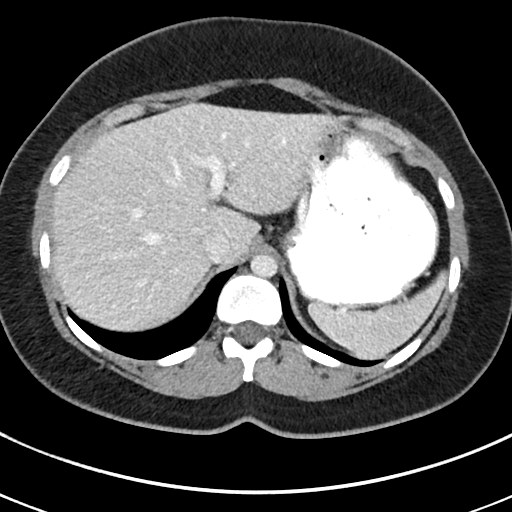
[im 84/97  soft-tissue]
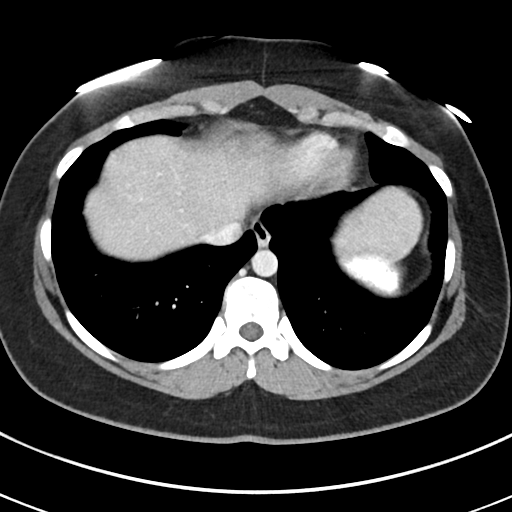
[im 92/97  soft-tissue]
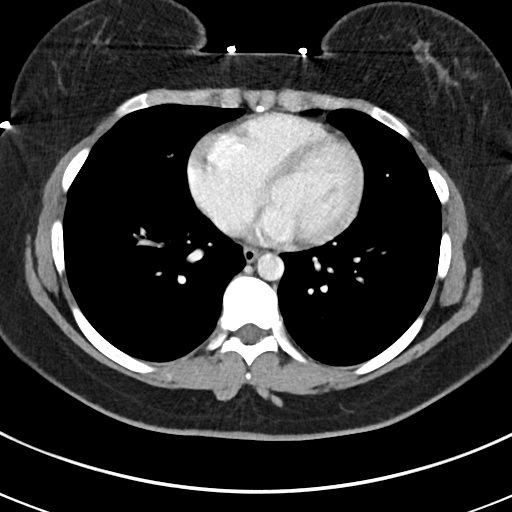

[Series 4: abd pelvis · coronal · 0.62mm/px · 3 of 130 slices shown (2 of 2)]
[im 44/130  soft-tissue]
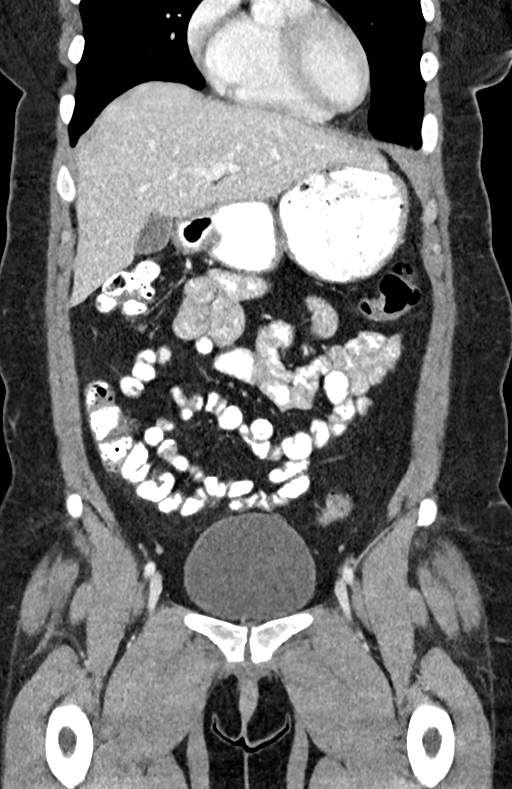
[im 58/130  soft-tissue]
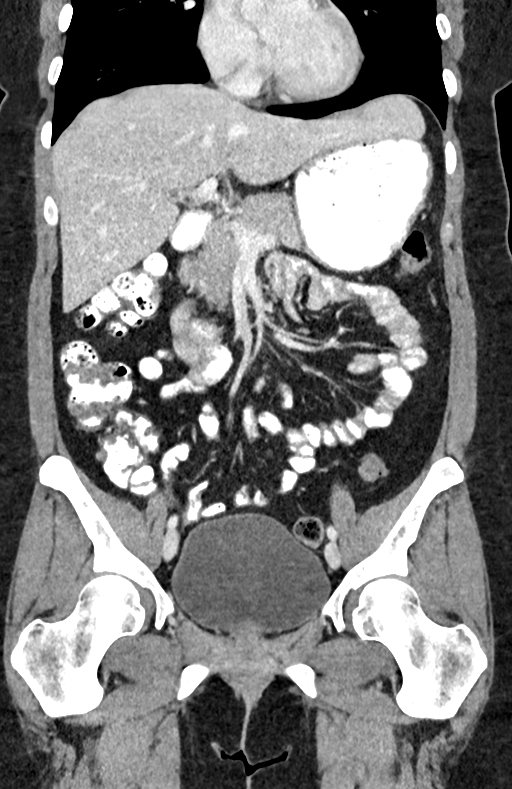
[im 72/130  soft-tissue]
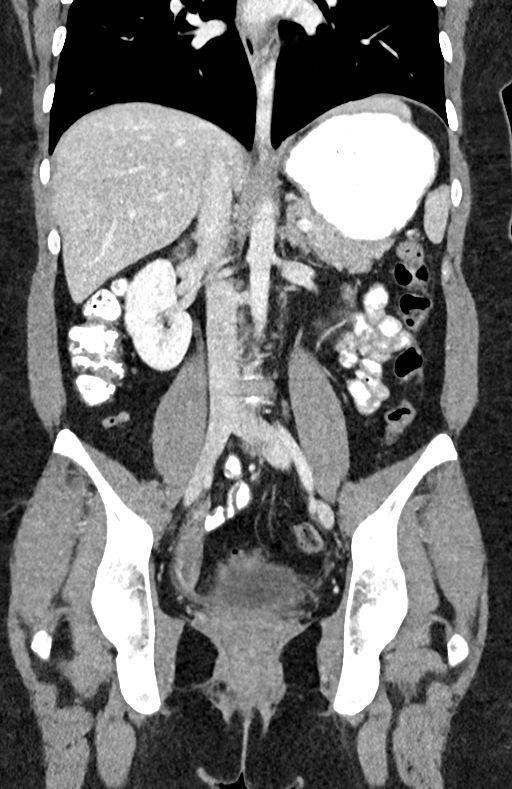

[16 of 46 positions shown; findings below may reference images not displayed]

FINDINGS: Lower Chest: No acute findings.

Hepatobiliary: No hepatic masses identified. Gallbladder is
unremarkable.

Pancreas:  No mass or inflammatory changes.

Spleen: Within normal limits in size and appearance.

Adrenals/Urinary Tract: No masses identified. Tiny right renal cyst
noted. No evidence of ureteral calculi or hydronephrosis.
Unremarkable unopacified urinary bladder.

Stomach/Bowel: No evidence of obstruction, inflammatory process or
abnormal fluid collections. Normal appendix visualized.

Vascular/Lymphatic: No pathologically enlarged lymph nodes. No
abdominal aortic aneurysm.

Reproductive: Prior hysterectomy noted. Adnexal regions are
unremarkable in appearance.

Other:  None.

Musculoskeletal:  No suspicious bone lesions identified.
IMPRESSION: Stable exam. No acute findings or other significant abnormality
identified.

## 2018-07-11 MED ORDER — IOPAMIDOL (ISOVUE-300) INJECTION 61%
100.0000 mL | Freq: Once | INTRAVENOUS | Status: AC | PRN
  Administered 2018-07-11: 100 mL via INTRAVENOUS

## 2018-08-08 LAB — HM COLONOSCOPY

## 2018-08-27 ENCOUNTER — Ambulatory Visit (HOSPITAL_COMMUNITY)
Admission: EM | Admit: 2018-08-27 | Discharge: 2018-08-27 | Disposition: A | Discharge status: Home / Self Care | Payer: PRIVATE HEALTH INSURANCE | Attending: Family Medicine | Admitting: Family Medicine

## 2018-08-27 ENCOUNTER — Encounter (HOSPITAL_COMMUNITY): Payer: Self-pay | Admitting: *Deleted

## 2018-08-27 ENCOUNTER — Other Ambulatory Visit: Payer: Self-pay

## 2018-08-27 DIAGNOSIS — G43019 Migraine without aura, intractable, without status migrainosus: Secondary | ICD-10-CM | POA: Insufficient documentation

## 2018-08-27 MED ORDER — DEXAMETHASONE SODIUM PHOSPHATE 10 MG/ML IJ SOLN
10.0000 mg | Freq: Once | INTRAMUSCULAR | Status: AC
  Administered 2018-08-27: 10 mg via INTRAMUSCULAR

## 2018-08-27 MED ORDER — ONDANSETRON 4 MG PO TBDP
ORAL_TABLET | ORAL | Status: AC
  Filled 2018-08-27: qty 1

## 2018-08-27 MED ORDER — ONDANSETRON 4 MG PO TBDP
4.0000 mg | ORAL_TABLET | Freq: Once | ORAL | Status: AC
  Administered 2018-08-27: 4 mg via ORAL

## 2018-08-27 MED ORDER — DEXAMETHASONE SODIUM PHOSPHATE 10 MG/ML IJ SOLN
INTRAMUSCULAR | Status: AC
  Filled 2018-08-27: qty 1

## 2018-08-27 NOTE — Discharge Instructions (Addendum)
[facility] is an alternative

## 2018-08-27 NOTE — ED Provider Notes (Signed)
[facility]    CSN: [id number] Arrival date & time: 08/27/18  1735     History   Chief Complaint Chief Complaint  Patient presents with  . Migraine    HPI Annette Ellison is a 33 y.o. female.   This is an established 33 year old woman who comes into the [facility] urgent care complaining of migraine headache onset 3 days ago.  Has had a chronic migraine problem with multiple migraines a month.  This, however, is the first time she has had to stop in urgent care for medication.  She is recently been restarted on Topamax.  She tried some rizatriptan which made her headache worse.  In the past patient has tried Botox injections.  She had been on Topamax for a while in the past which did not really work well but now she is try a different formulation of the medicine.     Past Medical History:  Diagnosis Date  . Allergic rhinitis   . Depression   . Elevated cholesterol   . Frequency of urination   . GERD (gastroesophageal reflux disease)   . HA (headache)   . Osteoarthritis   . Pelvic pain in female   . Urgency of urination   . Wears contact lenses     Patient Active Problem List   Diagnosis Date Noted  . Insomnia 06/18/2015  . HA (headache)     Past Surgical History:  Procedure Laterality Date  . ABDOMINAL HYSTERECTOMY  2012   W/  LEFT SALPINGOOPHORECTOMY  . CESAREAN SECTION  08-12-2010  . CYSTO WITH HYDRODISTENSION N/A 03/15/2014   Procedure: CYSTO/HYDRODISTENSION OF BLADDER/INSTALLATION of marcaine and pyridium;  Surgeon: Martina SinnerScott A MacDiarmid, MD;  [facility] [place];  Service: Urology;  Laterality: N/A;  . LAPAROSCOPY W/ OVARIAN CYSTECTOMY  2010    OB History   No obstetric history on file.      Home Medications    Prior to Admission medications   Medication Sig Start Date End Date Taking? Authorizing Provider  tiZANidine (ZANAFLEX) 4 MG capsule Take 4 mg by mouth 2 (two) times daily as needed for muscle spasms.    [provider]  topiramate (TOPAMAX) 100 MG tablet Take 2 tablets (200 mg total) by mouth every evening. 05/02/18   Howard PouchFeng, Lauren, MD    Family History Family History  Problem Relation Age of Onset  . Migraines Mother   . High Cholesterol Mother   . High blood pressure Mother   . Mental illness Other   . Stroke Other   . Diabetes Other     Social History Social History   Tobacco Use  . Smoking status: Never Smoker  . Smokeless tobacco: Never Used  Substance Use Topics  . Alcohol use: Not Currently    Alcohol/week: 1.0 standard drinks    Types: 1 Glasses of wine per week    Comment: OCC  . Drug use: No     Allergies   Penicillins; Pineapple; Other; and Vicodin [hydrocodone-acetaminophen]   Review of Systems Review of Systems   Physical Exam Triage Vital Signs ED Triage Vitals  Enc Vitals Group     BP 08/27/18 1757 123/80     Pulse Rate 08/27/18 1757 94     Resp 08/27/18 1757 18     Temp 08/27/18 1757 98.5 F (36.9 C)     Temp Source 08/27/18 1757 Oral     SpO2 08/27/18 1757 100 %     Weight --  Height --      Head Circumference --      Peak Flow --      Pain Score 08/27/18 1755 10     Pain Loc --      Pain Edu? --      Excl. in GC? --    No data found.  Updated Vital Signs BP 123/80 (BP Location: Right Arm)   Pulse 94   Temp 98.5 F (36.9 C) (Oral)   Resp 18   SpO2 100%    Physical Exam Vitals signs and nursing note reviewed.  Constitutional:      Appearance: Normal appearance.  HENT:     Head: Normocephalic and atraumatic.     Nose: Nose normal.     Mouth/Throat:     Mouth: Mucous membranes are moist.  Eyes:     Extraocular Movements: Extraocular movements intact.     Pupils: Pupils are equal, round, and reactive to light.  Neck:     Musculoskeletal: Normal range of motion and neck supple.  Pulmonary:     Effort: Pulmonary effort is normal.  Musculoskeletal: Normal range of motion.  Skin:    General: Skin is warm and dry.    Neurological:     General: No focal deficit present.     Mental Status: She is alert and oriented to person, place, and time.      UC Treatments / Results  Labs (all labs ordered are listed, but only abnormal results are displayed) Labs Reviewed - No data to display  EKG None  Radiology No results found.  Procedures Procedures (including critical care time)  Medications Ordered in UC Medications  dexamethasone (DECADRON) injection 10 mg (10 mg Intramuscular Given 08/27/18 1853)  ondansetron (ZOFRAN-ODT) disintegrating tablet 4 mg (4 mg Oral Given 08/27/18 1853)    Initial Impression / Assessment and Plan / UC Course  I have reviewed the triage vital signs and the nursing notes.  Pertinent labs & imaging results that were available during my care of the patient were reviewed by me and considered in my medical decision making (see chart for details).    Final Clinical Impressions(s) / UC Diagnoses   Final diagnoses:  Intractable migraine without aura and without status migrainosus     Discharge Instructions     [facility] is an alternative    ED Prescriptions    None     Controlled Substance Prescriptions [place] Controlled Substance Registry consulted? Not Applicable   Elvina SidleLauenstein, Alastair Hennes, MD 08/27/18 [phone]

## 2018-08-27 NOTE — ED Triage Notes (Signed)
C/o migraine headache onset 3 days ago.

## 2018-10-11 ENCOUNTER — Ambulatory Visit (HOSPITAL_COMMUNITY)
Admission: RE | Admit: 2018-10-11 | Discharge: 2018-10-11 | Disposition: A | Discharge status: Home / Self Care | Payer: PRIVATE HEALTH INSURANCE | Source: Ambulatory Visit | Attending: Family Medicine | Admitting: Family Medicine

## 2018-10-11 ENCOUNTER — Ambulatory Visit: Payer: PRIVATE HEALTH INSURANCE | Admitting: Student in an Organized Health Care Education/Training Program

## 2018-10-11 ENCOUNTER — Other Ambulatory Visit: Payer: Self-pay

## 2018-10-11 ENCOUNTER — Encounter: Payer: Self-pay | Admitting: Student in an Organized Health Care Education/Training Program

## 2018-10-11 VITALS — BP 116/72 | HR 95 | Temp 98.2°F | Ht 63.0 in | Wt 164.8 lb

## 2018-10-11 DIAGNOSIS — M4120 Other idiopathic scoliosis, site unspecified: Secondary | ICD-10-CM | POA: Diagnosis present

## 2018-10-11 DIAGNOSIS — M62830 Muscle spasm of back: Secondary | ICD-10-CM | POA: Diagnosis not present

## 2018-10-11 IMAGING — CR DG LUMBAR SPINE COMPLETE 4+V
5 series · 5 of 5 positions shown · non-contrast
Comparison: Abdomen and pelvis CT dated [DATE].

CLINICAL DATA: Left low back and bilateral hip pain. Chronic low
back pain.

EXAM:
LUMBAR SPINE - COMPLETE 4+ VIEW

[l-spine ap]
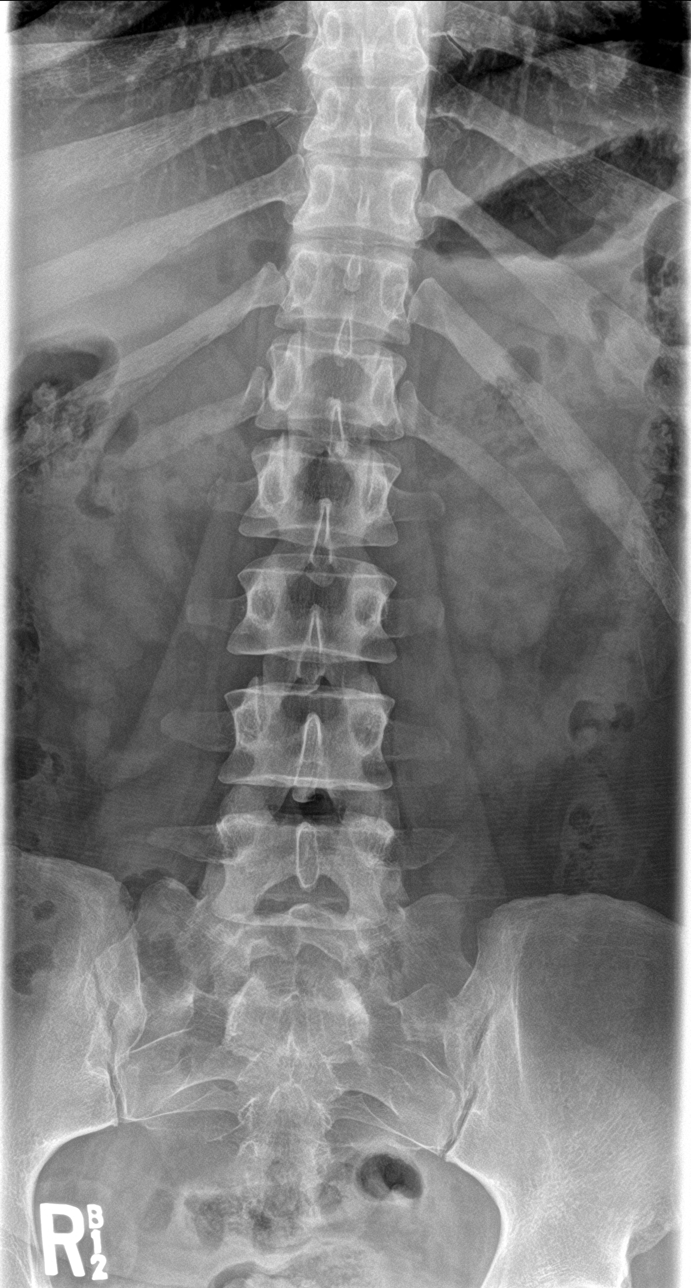

[l-spine obl (1 of 2)]
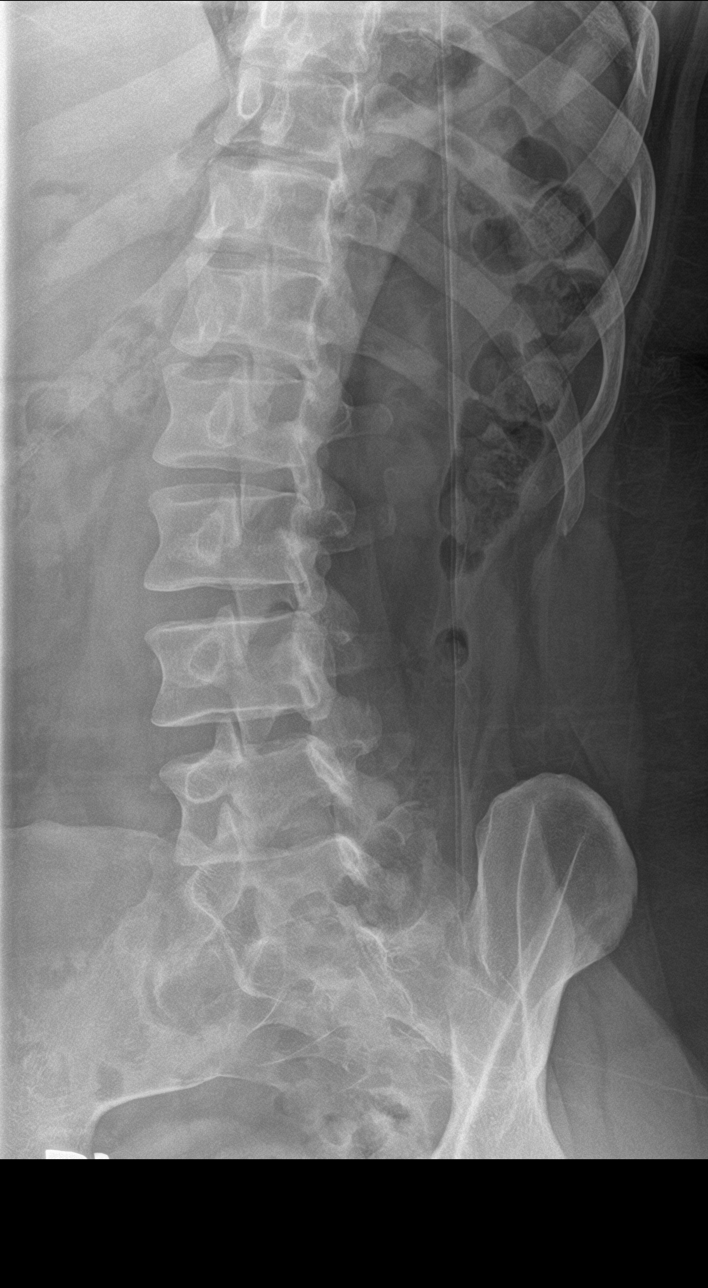

[l-spine obl (2 of 2)]
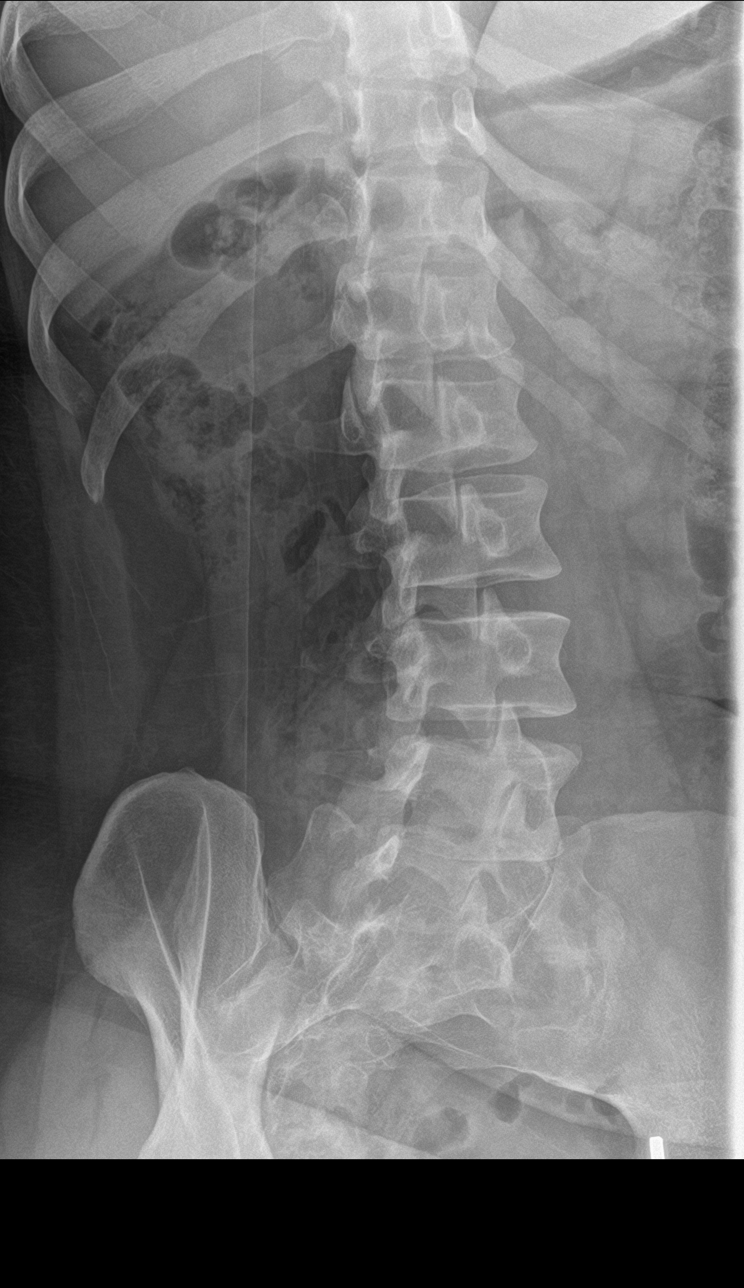

[l-spine lat]
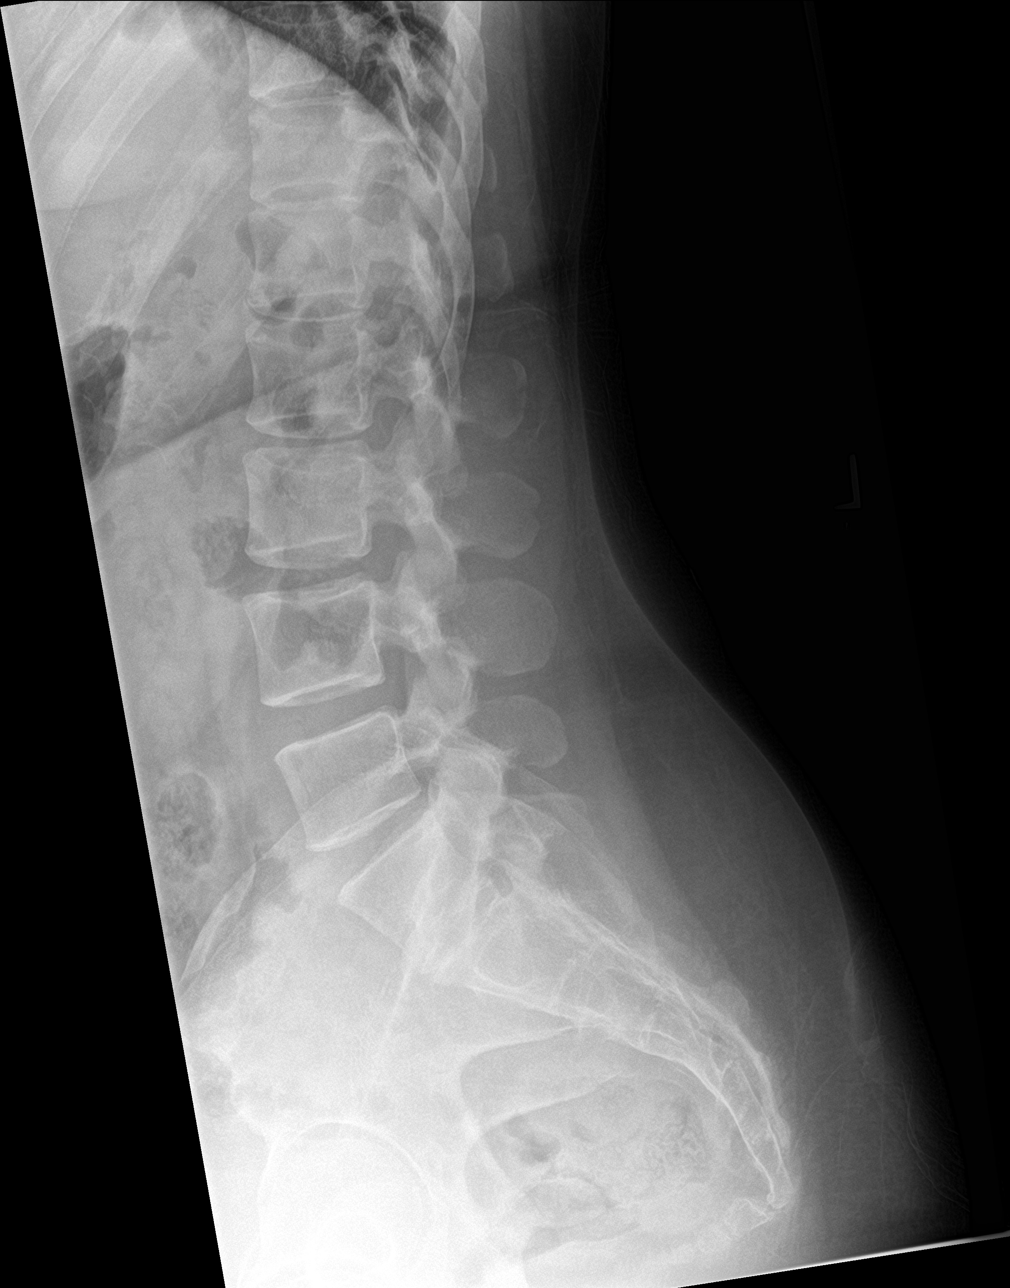

[l-spine spot]
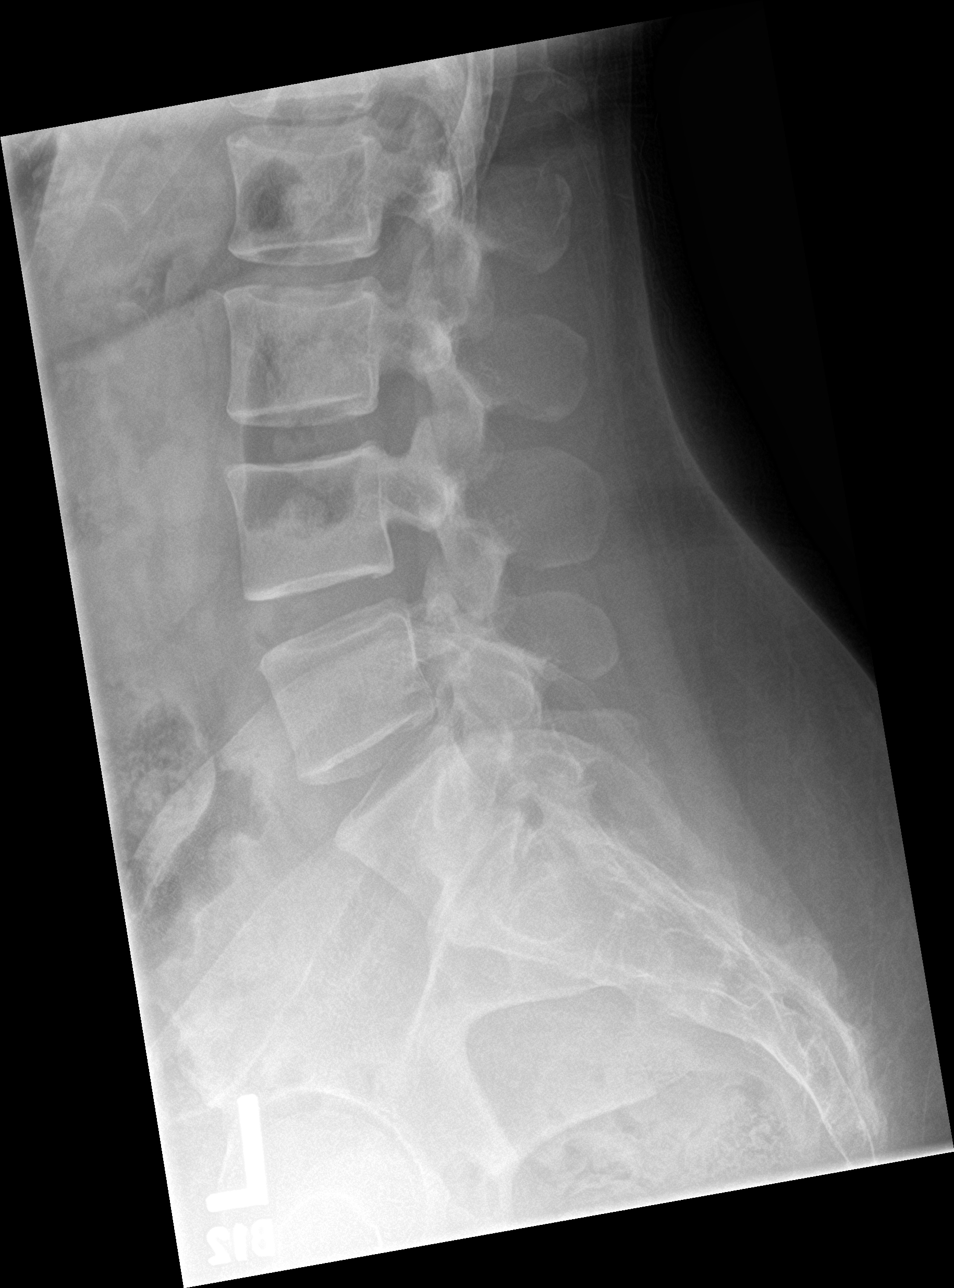

[5 of 5 positions shown; findings below may reference images not displayed]

FINDINGS: Transitional thoracolumbar and lumbosacral vertebrae with 4
intervening lumbar vertebrae. These have normal appearances. No spur
formation, fractures or subluxations.
IMPRESSION: Normal examination.

## 2018-10-11 IMAGING — CR DG THORACIC SPINE 2V
2 series · 2 of 2 positions shown · non-contrast
Comparison: None.

CLINICAL DATA: Chronic back pain.

EXAM:
THORACIC SPINE 2 VIEWS

[t-spine ap]
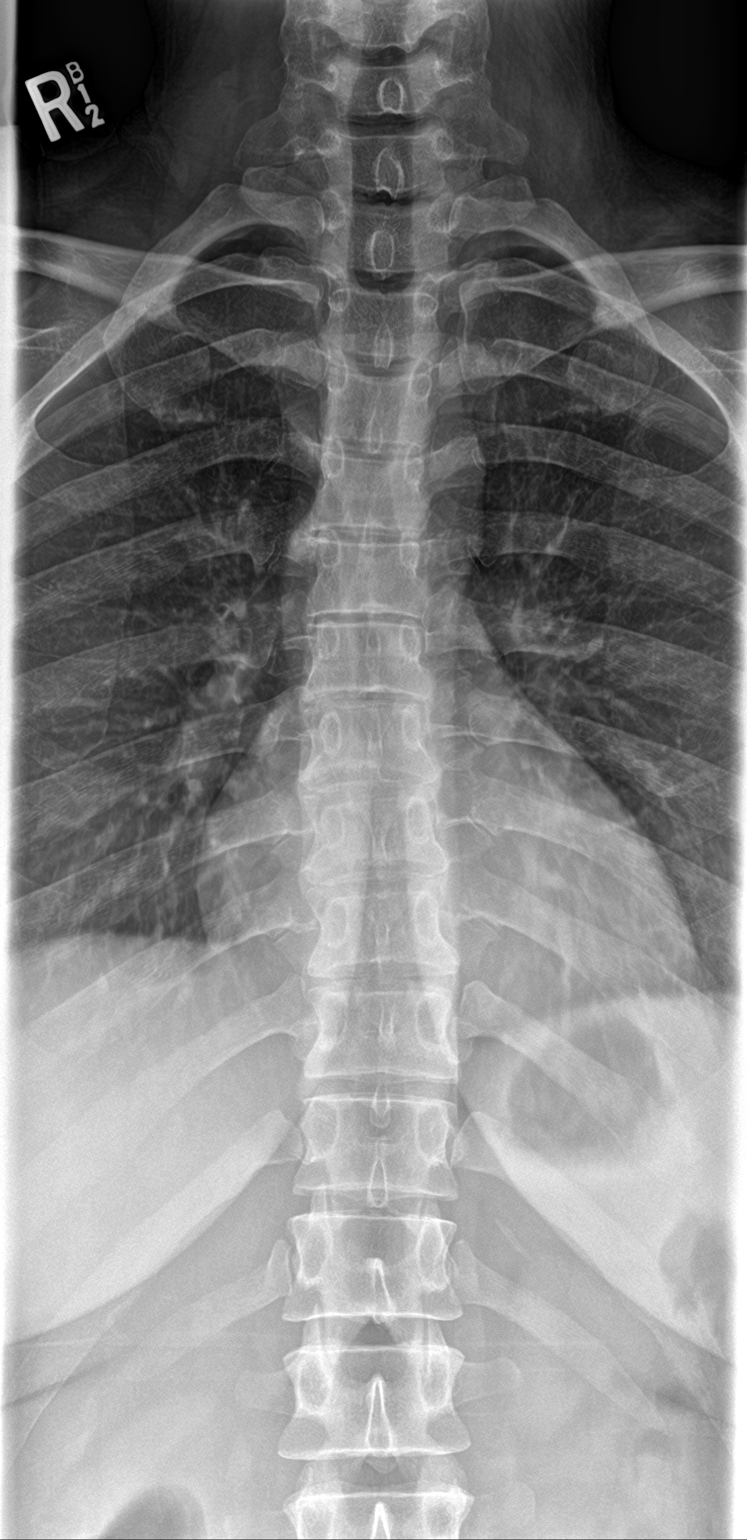

[t-spine lat]
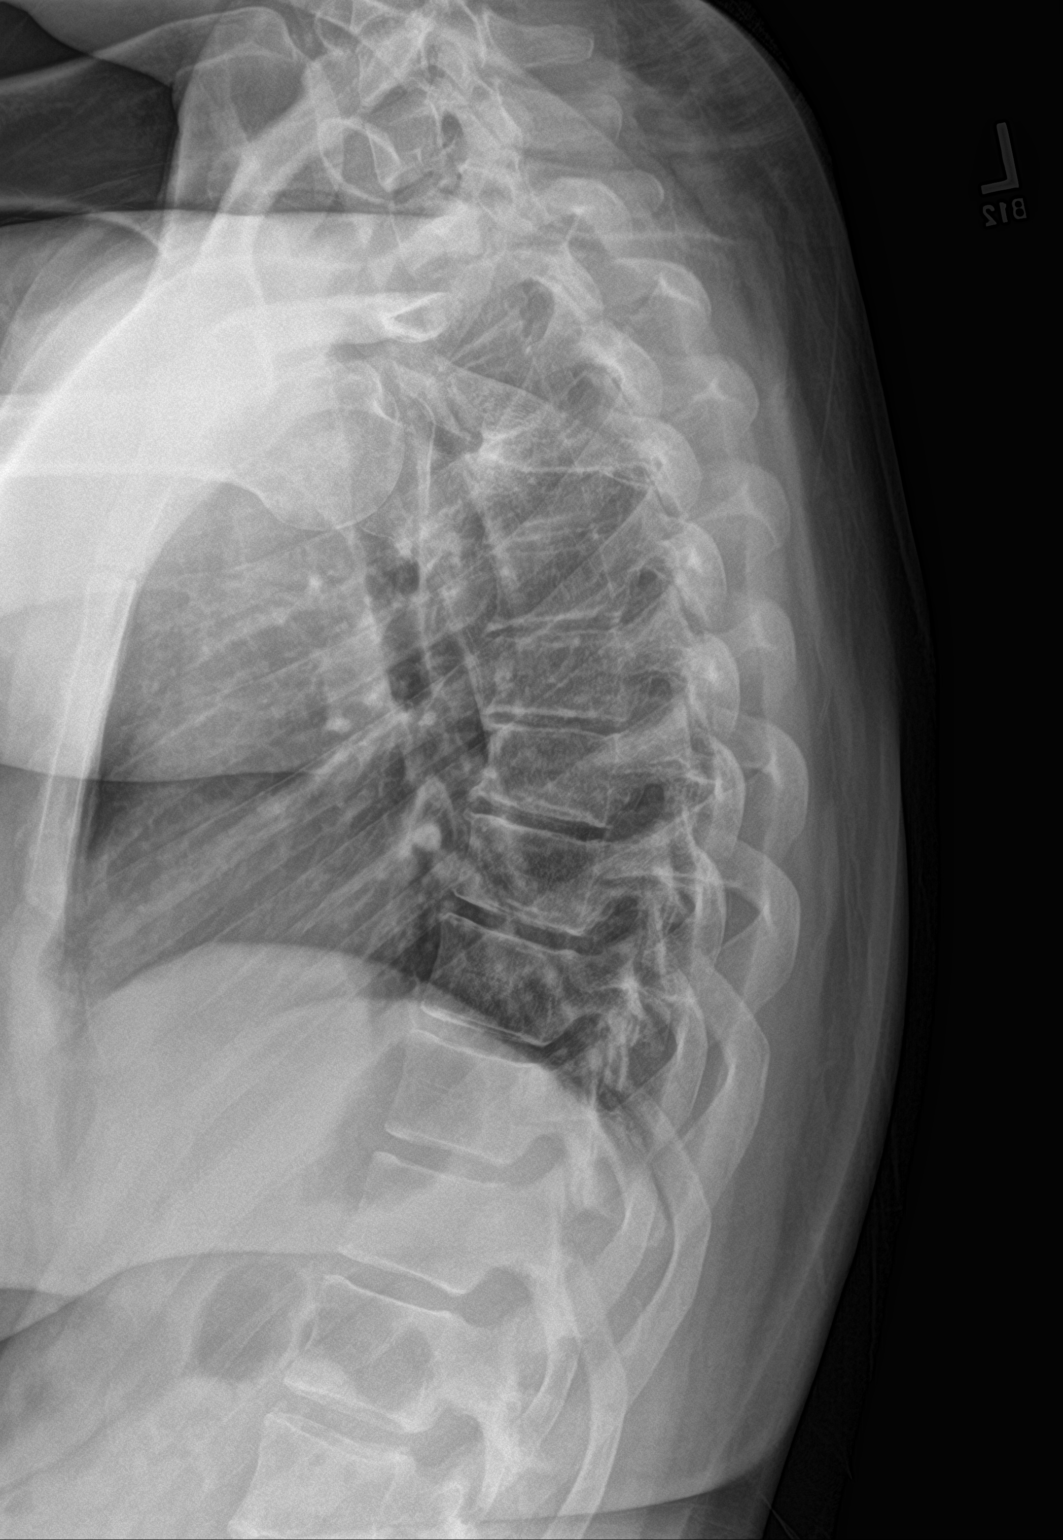

[2 of 2 positions shown; findings below may reference images not displayed]

FINDINGS: Minimal scoliosis. Mild anterior spur formation at multiple levels
of the midthoracic spine. No fractures or subluxations.
IMPRESSION: Mild degenerative changes. No acute abnormality.

## 2018-10-11 IMAGING — CR DG CERVICAL SPINE COMPLETE 4+V
6 series · 6 of 6 positions shown · non-contrast
Comparison: [DATE].

CLINICAL DATA: Chronic upper back and right scapular region pain.

EXAM:
CERVICAL SPINE - COMPLETE 4+ VIEW

[c-spine lat]
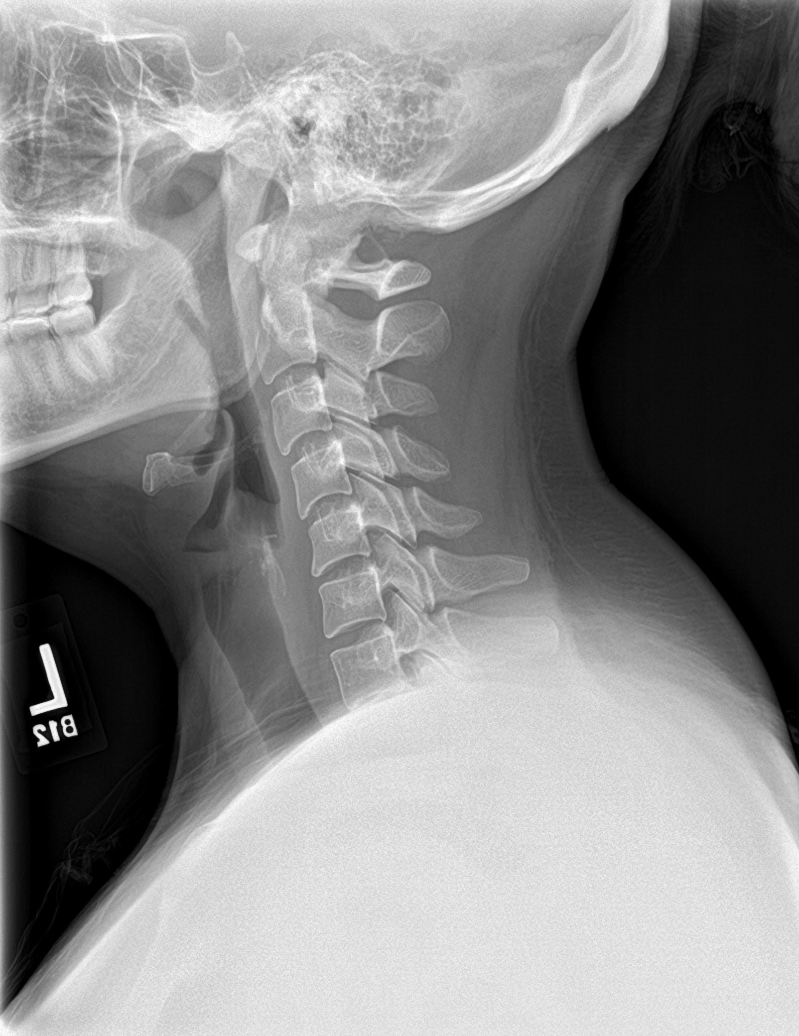

[c-spine obl (1 of 2)]
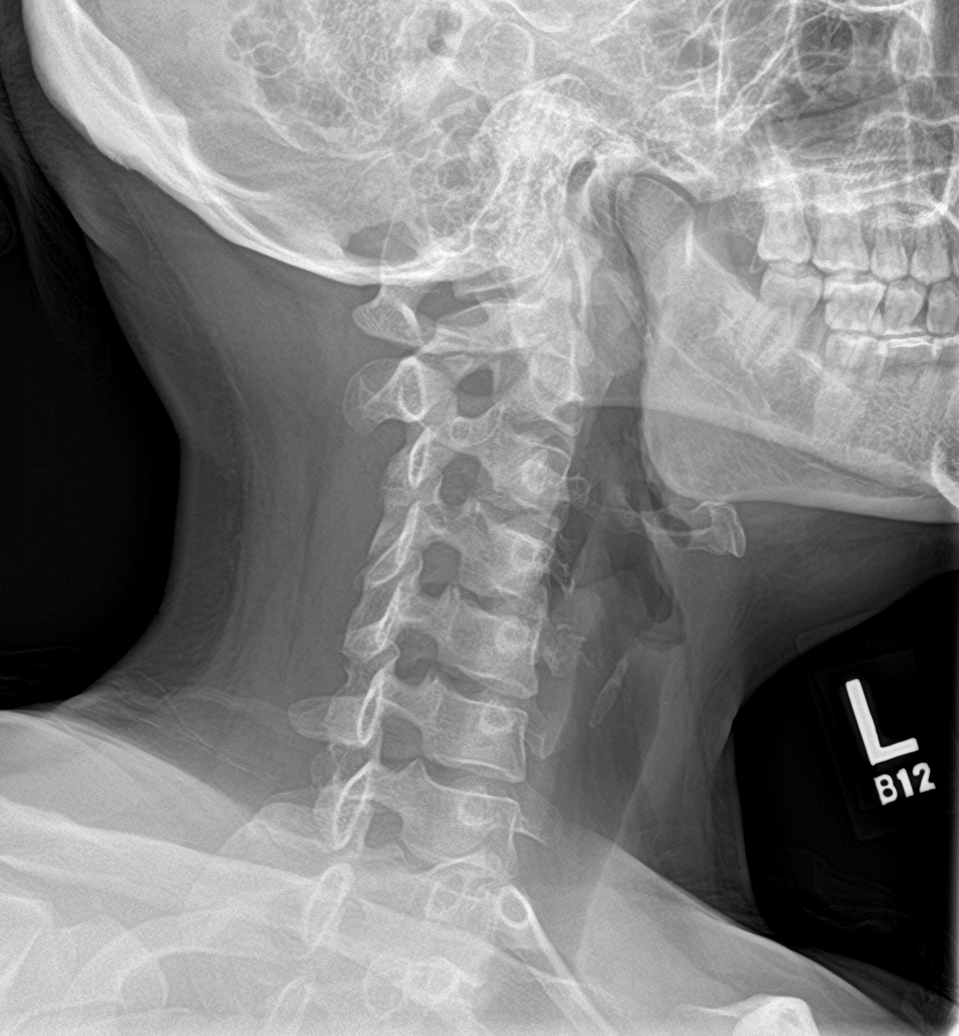

[c-spine ap]
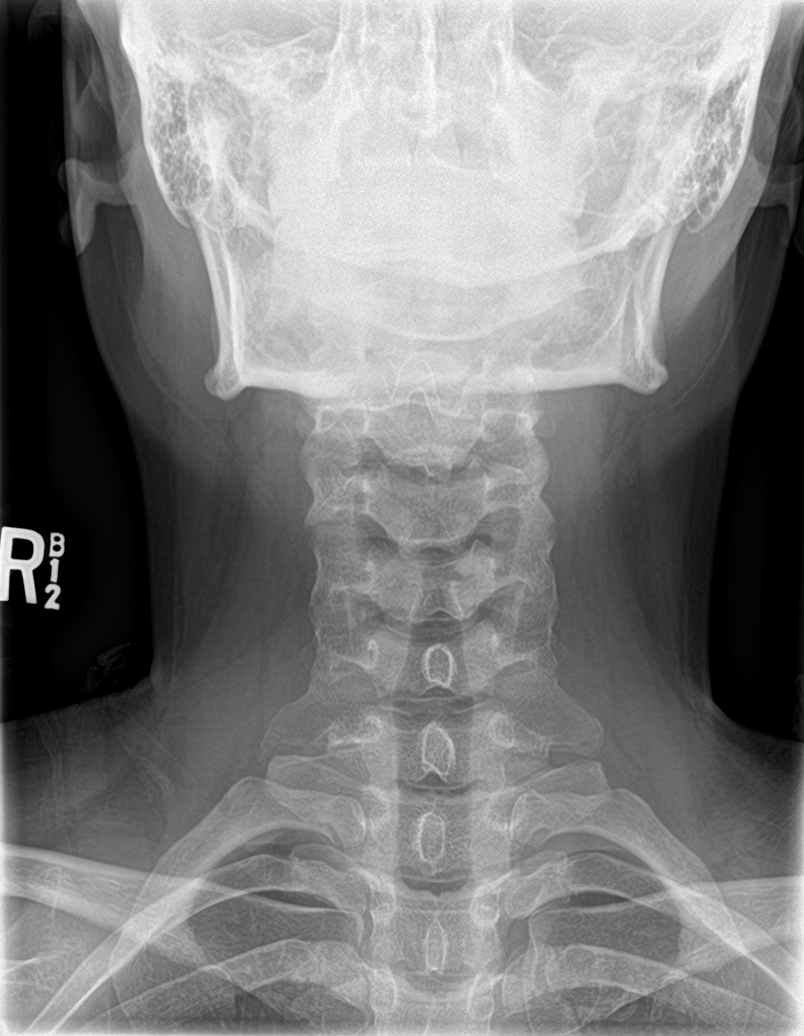

[c-spine swimmers]
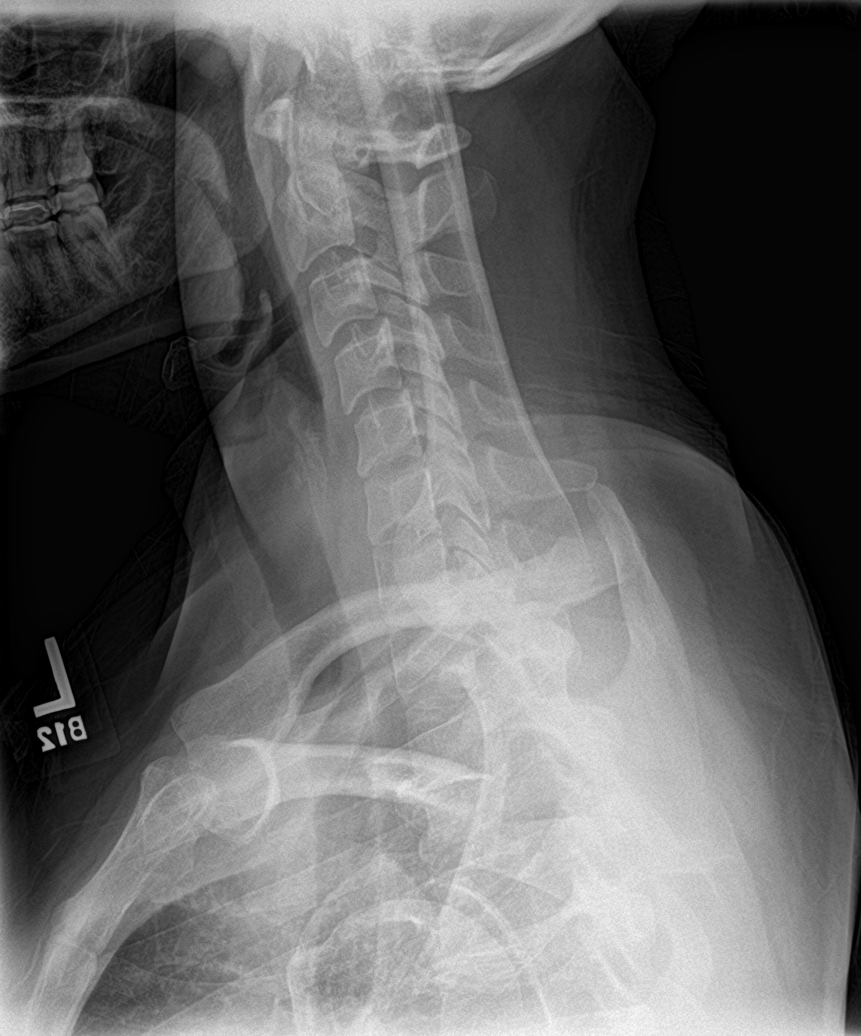

[c-spine obl (2 of 2)]
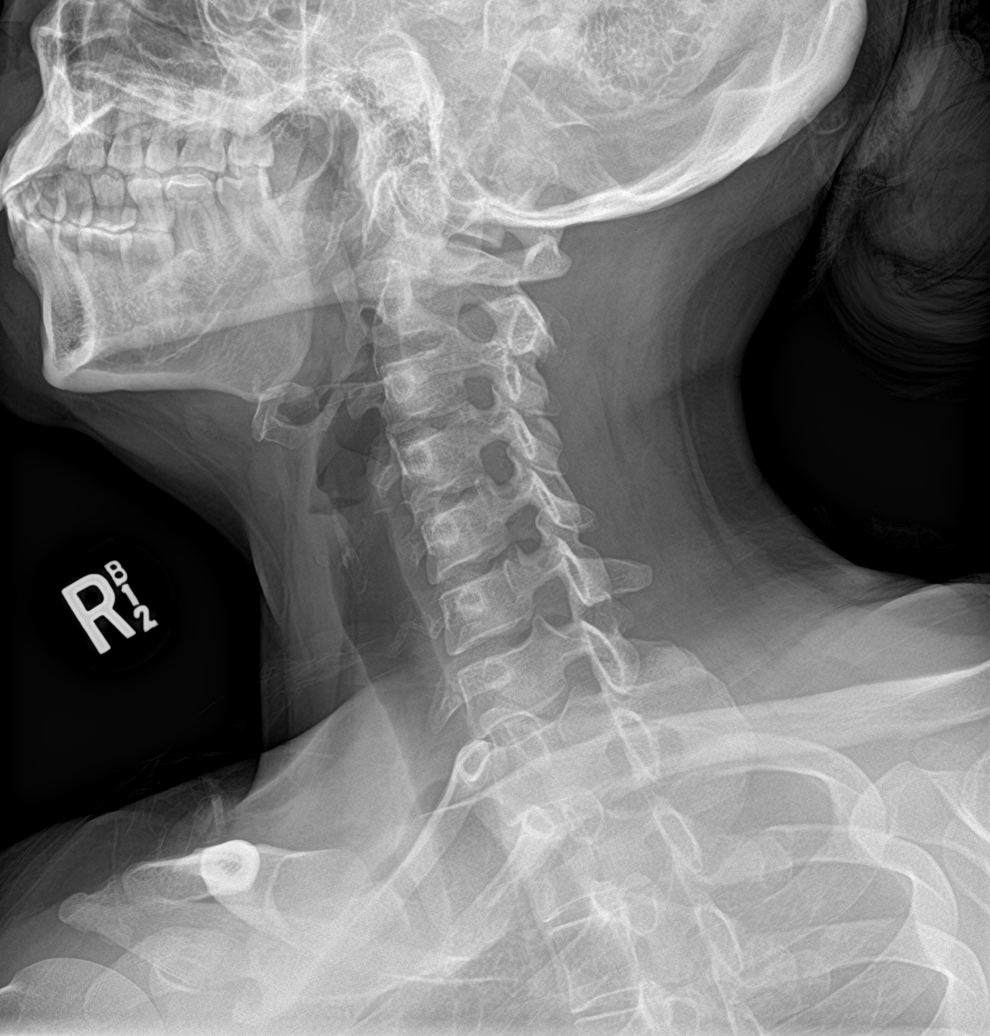

[c-spine open mouth]
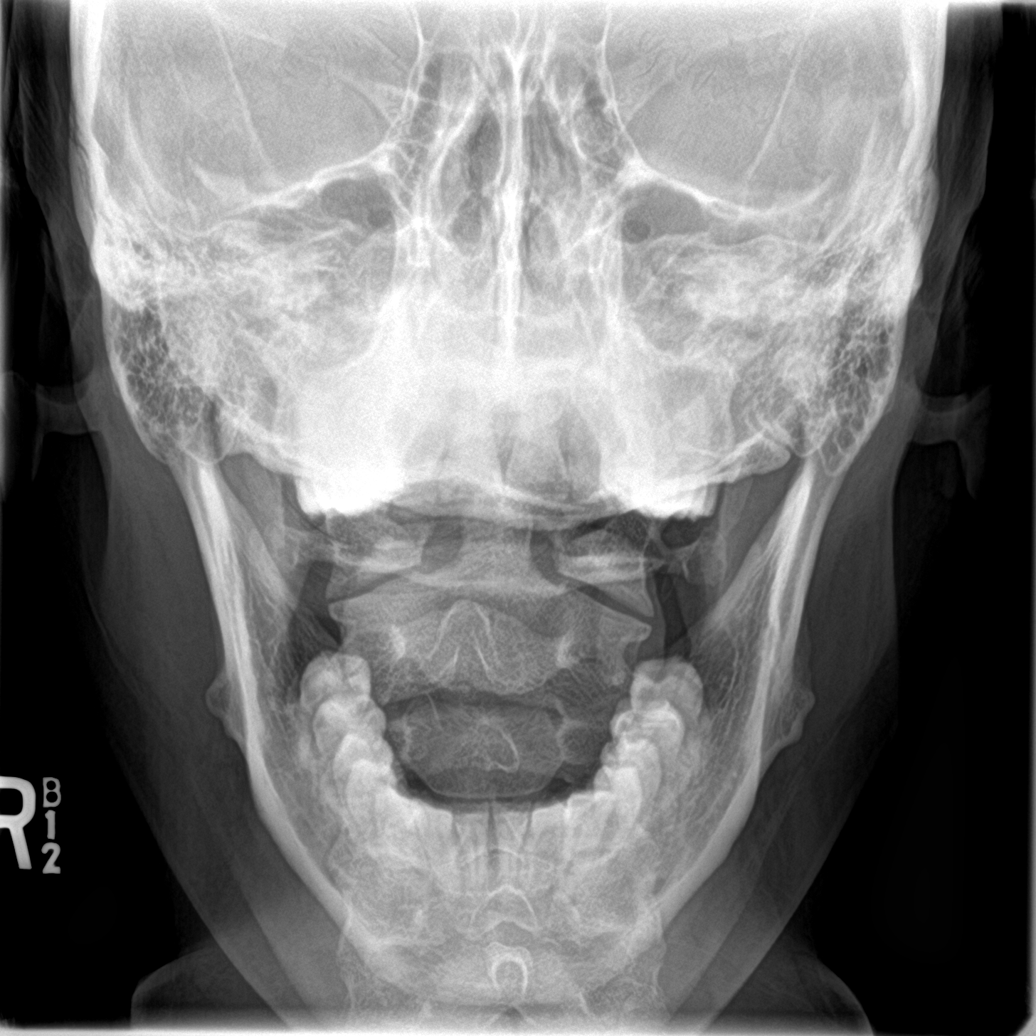

[6 of 6 positions shown; findings below may reference images not displayed]

FINDINGS: Mild reversal of the normal lordosis in the lower cervical spine.
Otherwise, normal appearing bones and soft tissues. No bony
foraminal stenosis seen at any level.
IMPRESSION: Mild reversal of the normal lordosis in the lower cervical spine.
Otherwise, normal examination.

## 2018-10-11 MED ORDER — TIZANIDINE HCL 4 MG PO TABS: 4.0000 mg | ORAL_TABLET | Freq: Four times a day (QID) | ORAL | 0 refills | Status: DC | PRN

## 2018-10-11 NOTE — Assessment & Plan Note (Addendum)
Patient has a history of mild cervical lumbar scoliosis with lumbar straightening lordosis seen on x-ray in 2015.  This may be contributing to her chronic back spasms.  Patient has been having thoracic musculoskeletal pain for at least 4 months, now worsening.  She has notable increased kyphotic curve on exam.  No red flag symptoms or radiculopathy with the back pain. -Short term relief with heating pad, ibuprofen, muscle relaxer -Continue PT exercises at home.  Patient should focus on squeezing her scapula with each of the PT exercises.  Reviewed these with her today. -recommended posture adjusting body helix, and printed an [employer] ad out as an example of a posture sleeve for patient to use while she is working -Recommend seeking out occupational health department at her work for evaluation of her desk, chair, computer to monitor height.  She reports that this service is not available at her office, however is agreeable to occupational health referral today's visit -Given the length of symptoms, offered x-rays at this time.  This will be to rule out more serious causes of back pain or less likely progression of scoliosis.

## 2018-10-11 NOTE — Progress Notes (Signed)
Subjective:    Annette Ellison - 33 y.o. 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  Patient was previously seen for right shoulder pain and underwent subacromial steroid injection which improved her shoulder symptoms, however upper back discomfort has persisted. She reports she has gone to her chiropractor without improvement in symptoms.  She is gone to physical therapy, however reports that she has a $50 co-pay with every session and has been uncomfortable doing the exercises.  She reports that she has exercises to do at home, however they have caused her pain.  She reports discomfort when she sits at her desk at work.  She is tried to make adjustments to her desk including raising the keyboard higher.  She is at her computer throughout her entire workday.  She reports that more recently she has developed pain during the night that has made it difficult to sleep.  She does not have any radiating pain down her arms or legs.  She has not experienced any weakness, numbness or tingling.  She has not had any loss of urine or feces.   Health Maintenance:  Health Maintenance Due  Topic Date Due  . HIV Screening  04/15/2001  . TETANUS/TDAP  04/15/2005  . PAP SMEAR-Modifier  09/12/2013    -  reports that she has never smoked. She has never used smokeless tobacco. - Review of Systems: Per HPI. - Past Medical History: Patient Active Problem List   Diagnosis Date Noted  . Spasm of thoracic back muscle 10/11/2018  . Insomnia 06/18/2015  . HA (headache)    - Medications: reviewed and updated Current Outpatient Medications  Medication Sig Dispense Refill  . tiZANidine (ZANAFLEX) 4 MG tablet Take 1 tablet (4 mg total) by mouth every 6 (six) hours as needed for muscle spasms. 30 tablet 0  . topiramate (TOPAMAX) 100 MG tablet Take 2 tablets (200 mg total) by mouth every evening. 180 tablet 0   No current facility-administered medications for  this visit.     Review of Systems See HPI     Objective:   Physical Exam BP 116/72   Pulse 95   Temp 98.2 F (36.8 C) (Oral)   Ht  (1.6 m)   Wt 164 lb 12.8 oz (74.8 kg)   SpO2 98%   BMI 29.19 kg/m  GEN: NAD, alert, cooperative, and pleasant. RESPIRATORY: Comfortable work of breathing, speaks in full sentences CV: Regular rate noted, distal extremities well perfused and warm without edema GI: Soft, nondistended SKIN: warm and dry, no rashes or lesions NEURO: II-XII grossly intact MSK: Moves 4 extremities equally PSYCH: AAOx3, appropriate affect  Back Exam:  Inspection: Unremarkable  Palpable tenderness: thoracic back pain over trapezius muscle between scapula, no midline  Range of Motion:  Flexion 45 deg; Extension 45 deg; Side Bending to 45 deg bilaterally; Rotation to 45 deg bilaterally  Leg strength: Quad: 5/5 Hamstring: 5/5 Hip flexor: 5/5 Hip abductors: 5/5, arm proximal and distal muscles 5/5 bilaterally Sensory change: Gross sensation intact to all lumbar and sacral dermatomes.  Gait unremarkable. SLR: Negative     Assessment & Plan:   Spasm of thoracic back muscle Patient has a history of mild cervical lumbar scoliosis with lumbar straightening lordosis seen on x-ray in 2015.  This may be contributing to her chronic back spasms.  Patient has been having thoracic musculoskeletal pain for at least 4 months, now worsening.  She  has notable increased kyphotic curve on exam.  No red flag symptoms or radiculopathy with the back pain. -Short term relief with heating pad, ibuprofen, muscle relaxer -Continue PT exercises at home.  Patient should focus on squeezing her scapula with each of the PT exercises.  Reviewed these with her today. -recommended posture adjusting body helix, and printed an [employer] ad out as an example of a posture sleeve for patient to use while she is working -Recommend seeking out occupational health department at her work for evaluation of her  desk, chair, computer to monitor height.  She reports that this service is not available at her office, however is agreeable to occupational health referral today's visit -Given the length of symptoms, offered x-rays at this time.  This will be to rule out more serious causes of back pain or less likely progression of scoliosis.   Orders Placed This Encounter  Procedures  . DG Lumbar Spine Complete    Standing Status:   Future    Number of Occurrences:   1    Standing Expiration Date:   12/10/2019    Order Specific Question:   Reason for Exam (SYMPTOM  OR DIAGNOSIS REQUIRED)    Answer:   back pain    Order Specific Question:   Is patient pregnant?    Answer:   No    Order Specific Question:   Preferred imaging location?    Answer:   [facility]    Order Specific Question:   Radiology Contrast Protocol - do NOT remove file path    Answer:   \\[email or web address]\[email or web address]\Radiant\DXFluoroContrastProtocols.pdf  . DG Cervical Spine Complete    Standing Status:   Future    Number of Occurrences:   1    Standing Expiration Date:   12/10/2019    Order Specific Question:   Reason for Exam (SYMPTOM  OR DIAGNOSIS REQUIRED)    Answer:   back pain    Order Specific Question:   Is patient pregnant?    Answer:   No    Order Specific Question:   Preferred imaging location?    Answer:   [facility]    Order Specific Question:   Radiology Contrast Protocol - do NOT remove file path    Answer:   \\[email or web address]\[email or web address]\Radiant\DXFluoroContrastProtocols.pdf  . DG Thoracic Spine 2 View    Standing Status:   Future    Number of Occurrences:   1    Standing Expiration Date:   12/10/2019    Order Specific Question:   Reason for Exam (SYMPTOM  OR DIAGNOSIS REQUIRED)    Answer:   back pain    Order Specific Question:   Is patient pregnant?    Answer:   No    Order Specific Question:   Preferred imaging location?    Answer:   [facility]    Order Specific Question:   Radiology Contrast Protocol  - do NOT remove file path    Answer:   \\[email or web address]\Radiant\DXFluoroContrastProtocols.pdf  . Ambulatory referral to Occupational Therapy    Referral Priority:   Routine    Referral Type:   Occupational Therapy    Referral Reason:   Specialty Services Required    Requested Specialty:   Occupational Therapy    Number of Visits Requested:   1    Meds ordered this encounter  Medications  . tiZANidine (ZANAFLEX) 4 MG tablet    Sig: Take 1 tablet (4 mg total) by mouth every 6 (six) hours as needed for  muscle spasms.    Dispense:  30 tablet    Refill:  0    Howard Pouch, MD,MS,  PGY3 10/11/2018 10:43 AM

## 2018-10-11 NOTE — Patient Instructions (Signed)
It was a pleasure seeing you today in our clinic.  Here is the treatment plan we have discussed and agreed upon together:  We will start with heat, exercises, ibuprofen and a muscle relaxer.  Please see if your company has an occupational health department for adjustment of your desk. If not, please let me know because we may be able to refer you to occupational health.  Please follow up with me in 4 weeks.  Our clinic's number is [phone]. Please call with questions or concerns about what we discussed today.  Be well, Dr. Mosetta Putt

## 2018-10-13 ENCOUNTER — Encounter: Payer: Self-pay | Admitting: Student in an Organized Health Care Education/Training Program

## 2019-01-17 ENCOUNTER — Encounter: Payer: Self-pay | Admitting: Student in an Organized Health Care Education/Training Program

## 2019-03-27 ENCOUNTER — Ambulatory Visit (INDEPENDENT_AMBULATORY_CARE_PROVIDER_SITE_OTHER): Payer: PRIVATE HEALTH INSURANCE | Admitting: Family Medicine

## 2019-03-27 ENCOUNTER — Other Ambulatory Visit: Payer: Self-pay

## 2019-03-27 ENCOUNTER — Encounter: Payer: Self-pay | Admitting: Family Medicine

## 2019-03-27 VITALS — BP 100/68 | HR 82 | Wt 157.6 lb

## 2019-03-27 DIAGNOSIS — F419 Anxiety disorder, unspecified: Secondary | ICD-10-CM

## 2019-03-27 DIAGNOSIS — F329 Major depressive disorder, single episode, unspecified: Secondary | ICD-10-CM

## 2019-03-27 MED ORDER — SERTRALINE HCL 25 MG PO TABS: 25.0000 mg | ORAL_TABLET | Freq: Every day | ORAL | 1 refills | Status: DC

## 2019-03-27 NOTE — Patient Instructions (Signed)
Thank you for coming to see me today. It was a pleasure to meet you. Today we talked about:   Take the medication every day, at night.  You may see some benefit in 2 weeks, but maximum benefit in 4-6 weeks.  You have have dry mouth, fatigue, dizziness, nausea, or diarrhea as the most common side effects.  Avoid alcohol use with this medication.  Please follow-up with your PCP or me in 4 weeks.  Casimer Lanius, our Behavioral Health specialist will call you sometime in the next few days.  If you have any questions or concerns, please do not hesitate to call the office at [phone].  Best,   [place] Constable, DO

## 2019-03-27 NOTE — Progress Notes (Signed)
Subjective: Chief Complaint  Patient presents with  . Stress and Anxiety     HPI: Annette Ellison is a 33 y.o. presenting to clinic today to discuss the following:  1 Stress and Anxiety Patient reports that since March and start of COVID-19 pandemic, she has been having increased stress at work.  She states that she works at a [employer] and has been dealing with a lot of angry customers who "verbally abuse" her on a daily basis.  She notes that over the past few weeks, she has had worsening in her mood.  Notes increased tearfulness, hopelessness, fatigue.  Notes that she has no motivation to do anything and finds herself "crying all the time" which is out of the ordinary for her.  Denies SI/HI.  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  She states that other than that, no history of depression.  Notes that her mother also has depression, but unsure of what medication she has taken.  She states, "I feel like I need to talk to someone and take care of this before it gets out of control."  She is currently taking some personal time off of work to decrease her stress level.   GAD 7 : Generalized Anxiety Score 03/27/2019  Nervous, Anxious, on Edge 3  Control/stop worrying 2  Worry too much - different things 1  Trouble relaxing 3  Restless 3  Easily annoyed or irritable 3  Afraid - awful might happen 1  Total GAD 7 Score 16  Anxiety Difficulty Somewhat difficult     [facility]  PHQ-9 Total Score  18         ROS noted in HPI.   Past Medical, Surgical, Social, and Family History Reviewed & Updated per EMR.   Pertinent Historical Findings include:   Social History   Tobacco Use  Smoking Status Never Smoker  Smokeless Tobacco Never Used      Objective: BP 100/68   Pulse 82   Wt 157 lb 9.6 oz (71.5 kg)   SpO2 99%   BMI 27.92 kg/m  Vitals and nursing notes reviewed   Physical Exam:  General: 33 y.o. female in NAD Cardio: RRR no m/r/g Lungs: CTAB, no wheezing, no rhonchi, no crackles, no IWOB on RA Skin: warm and dry Extremities: Moves all four extremities equally, normal gait Psych: mildly dysthymic mood, no SI/HI, thought process linear and logical, no visual or auditory hallucinations   No results found for this or any previous visit (from the past 72 hour(s)).  Assessment/Plan:  Anxiety and depression GAD-7 and PHQ-9 significant for anxiety and depression.  No SI/HI, patient is no immediate danger to herself or others.  Will send message to Sammuel Hineseborah Moore regarding therapy as patient is open to this.  Start Zoloft 25mg  QD and titrate up as needed. Patient advised of possible side effects and treatment expectations.  Return 4 weeks.     PATIENT EDUCATION PROVIDED: See AVS    Diagnosis and plan along with any newly prescribed medication(s) were discussed in detail with this patient today. The patient verbalized understanding and agreed with the plan. Patient advised if symptoms worsen return to clinic or ER.   Health Maintainance:   No orders of the defined types were placed in this encounter.   Meds ordered this encounter  Medications  . sertraline (ZOLOFT) 25 MG tablet  Sig: Take 1 tablet (25 mg total) by mouth daily.    Dispense:  30 tablet    Refill:  [place], DO 03/28/2019, 7:00 AM PGY-2 [place]

## 2019-03-28 ENCOUNTER — Encounter: Payer: Self-pay | Admitting: Family Medicine

## 2019-03-28 DIAGNOSIS — F411 Generalized anxiety disorder: Secondary | ICD-10-CM | POA: Insufficient documentation

## 2019-03-28 DIAGNOSIS — F419 Anxiety disorder, unspecified: Secondary | ICD-10-CM | POA: Insufficient documentation

## 2019-03-28 DIAGNOSIS — F329 Major depressive disorder, single episode, unspecified: Secondary | ICD-10-CM | POA: Insufficient documentation

## 2019-03-28 NOTE — Assessment & Plan Note (Signed)
GAD-7 and PHQ-9 significant for anxiety and depression.  No SI/HI, patient is no immediate danger to herself or others.  Will send message to Casimer Lanius regarding therapy as patient is open to this.  Start Zoloft 25mg  QD and titrate up as needed. Patient advised of possible side effects and treatment expectations.  Return 4 weeks.

## 2019-03-30 ENCOUNTER — Telehealth: Payer: Self-pay | Admitting: Licensed Clinical Social Worker

## 2019-03-30 NOTE — Telephone Encounter (Signed)
   Care Coordination Social Work Note  03/30/2019 Name: Annette Ellison MRN: [id number] DOB: 1986/02/02  [name] is a 33 y.o. year old female who sees Maudie Mercury, Charlyne Quale, MD for primary care. LCSW was consulted by  Cleophas Dunker, DO for assistance with Mental Health Counseling and Resources for patient.   Called patient to assess needs. Patient has started taking zoloft as prescribed by provider and reports no major symptoms other than dry mouth. Discussed therapy options and offered brief interventions with managing stress to assist with care coordination.   Follow Up Plan: Phone appointment scheduled with LCSW Aug. 18th at [place], [place] / [place]   [phone] 2:39 PM

## 2019-04-04 ENCOUNTER — Other Ambulatory Visit: Payer: Self-pay

## 2019-04-04 ENCOUNTER — Ambulatory Visit (INDEPENDENT_AMBULATORY_CARE_PROVIDER_SITE_OTHER): Payer: PRIVATE HEALTH INSURANCE | Admitting: Licensed Clinical Social Worker

## 2019-04-04 DIAGNOSIS — F329 Major depressive disorder, single episode, unspecified: Secondary | ICD-10-CM

## 2019-04-04 DIAGNOSIS — F32A Depression, unspecified: Secondary | ICD-10-CM

## 2019-04-04 DIAGNOSIS — Z7189 Other specified counseling: Secondary | ICD-10-CM

## 2019-04-04 DIAGNOSIS — F419 Anxiety disorder, unspecified: Secondary | ICD-10-CM

## 2019-04-04 NOTE — BH Specialist Note (Addendum)
Initial goal documentation Care Management    Clinical Social Work INITIAL Behavioral Health Screening Note  04/04/2019 Name: Annette Ellison MRN: [id number] DOB: 10/28/1985  [name] is a 33 y.o. year old female who sees Maudie Mercury, Charlyne Quale, MD for primary care. LCSW was  consult the patient for assistance with Mental Health Counseling and Resources.   Review of patient status, including review of consultants reports, relevant laboratory and other test results, and collaboration with appropriate care team members and the patient's provider was performed as part of comprehensive patient evaluation and provision of chronic care management services.   Patient verbally consented to talk with LCSW about presenting concerns.  Report of symptoms: decrease in crying spells, able to concentrate more,  Side effects: dry mouth, headache, thinks the medication is making her more irritable   ASSESSMENT: Patient is currently experiencing symptoms of depression.  Patient also reported having Adverse child [facility] Experiences which she has never addressed and is now negatively impacting her and exacerbating her symptoms of depression.  Patient may benefit from and is in agreement to receive further assessment and ongoing therapeutic interventions to assist with managing her symptoms.   Depression screen [facility] 2/9 03/27/2019 10/11/2018 05/25/2018  Decreased Interest 2 0 0  Down, Depressed, Hopeless 2 0 0  PHQ - 2 Score 4 0 0  Altered sleeping 3 - -  Tired, decreased energy 3 - -  Change in appetite 3 - -  Feeling bad or failure about yourself  0 - -  Trouble concentrating 3 - -  Moving slowly or fidgety/restless 2 - -  Suicidal thoughts 0 - -  PHQ-9 Score 18 - -  Difficult doing work/chores Somewhat difficult - -     GAD 7 : Generalized Anxiety Score 03/27/2019  Nervous, Anxious, on Edge 3  Control/stop worrying 2  Worry too much - different things 1  Trouble relaxing 3  Restless 3  Easily annoyed or  irritable 3  Afraid - awful might happen 1  Total GAD 7 Score 16  Anxiety Difficulty Somewhat difficult     Goals Addressed            This Visit's Progress   . Patient Stated (pt-stated)       Connect with ongoing therapist  Current Barriers:  . Lacks knowledge of community resource:   Clinical Social Work Clinical Goal(s):  Marland Kitchen Over the new 30 days LCSW will assist patient with connecting to therapy resources Interventions: . Patient interviewed and appropriate assessments performed . Provided mental health counseling resources  . Advised patient to contact her insurance for in-network providers Patient Self Care Activities:  . Continue to take medication as per PCP . Contact [facility]   Initial goal documentation      . Patient Stated       Other goals 1. Reduce symptoms of: depression 2. Increase knowledge and/or ability of: coping skills, self-management skills, and stress reduction  Current Barriers:  . None identified Clinical Social Work Clinical Goal(s):     Over the next 3 to 4 weeks LCSW will provide brief interventions so assist with reducing symptoms of depression Interventions: . Patient interviewed and appropriate assessments performed . Provided mental health counseling with regard to managing symptoms of depression (mental health diagnosis or concern) . Provided patient with information about relaxation tech . Advised patient to review information sent from [facility]  on Depression and Relaxation Techniques  Other interventions: . Solution-Focused Strategies,   . Consult  MD  Patient Self Care Activities:  . Relaxed breathing 3 times daily Plan: Appointment scheduled for SW follow up with client by phone on:      Aug. 25th at 11:30   Initial goal documentation        Follow Up Plan: Next PCP appointment scheduled for: Aug. 24th   PCP has been notified of patient's concern with Medication   Sammuel Hineseborah Ilamae Geng, LCSW Clinical Social Worker  [facility]   [phone] 12:44 PM

## 2019-04-10 ENCOUNTER — Other Ambulatory Visit: Payer: Self-pay

## 2019-04-10 ENCOUNTER — Encounter: Payer: Self-pay | Admitting: Family Medicine

## 2019-04-10 ENCOUNTER — Ambulatory Visit: Payer: PRIVATE HEALTH INSURANCE | Admitting: Family Medicine

## 2019-04-10 VITALS — BP 108/82 | HR 69 | Wt 155.0 lb

## 2019-04-10 DIAGNOSIS — M25511 Pain in right shoulder: Secondary | ICD-10-CM

## 2019-04-10 DIAGNOSIS — F419 Anxiety disorder, unspecified: Secondary | ICD-10-CM

## 2019-04-10 DIAGNOSIS — Z0000 Encounter for general adult medical examination without abnormal findings: Secondary | ICD-10-CM

## 2019-04-10 DIAGNOSIS — M62830 Muscle spasm of back: Secondary | ICD-10-CM | POA: Diagnosis not present

## 2019-04-10 DIAGNOSIS — F329 Major depressive disorder, single episode, unspecified: Secondary | ICD-10-CM

## 2019-04-10 MED ORDER — METHYLPREDNISOLONE ACETATE 40 MG/ML IJ SUSP
40.0000 mg | Freq: Once | INTRAMUSCULAR | Status: AC
  Administered 2019-04-10: 17:00:00 40 mg via INTRAMUSCULAR

## 2019-04-10 NOTE — Progress Notes (Signed)
Established Patient - Acute Visit Patient ID: MRN [id number], [birth year]  PCP: Melene PlanKim, Briget Shaheed E, MD  Subjective  CC: Shoulder Pain (both mainly the right)  YQM:VHQIHPI:Annette Ellison is a 33 y.o. female who presents today with right shoulder pain. Patient reports that she has previously been to the clinic and received shots in her arm that helped with the same pain.  The pain is located pinpoint between her shoulder blades and under her shoulder.  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  She reports that at work, she sits for majority of the day looking at a computer.  She describes the pain is constant and painful to palpation.  She denies any loss of range of motion and any weakness.  She reports that it has previously been better with an injection but is really improved with over-the-counter pain medications.  She does report taking tizanidine and in the past which was helpful.  She requests to get another shot today in her shoulder.   ROS: Pertinent ROS included in HPI.  History: Medications, allergies, medical history, family history and social history were reviewed and edited as necessary.  Social Hx: Annette DieterRosa reports that she has never smoked. She has never used smokeless tobacco. She reports previous alcohol use of about 1.0 standard drinks of alcohol per week. She reports that she does not use drugs.   Objective   Physical Exam:  BP 108/82   Pulse 69   Wt 155 lb (70.3 kg)   SpO2 99%   BMI 27.46 kg/m  Filed Weights   04/10/19 1549  Weight: 155 lb (70.3 kg)  Examination: General: Young female, sitting comfortably on exam table HENT: NCAT, EOMI Lungs: CTA B Cardiovascular: RRR, no M/R/R Abdomen: Positive bowel sounds.  NTND. Extremities: there are no deformities on inspection.  With palpation, patient has pinpoint tenderness under medial scapula when arms are abducted.  Her range of motion is normal both actively and passively.  She does report some pain  when her putting her shoulder in flexion with elbow extended.  She also has pinpoint tenderness to her anterior shoulder at coracoid process.  She has diffuse tenderness along her lats bilaterally.   Physical Exam Musculoskeletal:       Arms:      Pertinent Labs & Imaging:  10/11/18 Cervical Spine: Mild reversal of the normal lordosis in the lower cervical spine. Otherwise, normal examination.  Lumbar: Normal examination. Thoracic: Mild degenerative changes. No acute abnormality.    Assessment  Anxiety and depression Patient was taking sertraline and reports that she is having some headaches with medication.  I would like to readdress this with the patient and she is agreeable to follow-up in a couple of weeks.  Spasm of thoracic back muscle Patient with mild reversal of normal lordosis of cervical cervical spine.  Otherwise no other abnormalities on x-rays of the spine.  This musculoskeletal pain has been chronic and was previously seen by Dr. Louisa SecondFain in February.  At that time she was experiencing it for 4 months.  It is likely that her pinpoint shoulder pain is associated with her occupation and her posture at work daily.  I have encouraged patient to change positions often and perform stretches to help with shoulder pain. Today performed subacromial injection with depomedrol:lidocaine 3:1. Patient to follow up if continued pain. If this doesn't improve with injection, we can rule out subacromial process associated with patient's pain.  - continue to  monitor - PT exercises - OTC NSAIDs PRN pain   Healthcare maintenance Health Maintenance reviewed   Health Maintenance Due  Topic Date Due  . HIV Screening  04/15/2001  . TETANUS/TDAP  04/15/2005  . PAP SMEAR-Modifier  09/12/2013  . INFLUENZA VACCINE  03/18/2019         Follow-up:  Future Appointments  Date Time Provider [place]  04/19/2019  4:00 PM Wilber Oliphant, MD [facility]      Wilber Oliphant, M.D.  PGY-2   Family Medicine  [phone] 04/14/2019 3:05 PM

## 2019-04-10 NOTE — Patient Instructions (Addendum)
Dear Annette Ellison,   It was good to see you! Thank you for taking your time to come in to be seen. Today, we discussed the following:   Back/shoulder pain   Your pain is due to sitting at your desk during the day. It sounds like you have tried to make some adjustments in the past  Make an appointment to follow up with sertraline next week.   Please follow up for concerning or worsening symptoms.   Be well,   Genia Hotterachel , M.D   [facility] [phone]  *Sign up for MyChart for instant access to your health profile, labs, orders, upcoming appointments or to contact your provider with questions*  =================================================================================== Some important items for Annette Ellison's health:    Your Blood Pressure.  Should be LESS THAN 140/90 or 150/90 if you are over 65 BP Readings from Last 3 Encounters:  04/10/19 108/82  03/27/19 100/68  10/11/18 116/72    Your Weight History Wt Readings from Last 3 Encounters:  04/10/19 155 lb (70.3 kg)  03/27/19 157 lb 9.6 oz (71.5 kg)  10/11/18 164 lb 12.8 oz (74.8 kg)    Body mass index is 27.46 kg/m.  BMI Classes Classification BMI Category (kg/m2)  Underweight < 18.5  Normal Weight 18.5-24.9  Overweight  25.0-29.9  Obese Class I 30.0-34.9  Obese Class II 35.0-39.9  Obese Class III  > or  = 40.0      Your last A1C     Every 3-6 months if you have diabetes No results found for: HGBA1C  Your last Cholesterol   Every 1-5 years No results found for: CHOL, HDL, LDLCALC, LDLDIRECT  Your last Blood Tests -  Once a year if you take medications    Component Value Date/Time   K 3.4 (L) 02/18/2016 0406   CREATININE 0.74 02/18/2016 0406   GLUCOSE 97 02/18/2016 0406    To Keep You Healthy Your are due for the following Health Maintenance Items:  Health Maintenance Due  Topic Date Due  . HIV Screening  04/15/2001  . TETANUS/TDAP  04/15/2005  . PAP SMEAR-Modifier  09/12/2013  .  INFLUENZA VACCINE  03/18/2019    Please schedule an appointment with your healthcare provider for any questions or concerns regarding your health or any of the items above.    Work and shoulder pain When you think of shoulder pain at work, you may think of physically demanding jobs like [employer] representative[employer]. Indeed, lifting heavy objects repeatedly overhead may cause shoulder pain or injury over time. But office work, too, can also be the culprit.  Most of us spend hours each day glued to our computer screens, for both work and play. The problem is, sitting or standing incorrectly and for hours on end may be causing your shoulder issues.  Here are some pointers that may help reduce or prevent your shoulder pain at work:  Reducing or preventing shoulder pain in the workplace Sit or stand in a natural position. Whether you sit or stand in front of your computer, your body should be in a natural and relaxed position. This means keeping your desk even with or below your elbows-you don't want to shrug your shoulders all day because your desk is too high.  Maintain good posture. Be conscious of your body position while working at your computer, and aim to have good posture. If you're sitting, try to remember these steps:  Place your feet flat on the floor Push your hips toward the back  of your chair Keep your elbows close toward your body, and your forearms, wrists, and hands in straight out in front of you Relax your shoulders, but don't round them If you're standing, stand tall and don't slouch. Standing on a softer surface, like an anti-fatigue mat, and wearing tennis shoes instead of stilettos can help you maintain that upright position.  Change your position. Some people to stand for a few hours, then sit for an hour or two, followed by more standing. The desire for variance makes sense because fatigue eventually settles in whether you are sitting or standing. As a result, your posture can  suffer-rounding your shoulders or leaning in an awkward position-which may lead to discomfort.  Take breaks. Taking breaks can not only help you physically, but mentally as well. Try detaching yourself from your computer screen for 5 or 10 minutes a few times each work day and take a walk around your office building or do a few stretches.  Here are a few stretches to try:  Raise your shoulders up toward your ears, hold this position for several seconds, and lower your shoulders back down.1 Bring your arms in front of your body and interlock your fingers. Then, extend your arms out in front of your body and rotate your wrists so that the tops of your fingers are facing your body and hold this position for 10 to 15 seconds.1 Taking breaks several times throughout your day can give you the physical and mental boost you need to have a productive and comfortable work day.  Consider your habits outside of work. What you're doing when you're not in the office could be affecting your shoulders, too. Maybe you've just started a new exercise program or increased the intensity of your current workout. This may leave you with lingering muscle aches.  Perhaps you've got the opposite problem: you sit all day at work and then you come home and sit more, say in front of your television or computer. In this situation, some exercise may help. For example, taking a yoga class may help build up your muscles in your core, which can help your posture.2 Speak with your doctor before starting a new exercise regime.  Sometimes listening to your body and making a few simple tweaks are all you need to avoid shoulder pain at work.

## 2019-04-11 ENCOUNTER — Telehealth: Payer: Self-pay | Admitting: Licensed Clinical Social Worker

## 2019-04-11 ENCOUNTER — Ambulatory Visit: Payer: PRIVATE HEALTH INSURANCE

## 2019-04-11 NOTE — Telephone Encounter (Signed)
Care Coordination Social Work Note  04/11/2019 Name: Annette Ellison MRN: [id number] DOB: May 26, 1986  Annette Ellison is a 33 y.o. year old female who sees Maudie Mercury, Charlyne Quale, MD for primary care.  F/U phone appoint with Annette Ellison.  Patient unable to take call today due to conflict with another meeting.  Appointment rescheduled.   Casimer Lanius, LCSW Clinical Social Worker [place] [facility] [place]   [phone] 11:44 AM

## 2019-04-13 ENCOUNTER — Telehealth: Payer: Self-pay | Admitting: Licensed Clinical Social Worker

## 2019-04-13 ENCOUNTER — Other Ambulatory Visit: Payer: Self-pay

## 2019-04-13 ENCOUNTER — Ambulatory Visit: Payer: PRIVATE HEALTH INSURANCE

## 2019-04-13 NOTE — Telephone Encounter (Signed)
Unsuccessful Phone Outreach Note  04/13/2019 Name: Annette Ellison MRN: [id number] DOB: 10/28/1985  Referred by: Wilber Oliphant, MD Reason for referral : Care Coordination (2nd F/U call) ;Annette Ellison is a 33 y.o. year old female who sees Maudie Mercury, Charlyne Quale, MD for primary care. 2nd unsuccessful telephone outreach appointment attempt for Annette Ellison . Called patient twice today within the scheduled time. Mailbox is full and unable to leave a message. Unable to leave A HIPPA compliant phone message   Plan: LCSW will discontinue outreach calls but will gladly be available at any time to provide services to Annette Ellison. If she calls to reschedule the appointment.  Dr. Maudie Mercury has been informed of this plan.  Casimer Lanius, LCSW Clinical Social Worker [place] [facility] [place]   [phone] 11:37 AM

## 2019-04-14 ENCOUNTER — Encounter: Payer: Self-pay | Admitting: Family Medicine

## 2019-04-14 DIAGNOSIS — Z0000 Encounter for general adult medical examination without abnormal findings: Secondary | ICD-10-CM | POA: Insufficient documentation

## 2019-04-14 NOTE — Assessment & Plan Note (Signed)
Patient was taking sertraline and reports that she is having some headaches with medication.  I would like to readdress this with the patient and she is agreeable to follow-up in a couple of weeks.

## 2019-04-14 NOTE — Assessment & Plan Note (Signed)
Health Maintenance reviewed   Health Maintenance Due  Topic Date Due  . HIV Screening  04/15/2001  . TETANUS/TDAP  04/15/2005  . PAP SMEAR-Modifier  09/12/2013  . INFLUENZA VACCINE  03/18/2019

## 2019-04-14 NOTE — Progress Notes (Signed)
Procedure Note:  PRE-OP DIAGNOSIS: Shoulder Pain POST-OP DIAGNOSIS: Same  PROCEDURE: joint injection Performing Physician: Zettie Cooley, MD  Supervising Physician (if applicable): Dr.    Dose:        6mL Lidocaine   + 40mg   Depo-Medrol   Procedure: The area was prepped in the usual sterile manner. The needle was inserted into the subacromial bursa from a posterolateral later acromion and the steroid was injected.  There were no complications during this procedure.   Followup: The patient tolerated the procedure well without complications.  Standard post-procedure care is explained and return precautions are given.

## 2019-04-14 NOTE — Assessment & Plan Note (Signed)
Patient with mild reversal of normal lordosis of cervical cervical spine.  Otherwise no other abnormalities on x-rays of the spine.  This musculoskeletal pain has been chronic and was previously seen by Dr. Lysle Rubens in February.  At that time she was experiencing it for 4 months.  It is likely that her pinpoint shoulder pain is associated with her occupation and her posture at work daily.  I have encouraged patient to change positions often and perform stretches to help with shoulder pain. Today performed subacromial injection with depomedrol:lidocaine 3:1. Patient to follow up if continued pain. If this doesn't improve with injection, we can rule out subacromial process associated with patient's pain.  - continue to monitor - PT exercises - OTC NSAIDs PRN pain

## 2019-04-19 ENCOUNTER — Telehealth: Payer: Self-pay | Admitting: *Deleted

## 2019-04-19 ENCOUNTER — Other Ambulatory Visit: Payer: Self-pay

## 2019-04-19 ENCOUNTER — Telehealth: Payer: PRIVATE HEALTH INSURANCE | Admitting: Family Medicine

## 2019-04-19 NOTE — Telephone Encounter (Signed)
Tried to contact pt 4 times for her virtual visit today at 4:00pm.  Went straight to VM and it was full so unable to LM.  If pt calls back she will need to reschedule. April Zimmerman Rumple, CMA

## 2019-04-20 ENCOUNTER — Telehealth (INDEPENDENT_AMBULATORY_CARE_PROVIDER_SITE_OTHER): Payer: PRIVATE HEALTH INSURANCE | Admitting: Family Medicine

## 2019-04-20 DIAGNOSIS — F329 Major depressive disorder, single episode, unspecified: Secondary | ICD-10-CM

## 2019-04-20 DIAGNOSIS — M62830 Muscle spasm of back: Secondary | ICD-10-CM | POA: Diagnosis not present

## 2019-04-20 DIAGNOSIS — F419 Anxiety disorder, unspecified: Secondary | ICD-10-CM

## 2019-04-20 NOTE — Assessment & Plan Note (Signed)
Gad 7 significantly improved.  Now a total of 8+ not difficult at all.  Patient states that she feels like the dose of sertraline is effective for her and does not want to increase dose at this time.  States that she and her husband have both noticed a significant improvement and states that she is doing much better with her mood and anxiety.  Patient to follow-up in 1 month with PCP Dr. Maudie Mercury to ensure that she continues to improve on this dose and does not need dose increase.  Strict return precautions given.  Follow-up in 1 month.

## 2019-04-20 NOTE — Progress Notes (Signed)
[place] Telemedicine Visit I connected with  Annette Ellison on 04/20/19 by a video enabled telemedicine application and verified that I am speaking with the correct person using two identifiers.   I discussed the limitations of evaluation and management by telemedicine. The patient expressed understanding and agreed to proceed.  Patient consented to have virtual visit. Method of visit: Telephone  Encounter participants: Patient: Annette Ellison - located at Home Provider: Caroline More - located at [facility] Others (if applicable): None  Chief Complaint: f/u  HPI: Patient reporting that she is following up from previous appointment with PCP Dr. Maudie Mercury  Shoulder Patient had shoulder injection on 8/24, reports still some tenderness but improved. Pain is now a 5/10 but is more manageable. Prior to injection pain was 10/10. Patient used to wake up at night with pain, now is sleeping better. Is doing shoulder exercises.    Anxiety  Patient was started on sertraline. Patient reports improvement in her anxiety since starting. Reports she was having severe headaches, but reports no longer having headaches.  Reports that both her and her husband have noticed a significant change in her mood.  Reports that she enjoys the benefits that she has from taking her medication.   GAD 7 : Generalized Anxiety Score 04/20/2019  Nervous, Anxious, on Edge 1  Control/stop worrying 1  Worry too much - different things 1  Trouble relaxing 2  Restless 2  Easily annoyed or irritable 1  Afraid - awful might happen 0  Total GAD 7 Score 8  Anxiety Difficulty Not difficult at all      ROS: per HPI  Pertinent PMHx: anxiety/depression, scoliosis, spasm of thoracic back muscle  Exam:  Respiratory: speaking full sentences, no increased WOB  Assessment/Plan:  Spasm of thoracic back muscle Patient with significant improvement after having injection.  50% improvement in function.  Advised that  patient continue to use NSAIDs as needed as well as continue shoulder exercises.  Patient to follow-up at the end of the month with her PCP Dr. Tyler Pita to do a physical exam to evaluate for shoulder improvement.  Strict return precautions given.  Follow-up in 1 month.  Anxiety and depression Gad 7 significantly improved.  Now a total of 8+ not difficult at all.  Patient states that she feels like the dose of sertraline is effective for her and does not want to increase dose at this time.  States that she and her husband have both noticed a significant improvement and states that she is doing much better with her mood and anxiety.  Patient to follow-up in 1 month with PCP Dr. Maudie Mercury to ensure that she continues to improve on this dose and does not need dose increase.  Strict return precautions given.  Follow-up in 1 month.    Time spent during visit with patient: 15 minutes

## 2019-04-20 NOTE — Assessment & Plan Note (Signed)
Patient with significant improvement after having injection.  50% improvement in function.  Advised that patient continue to use NSAIDs as needed as well as continue shoulder exercises.  Patient to follow-up at the end of the month with her PCP Dr. Tyler Pita to do a physical exam to evaluate for shoulder improvement.  Strict return precautions given.  Follow-up in 1 month.

## 2019-05-17 ENCOUNTER — Ambulatory Visit: Payer: PRIVATE HEALTH INSURANCE | Admitting: Family Medicine

## 2019-05-17 ENCOUNTER — Other Ambulatory Visit: Payer: Self-pay

## 2019-05-29 ENCOUNTER — Other Ambulatory Visit: Payer: Self-pay | Admitting: Family Medicine

## 2019-05-29 ENCOUNTER — Telehealth (INDEPENDENT_AMBULATORY_CARE_PROVIDER_SITE_OTHER): Payer: PRIVATE HEALTH INSURANCE | Admitting: Family Medicine

## 2019-05-29 ENCOUNTER — Encounter: Payer: Self-pay | Admitting: Family Medicine

## 2019-05-29 ENCOUNTER — Other Ambulatory Visit: Payer: Self-pay

## 2019-05-29 DIAGNOSIS — F329 Major depressive disorder, single episode, unspecified: Secondary | ICD-10-CM

## 2019-05-29 DIAGNOSIS — M62830 Muscle spasm of back: Secondary | ICD-10-CM | POA: Diagnosis not present

## 2019-05-29 DIAGNOSIS — F419 Anxiety disorder, unspecified: Secondary | ICD-10-CM

## 2019-05-29 DIAGNOSIS — M25511 Pain in right shoulder: Secondary | ICD-10-CM

## 2019-05-29 DIAGNOSIS — F411 Generalized anxiety disorder: Secondary | ICD-10-CM | POA: Diagnosis not present

## 2019-05-29 NOTE — Progress Notes (Signed)
[place] Telemedicine Visit  Patient consented to have virtual visit. Method of visit: Telephone  Encounter participants: Patient: Annette Ellison - located at home Provider: Wilber Oliphant - located at [facility] Others (if applicable): None  Chief Complaint: Follow-up shoulder pain and follow-up anxiety   HPI: Anxiety: Patient started on Zoloft 25 mg with Dr. Sandi Carne on March 27, 2019.  She reports that she is doing very well on this medication and feels her anxiety and depression have improved greatly.  She does report that she is having some insomnia and is not sure if this is due to the medication.  She was told by Dr. Sandi Carne to take this medication nightly as it may have an impact on another medication that she is taking.  Patient denies any headaches, nausea, vomiting, AVH.  Shoulder Pain: Additionally, patient reports that she has continued shoulder pain.  She reports that some parts of her shoulder feel better but some continued to hurt.  It is difficult for her to describe to me over the phone what parts of her shoulder hurting and when.  She is agreeable to coming in for an office visit.   ROS: per HPI  Pertinent PMHx: Anxiety and depression  Exam:  No acute distress.  Normal speech and respirations.  Assessment/Plan:  Shoulder pain Patient continues to have problems with her right shoulder.  This is difficult to assess over the phone and patient is agreeable to coming in for follow-up visit in the office.   Generalized anxiety disorder Patient continues to do well and would like to stay on the medication.  She is not having any bad side effects.  We will continue this medication as currently dosed.  No changes at this time.    Time spent during visit with patient: 10 minutes

## 2019-06-02 DIAGNOSIS — M25519 Pain in unspecified shoulder: Secondary | ICD-10-CM | POA: Insufficient documentation

## 2019-06-02 NOTE — Assessment & Plan Note (Signed)
Patient continues to do well and would like to stay on the medication.  She is not having any bad side effects.  We will continue this medication as currently dosed.  No changes at this time.

## 2019-06-02 NOTE — Assessment & Plan Note (Signed)
Patient continues to have problems with her right shoulder.  This is difficult to assess over the phone and patient is agreeable to coming in for follow-up visit in the office.

## 2019-06-05 ENCOUNTER — Ambulatory Visit: Payer: PRIVATE HEALTH INSURANCE | Admitting: Family Medicine

## 2019-06-26 ENCOUNTER — Other Ambulatory Visit: Payer: Self-pay

## 2019-06-26 DIAGNOSIS — Z20822 Contact with and (suspected) exposure to covid-19: Secondary | ICD-10-CM

## 2019-06-27 ENCOUNTER — Ambulatory Visit: Payer: PRIVATE HEALTH INSURANCE | Admitting: Family Medicine

## 2019-06-27 LAB — NOVEL CORONAVIRUS, NAA: SARS-CoV-2, NAA: NOT DETECTED

## 2019-07-11 ENCOUNTER — Other Ambulatory Visit: Payer: Self-pay

## 2019-07-11 ENCOUNTER — Ambulatory Visit: Payer: PRIVATE HEALTH INSURANCE | Admitting: Family Medicine

## 2019-07-11 ENCOUNTER — Encounter: Payer: Self-pay | Admitting: Family Medicine

## 2019-07-11 VITALS — BP 108/64 | HR 84

## 2019-07-11 DIAGNOSIS — M62830 Muscle spasm of back: Secondary | ICD-10-CM

## 2019-07-11 DIAGNOSIS — M25519 Pain in unspecified shoulder: Secondary | ICD-10-CM | POA: Diagnosis not present

## 2019-07-11 MED ORDER — METHYLPREDNISOLONE ACETATE 40 MG/ML IJ SUSP
40.0000 mg | Freq: Once | INTRAMUSCULAR | Status: AC
  Administered 2019-07-11: 40 mg via INTRAMUSCULAR

## 2019-07-15 ENCOUNTER — Encounter: Payer: Self-pay | Admitting: Family Medicine

## 2019-07-15 NOTE — Assessment & Plan Note (Signed)
Patient continues to experience chronic muscle pain just medial to her scapula at the inferior aspect.  No motor or strength deficits noted on PE. She reports some relief with tenacity in.  Upon history and physical exam, symptoms most consistent with trigger point given specific localization.  Patient reports that she has tried physical therapy in the past and that it is worked on other [facility] places.  Explained to patient that there is something likely causing this trigger point and her pain will not be completely resolved until it is addressed.   Also placed trigger point injection today which patient reported immediate relief with.  -Further referral to sports medicine to help identify cause of pain.

## 2019-07-15 NOTE — Progress Notes (Signed)
    Subjective  CC: Shoulder Pain (bilateral but left is worse than the right)  Annette Ellison is a 33 y.o. female who presents today with the following problems:  Upper back pain Patient reports that she has ongoing upper back pain medial to her shoulder blade.  She previously told about this and was instructed to follow-up a few months ago.  She reports that the pain is the same as previously described.  The pain is located just above her bra line just medial to her shoulder blade.  She reports that it is constantly aching but does worsen with some movements.  She also has chronic shoulder girdle pain that she has previously received injections were.  She reports that this is a different pain from her shoulder.  She reports taking muscle relaxers as previously prescribed are helpful.  She reports that she is not getting any sleep at night as the pain does wake her up.  She denies any recent trauma or new exercises.  Pertinent PM/FHx: Shoulder pain, idiopathic scoliosis, muscle spasm Social Hx: Never smoker.  ROS: Pertinent ROS included in HPI. Objective  Physical Exam:  BP 108/64   Pulse 84   SpO2 98%  General: Well-appearing female in no distress. Back: No tenderness along palpation of spine.  Tenderness to palpation located at just above the strap and medial to scapula at the inferior aspect.  Local lysed tenderness to palpation that makes patient grimace.  Site of palpation is very specific and about half dollar in size.  She has pain in the surrounding areas but reports that the pain is not as bad as the specific location.   Trigger Point Injection procedure note Description: Trigger Point Injection.  OPERATION: Trigger Point Injection. ANESTHESIA: Local  COMPLICATIONS: None. DESCRIPTION OF PROCEDURE: The procedure risks, hazards and alternatives were discussed with the patient and a proper consent was obtained. The area over the myofascial spasm was prepped with alcohol utilizing  sterile technique. After isolating it between two palpating fingertips a 25-gauge 5" needle was placed in the center of the myofascial spasms and a negative aspiration was performed. Then 1 cc of depo-medrol and 2 cc of bupivacaine was injected into trigger point. The patient tolerated the procedure well without any apparent difficulties or complications. They were feeling relief by the time the block had set.   Assessment & Plan    Problem List Items Addressed This Visit    Shoulder pain - Primary   Spasm of thoracic back muscle    Patient continues to experience chronic muscle pain just medial to her scapula at the inferior aspect.  No motor or strength deficits noted on PE. She reports some relief with tenacity in.  Upon history and physical exam, symptoms most consistent with trigger point given specific localization.  Patient reports that she has tried physical therapy in the past and that it is worked on other [facility] places.  Explained to patient that there is something likely causing this trigger point and her pain will not be completely resolved until it is addressed.   Also placed trigger point injection today which patient reported immediate relief with.  -Further referral to sports medicine to help identify cause of pain.          Wilber Oliphant, M.D.  3:06 PM 07/15/2019

## 2019-08-02 ENCOUNTER — Other Ambulatory Visit: Payer: Self-pay | Admitting: *Deleted

## 2019-08-02 DIAGNOSIS — F32A Depression, unspecified: Secondary | ICD-10-CM

## 2019-08-02 DIAGNOSIS — F329 Major depressive disorder, single episode, unspecified: Secondary | ICD-10-CM

## 2019-08-03 MED ORDER — SERTRALINE HCL 25 MG PO TABS: 25.0000 mg | ORAL_TABLET | Freq: Every day | ORAL | 11 refills | Status: DC

## 2019-08-16 ENCOUNTER — Other Ambulatory Visit: Payer: Self-pay | Admitting: *Deleted

## 2019-08-16 DIAGNOSIS — N6452 Nipple discharge: Secondary | ICD-10-CM

## 2019-09-05 ENCOUNTER — Other Ambulatory Visit: Payer: Self-pay

## 2019-09-05 ENCOUNTER — Ambulatory Visit
Admission: RE | Admit: 2019-09-05 | Discharge: 2019-09-05 | Disposition: A | Discharge status: Home / Self Care | Payer: PRIVATE HEALTH INSURANCE | Source: Ambulatory Visit | Attending: *Deleted | Admitting: *Deleted

## 2019-09-05 ENCOUNTER — Other Ambulatory Visit: Payer: Self-pay | Admitting: *Deleted

## 2019-09-05 DIAGNOSIS — N6452 Nipple discharge: Secondary | ICD-10-CM

## 2019-09-05 IMAGING — US US BREAST*R* LIMITED INC AXILLA
1 series · 3 of 3 positions shown · non-contrast
Comparison: None

CLINICAL DATA: Patient presents for evaluation of intermittent
bilateral spontaneous nipple discharge. Patient reports that the
discharge occurs every 4-6 weeks and is predominately white in
color.

EXAM:
DIGITAL DIAGNOSTIC BILATERAL MAMMOGRAM WITH CAD AND TOMO
ULTRASOUND LEFT BREAST

[Series 1: us breast*right* limited inc axilla · 0.07mm/px · 3 of 3 slices shown]
[im 1/3]
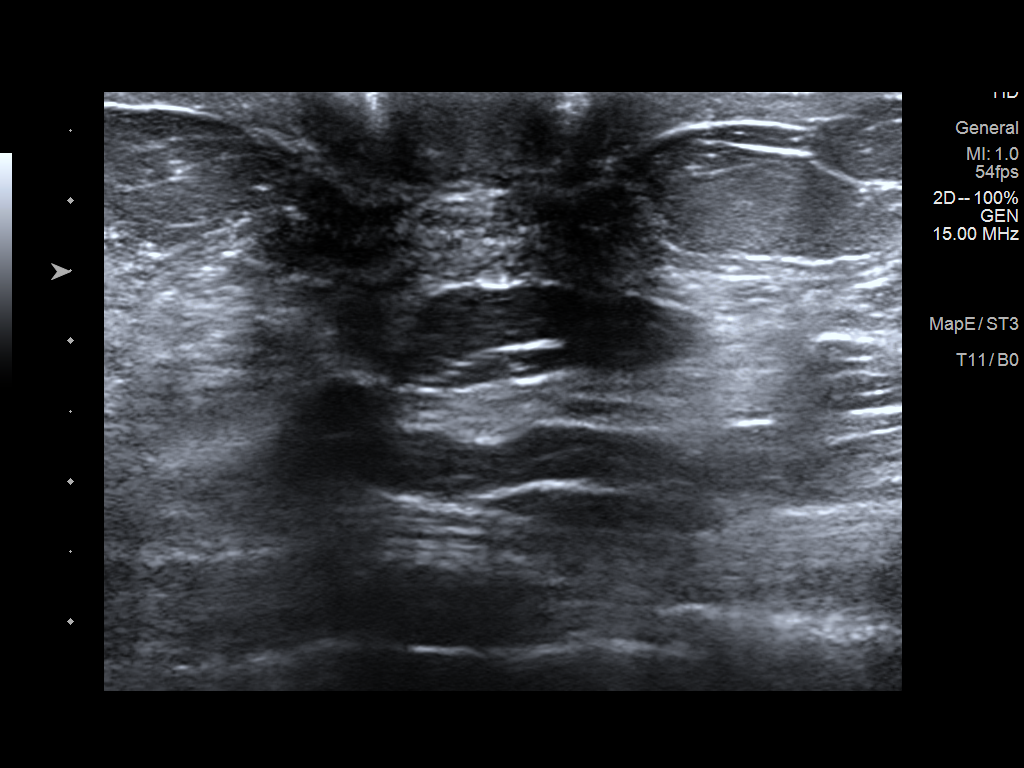
[im 2/3]
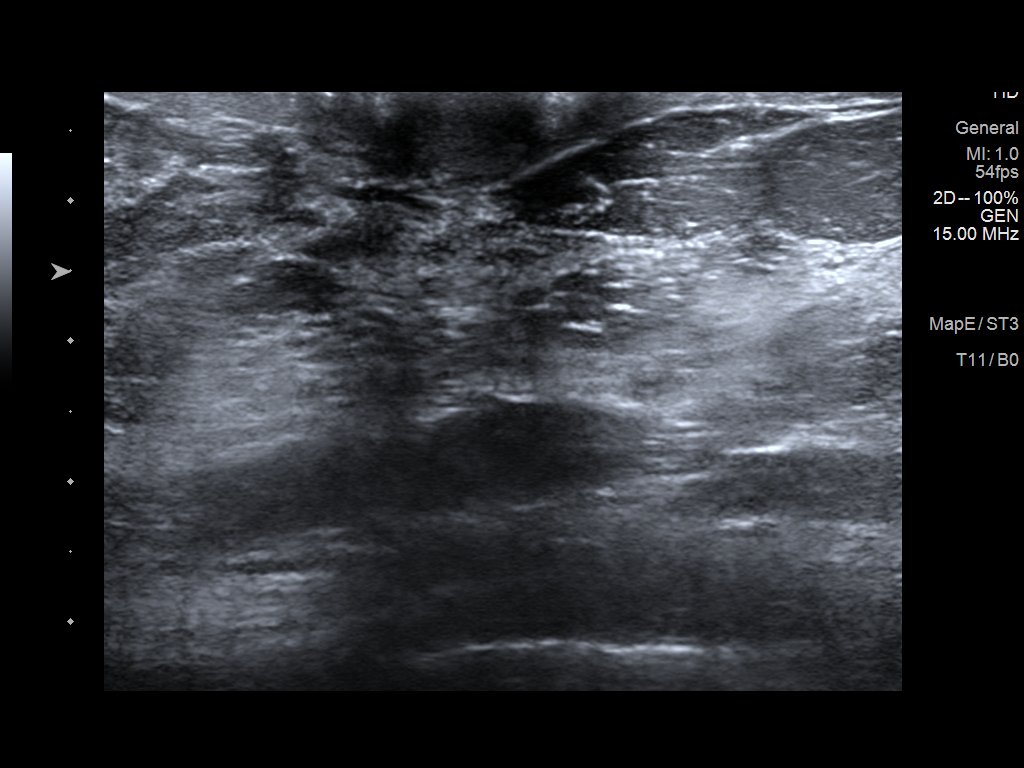
[im 3/3]
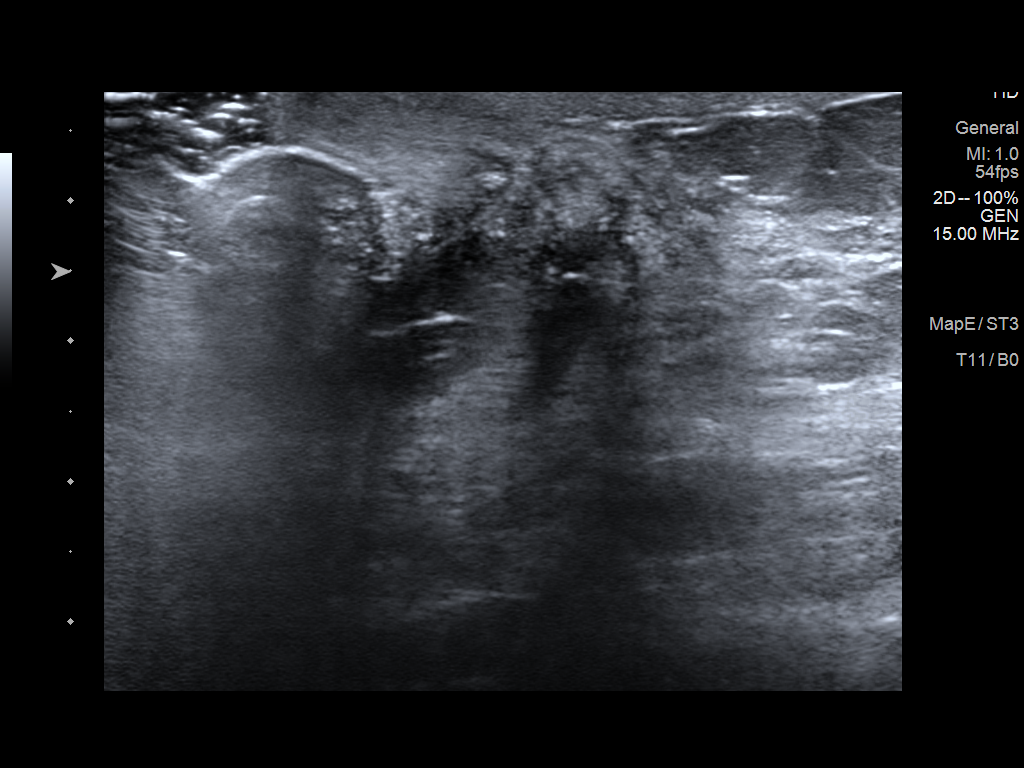

[3 of 3 positions shown; findings below may reference images not displayed]

ACR Breast Density Category c: The breast tissue is heterogeneously
dense, which may obscure small masses.
FINDINGS: Within the retroareolar left breast there is a small oval
circumscribed mass. No additional concerning masses, calcifications
or distortion identified within either breast.

Mammographic images were processed with CAD.

Targeted ultrasound is performed, showing no discrete dilated duct
or intraductal mass within the retroareolar right or left breast.

Within the left breast 3:30 o'clock retroareolar location there is a
7 x 5 x 2 mm mildly complicated cyst. This is felt to correspond
with mammographic abnormality.
IMPRESSION: Likely physiologic discharge given the bilateral nature and reported
whitish color. Given the spontaneous nature of the discharge
bilateral breast MRI is recommended.

No mammographic evidence for malignancy.

RECOMMENDATION:
Bilateral breast MRI for further evaluation of reported nipple
discharge.

Continued clinical evaluation for bilateral reported nipple
discharge.

Screening mammogram at age 40 unless there are persistent or
intervening clinical concerns. (Code:[L2])

I have discussed the findings and recommendations with the patient.
If applicable, a reminder letter will be sent to the patient
regarding the next appointment.

BI-RADS CATEGORY  2: Benign.

## 2019-09-05 IMAGING — US US BREAST*L* LIMITED INC AXILLA
1 series · 8 of 8 positions shown · non-contrast
Comparison: None

CLINICAL DATA: Patient presents for evaluation of intermittent
bilateral spontaneous nipple discharge. Patient reports that the
discharge occurs every 4-6 weeks and is predominately white in
color.

EXAM:
DIGITAL DIAGNOSTIC BILATERAL MAMMOGRAM WITH CAD AND TOMO
ULTRASOUND LEFT BREAST

[Series 1: us breast*left* limited inc axilla · 0.06mm/px · 8 of 8 slices shown]
[im 1/8]
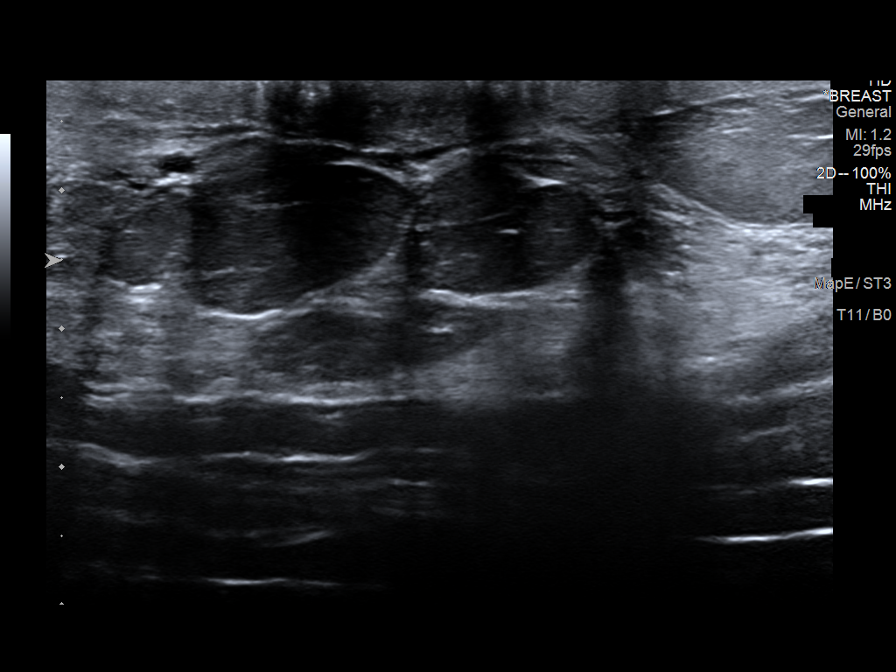
[im 2/8]
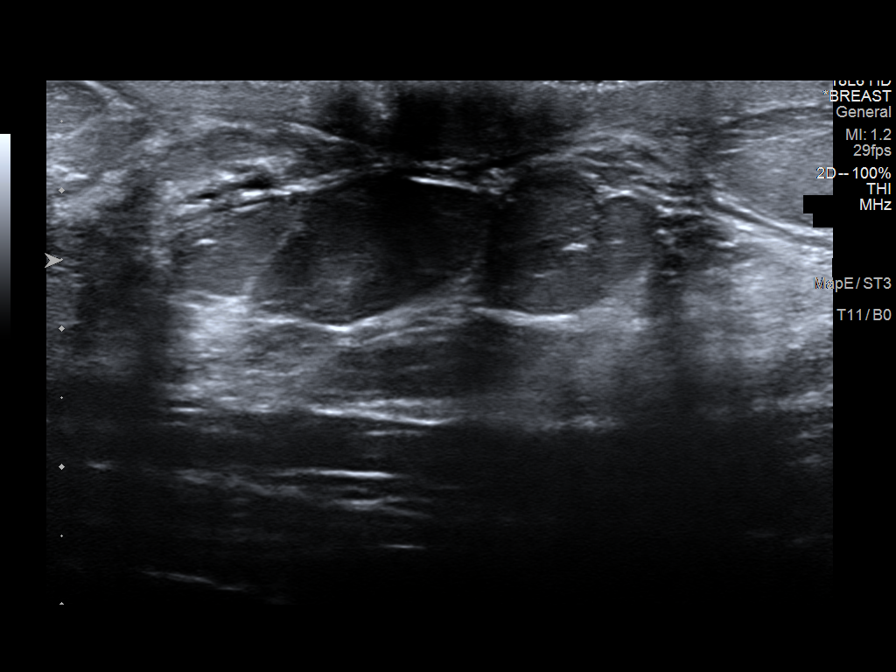
[im 3/8]
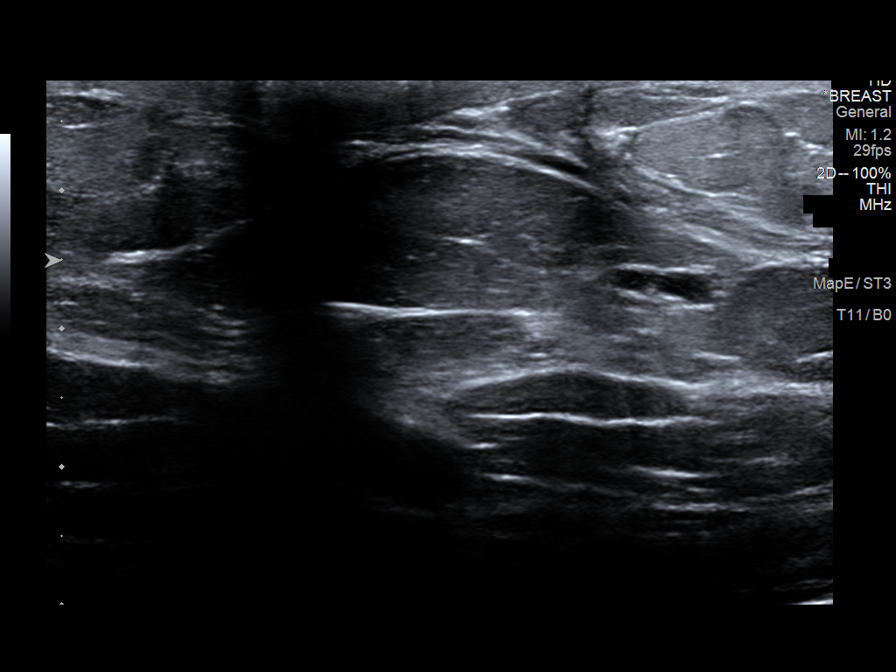
[im 4/8]
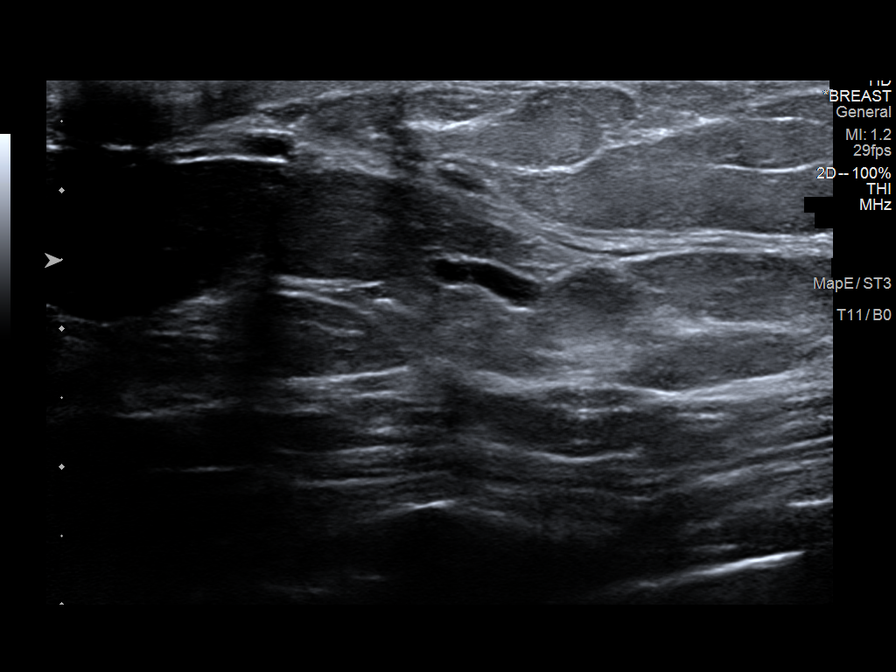
[im 5/8]
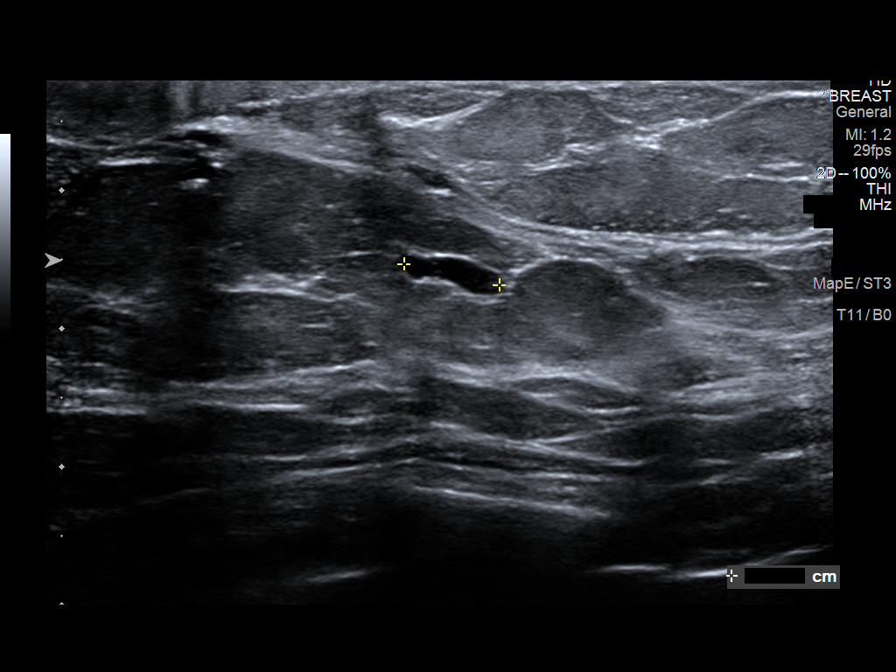
[im 6/8]
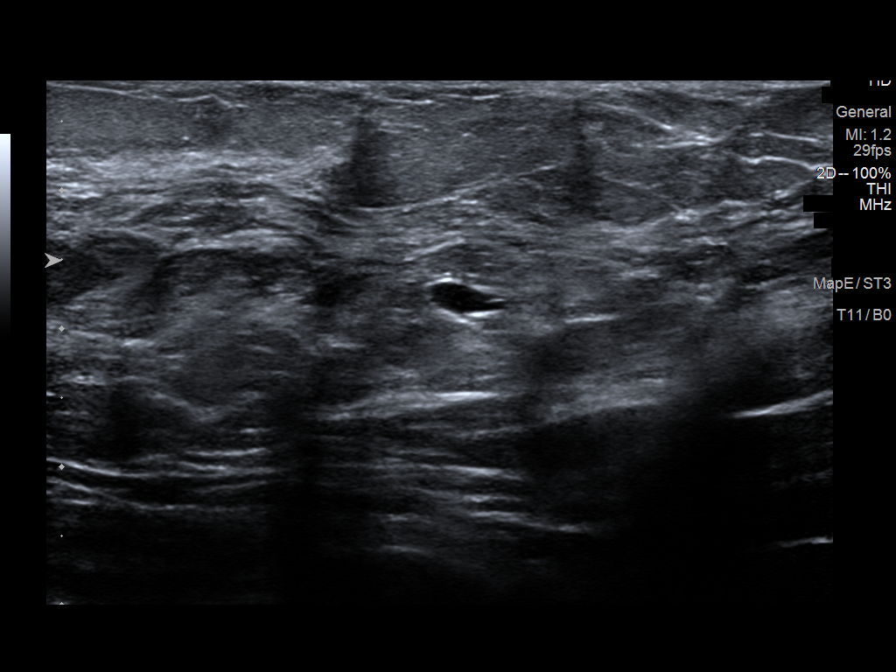
[im 7/8]
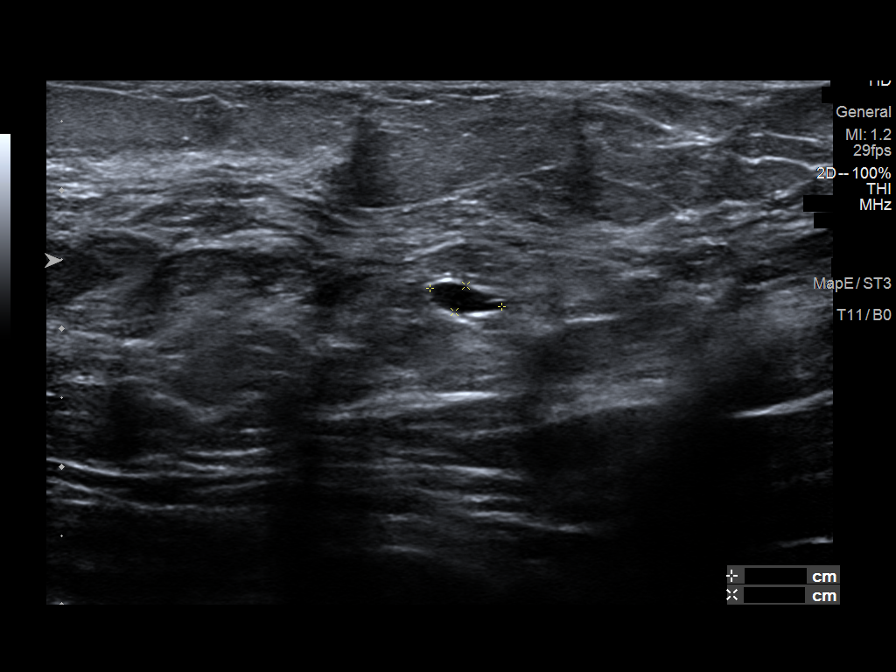
[im 8/8]
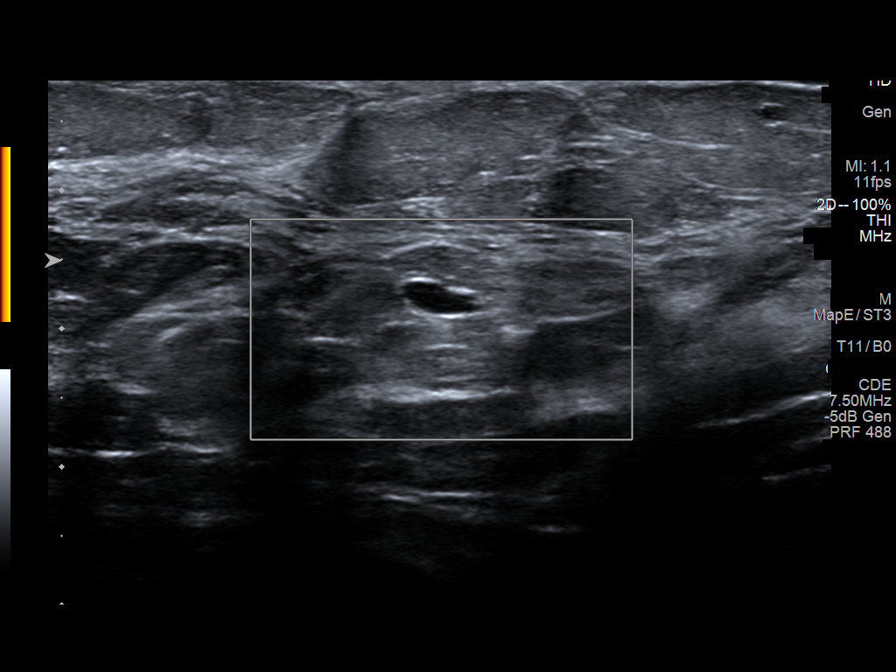

[8 of 8 positions shown; findings below may reference images not displayed]

ACR Breast Density Category c: The breast tissue is heterogeneously
dense, which may obscure small masses.
FINDINGS: Within the retroareolar left breast there is a small oval
circumscribed mass. No additional concerning masses, calcifications
or distortion identified within either breast.

Mammographic images were processed with CAD.

Targeted ultrasound is performed, showing no discrete dilated duct
or intraductal mass within the retroareolar right or left breast.

Within the left breast 3:30 o'clock retroareolar location there is a
7 x 5 x 2 mm mildly complicated cyst. This is felt to correspond
with mammographic abnormality.
IMPRESSION: Likely physiologic discharge given the bilateral nature and reported
whitish color. Given the spontaneous nature of the discharge
bilateral breast MRI is recommended.

No mammographic evidence for malignancy.

RECOMMENDATION:
Bilateral breast MRI for further evaluation of reported nipple
discharge.

Continued clinical evaluation for bilateral reported nipple
discharge.

Screening mammogram at age 40 unless there are persistent or
intervening clinical concerns. (Code:[L2])

I have discussed the findings and recommendations with the patient.
If applicable, a reminder letter will be sent to the patient
regarding the next appointment.

BI-RADS CATEGORY  2: Benign.

## 2019-09-05 IMAGING — MG DIGITAL DIAGNOSTIC BILAT W/ TOMO W/ CAD
6 of 12 series · 6 of 36 positions shown · non-contrast
Comparison: None

CLINICAL DATA: Patient presents for evaluation of intermittent
bilateral spontaneous nipple discharge. Patient reports that the
discharge occurs every 4-6 weeks and is predominately white in
color.

EXAM:
DIGITAL DIAGNOSTIC BILATERAL MAMMOGRAM WITH CAD AND TOMO
ULTRASOUND LEFT BREAST

[R CC synth-2D]
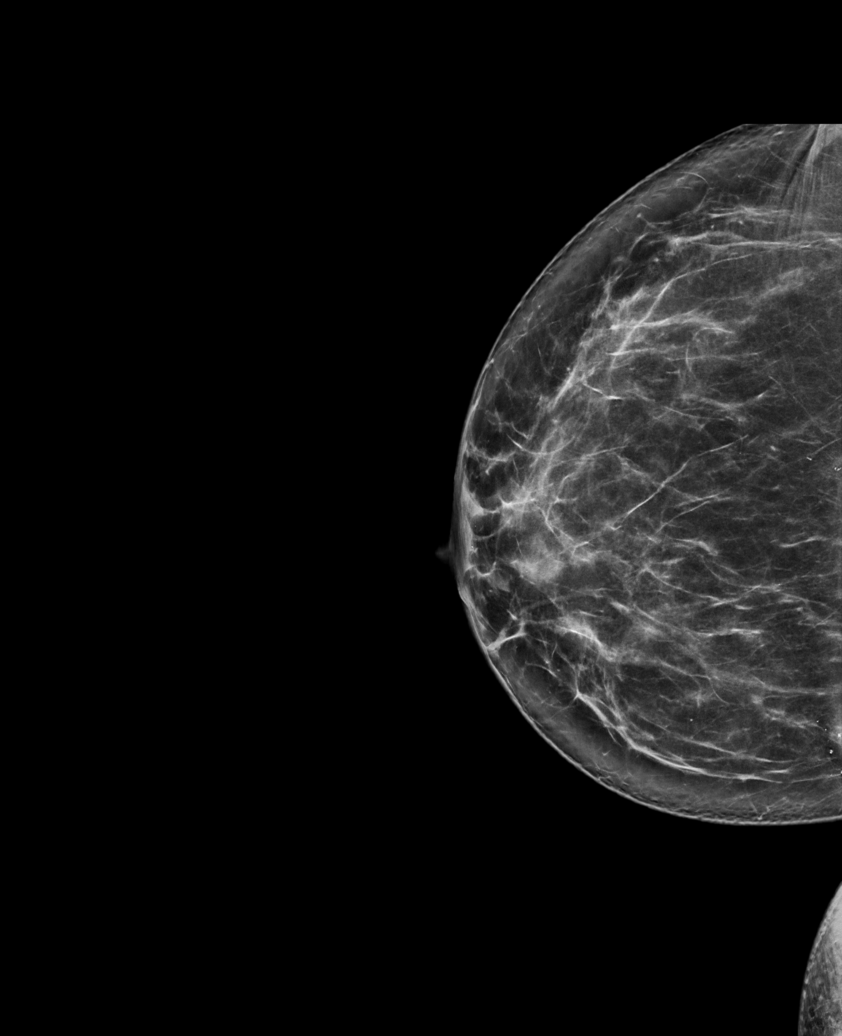

[L TAN synth-2D]
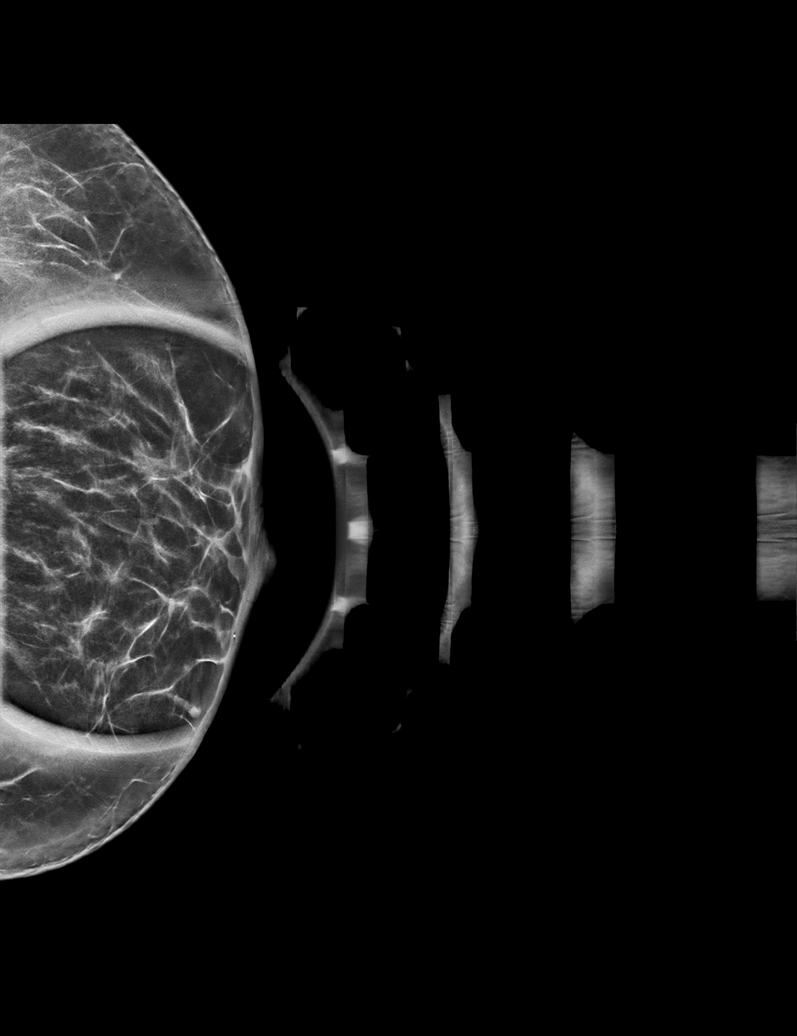

[R TAN synth-2D]
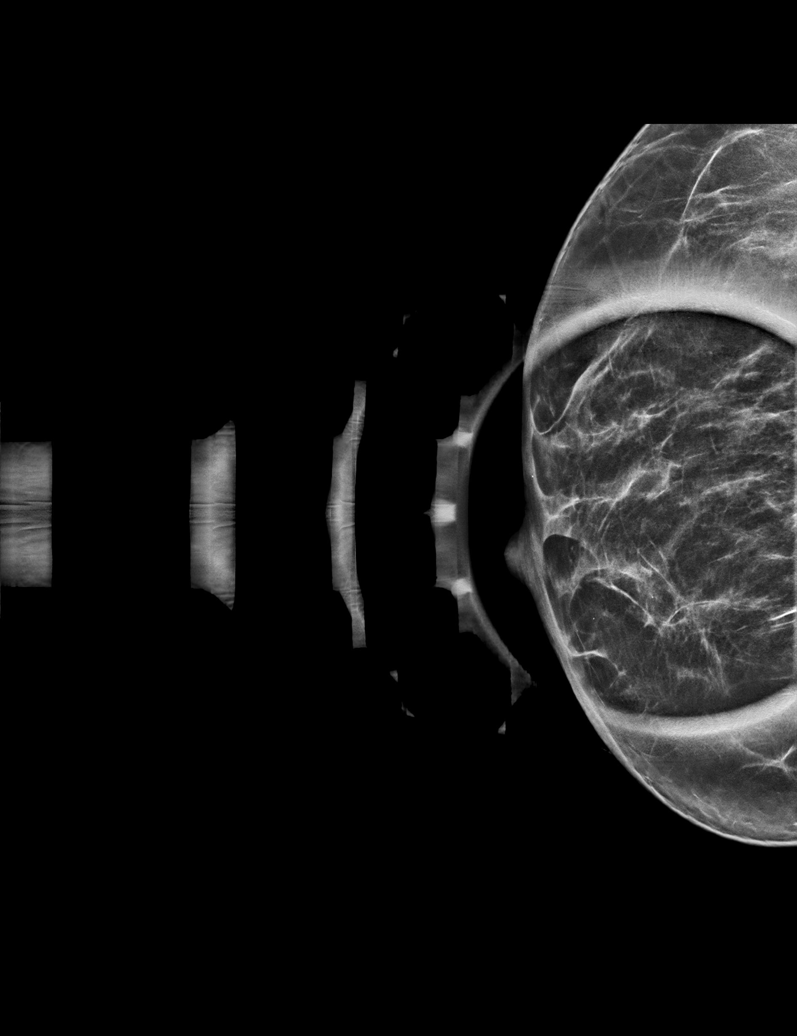

[L MLO synth-2D]
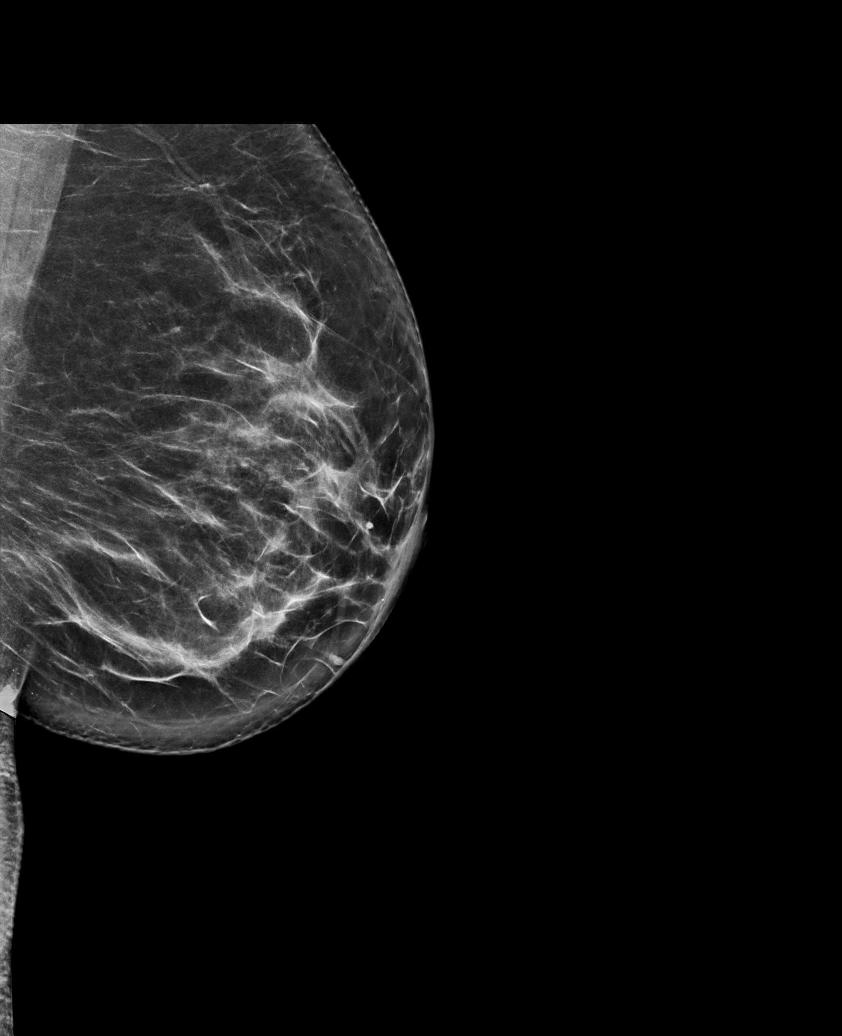

[R MLO synth-2D]
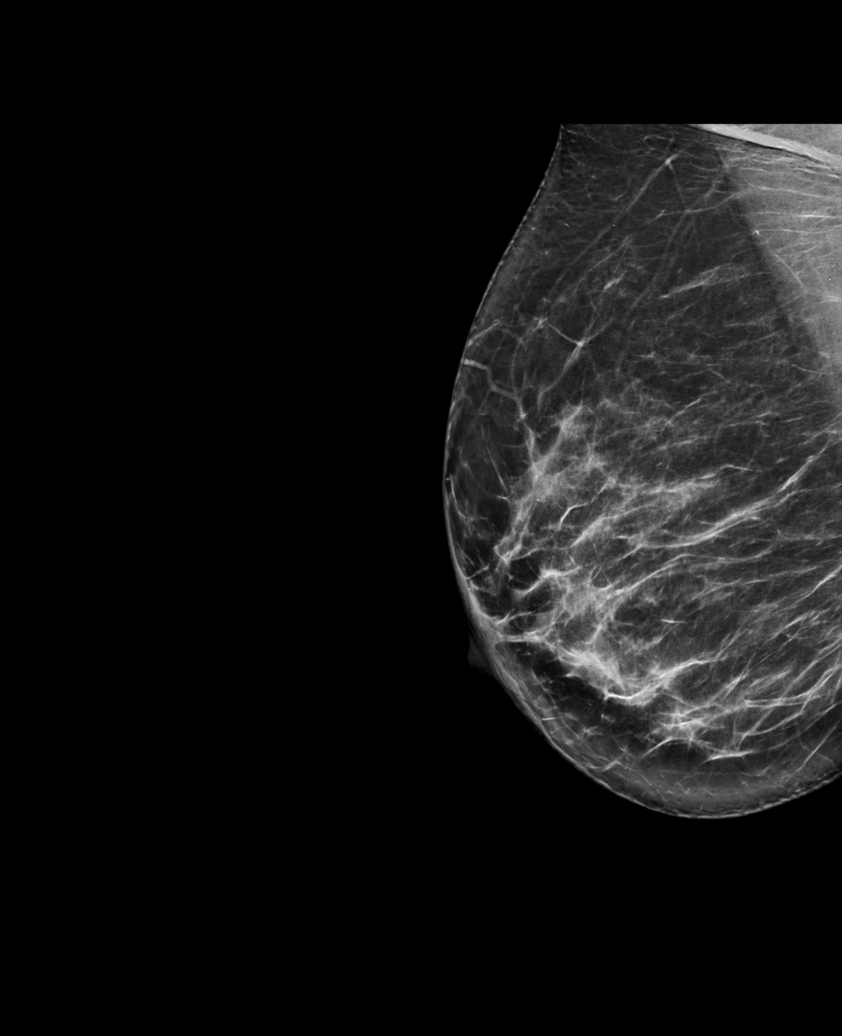

[L CC synth-2D]
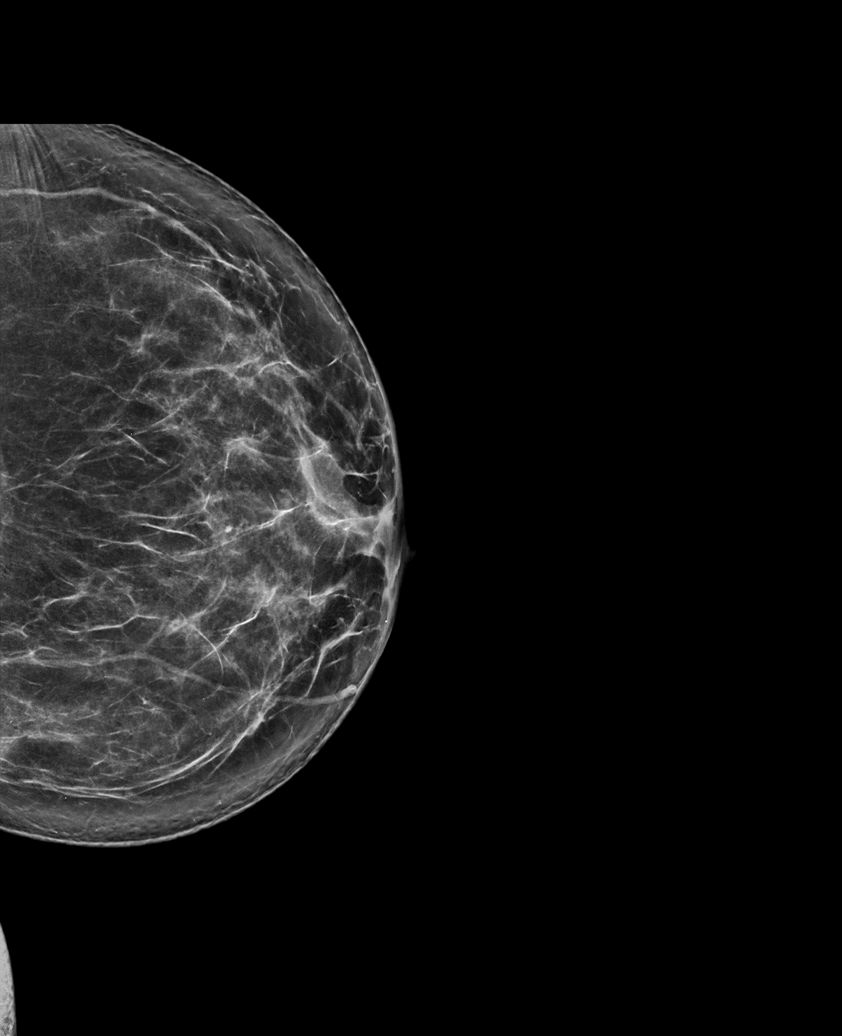

[6 of 36 positions shown; findings below may reference images not displayed]

ACR Breast Density Category c: The breast tissue is heterogeneously
dense, which may obscure small masses.
FINDINGS: Within the retroareolar left breast there is a small oval
circumscribed mass. No additional concerning masses, calcifications
or distortion identified within either breast.

Mammographic images were processed with CAD.

Targeted ultrasound is performed, showing no discrete dilated duct
or intraductal mass within the retroareolar right or left breast.

Within the left breast 3:30 o'clock retroareolar location there is a
7 x 5 x 2 mm mildly complicated cyst. This is felt to correspond
with mammographic abnormality.
IMPRESSION: Likely physiologic discharge given the bilateral nature and reported
whitish color. Given the spontaneous nature of the discharge
bilateral breast MRI is recommended.

No mammographic evidence for malignancy.

RECOMMENDATION:
Bilateral breast MRI for further evaluation of reported nipple
discharge.

Continued clinical evaluation for bilateral reported nipple
discharge.

Screening mammogram at age 40 unless there are persistent or
intervening clinical concerns. (Code:[L2])

I have discussed the findings and recommendations with the patient.
If applicable, a reminder letter will be sent to the patient
regarding the next appointment.

BI-RADS CATEGORY  2: Benign.

## 2019-09-14 ENCOUNTER — Other Ambulatory Visit: Payer: Self-pay | Admitting: *Deleted

## 2019-09-14 DIAGNOSIS — N6452 Nipple discharge: Secondary | ICD-10-CM

## 2019-10-02 ENCOUNTER — Other Ambulatory Visit: Payer: PRIVATE HEALTH INSURANCE

## 2019-10-25 ENCOUNTER — Other Ambulatory Visit: Payer: Self-pay

## 2019-10-25 ENCOUNTER — Ambulatory Visit
Admission: RE | Admit: 2019-10-25 | Discharge: 2019-10-25 | Disposition: A | Discharge status: Home / Self Care | Payer: PRIVATE HEALTH INSURANCE | Source: Ambulatory Visit | Attending: *Deleted | Admitting: *Deleted

## 2019-10-25 DIAGNOSIS — N6452 Nipple discharge: Secondary | ICD-10-CM

## 2019-10-25 IMAGING — MR MR BREAST BILAT WO/W CM
8 of 12 series · 33 of 48 positions shown · IV contrast (7ml Gadavist)
Comparison: Previous exam(s).

CLINICAL DATA: 33-year-old female with spontaneous, intermittent
bilateral nipple discharge.

LABS:  None performed on site.
EXAM:
BILATERAL BREAST MRI WITH AND WITHOUT CONTRAST
TECHNIQUE: Multiplanar, multisequence MR images of both breasts were obtained
prior to and following the intravenous administration of 7 ml of
Gadavist.

[Series 3: fl3d pre-cm no · axial · non-contrast · 1.2mm · 0.94mm/px · z∈[-67,+105]mm · 5 of 144 slices shown]
[im 1/144]
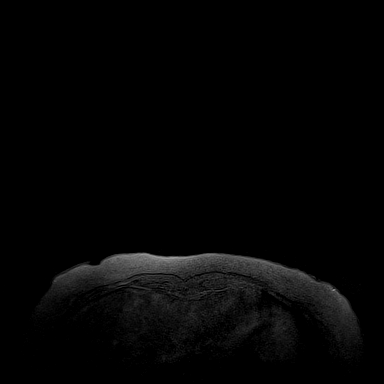
[im 36/144]
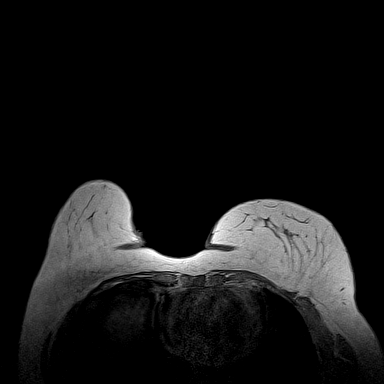
[im 72/144]
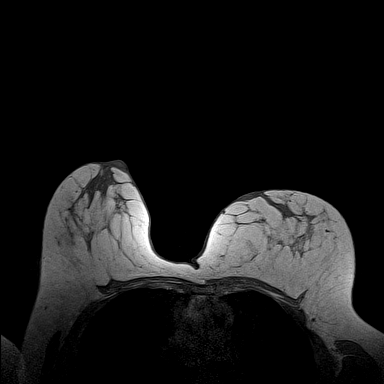
[im 108/144]
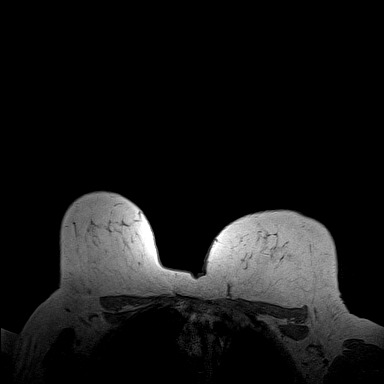
[im 144/144]
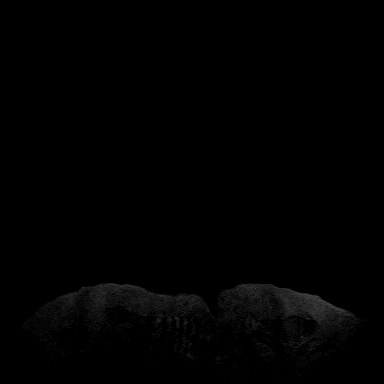

[Series 4: t2_tirm_tra ipat (a-p) · axial · 3.0mm · 0.70mm/px · 1 of 55 slices shown]
[im 1/55]
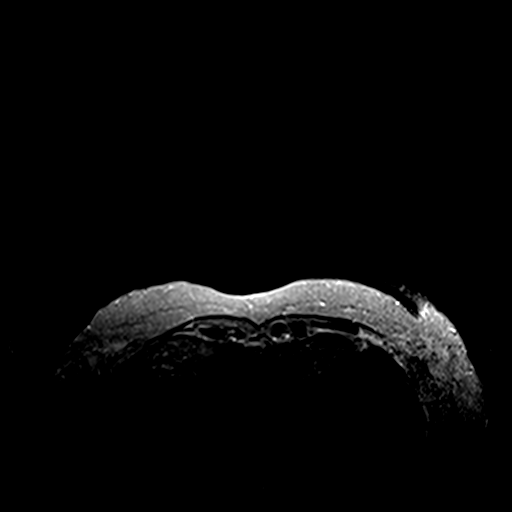

[Series 5: fl3d pre-cm · axial · non-contrast · 1.2mm · 0.94mm/px · z∈[-67,+105]mm · 5 of 144 slices shown]
[im 1/144]
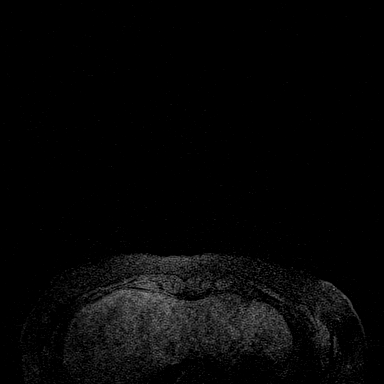
[im 36/144]
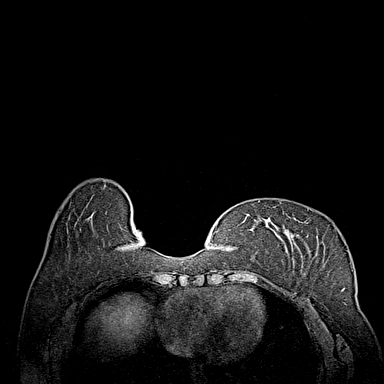
[im 72/144]
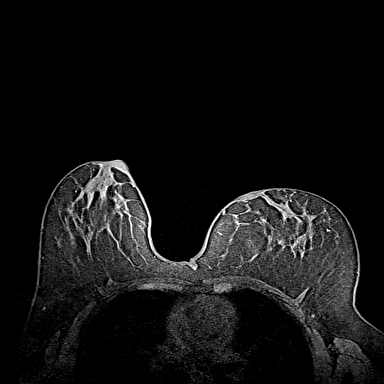
[im 108/144]
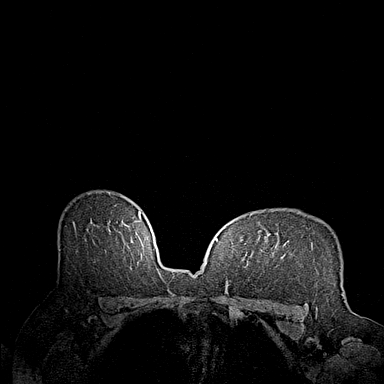
[im 144/144]
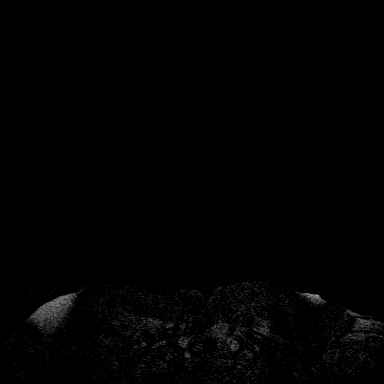

[Series 6: fl3d post-cm 20 · axial · 1.2mm · 0.94mm/px · z∈[-67,+105]mm · 5 of 144 slices shown (1 of 3)]
[im 1/144]
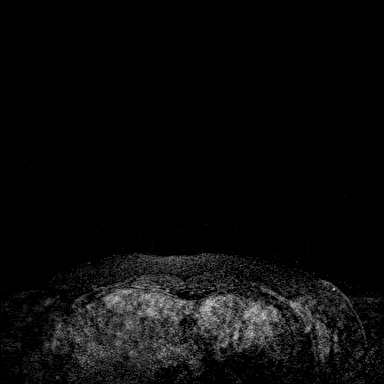
[im 36/144]
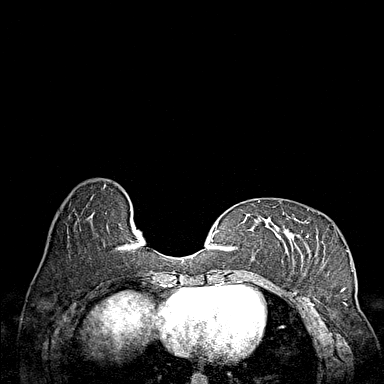
[im 72/144]
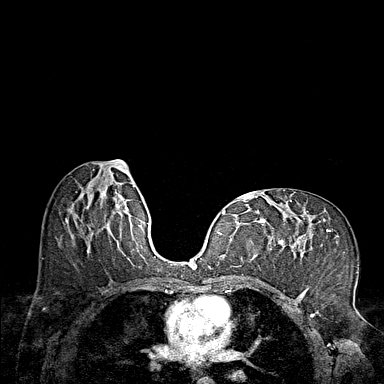
[im 108/144]
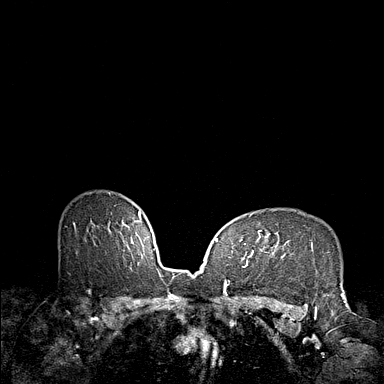
[im 144/144]
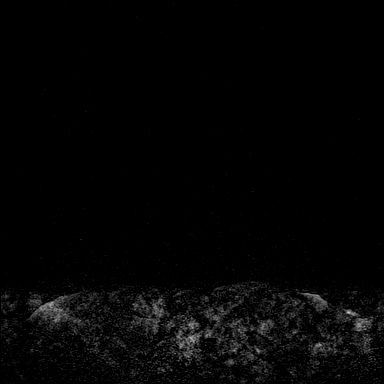

[Series 7: fl3d post-cm 20 · axial · 1.2mm · 0.94mm/px · z∈[-67,+105]mm · 5 of 144 slices shown (2 of 3)]
[im 1/144]
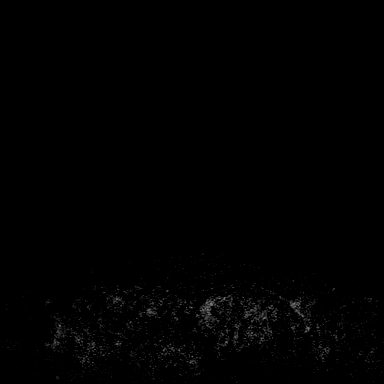
[im 36/144]
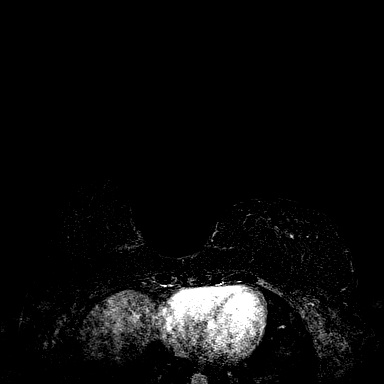
[im 72/144]
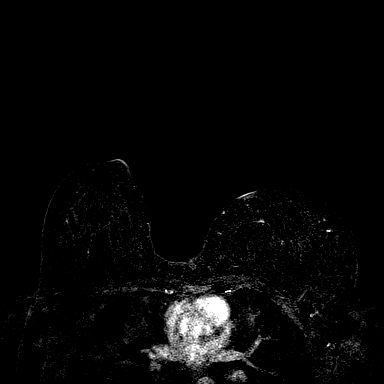
[im 108/144]
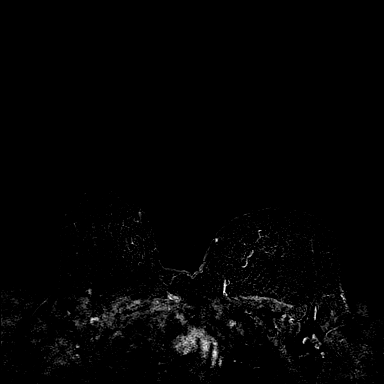
[im 144/144]
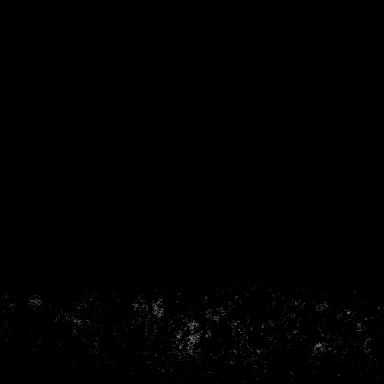

[Series 8: fl3d post-cm 20 · axial · 172.8mm · 0.94mm/px · 1 of 1 slices shown (3 of 3)]
[im 1/1]
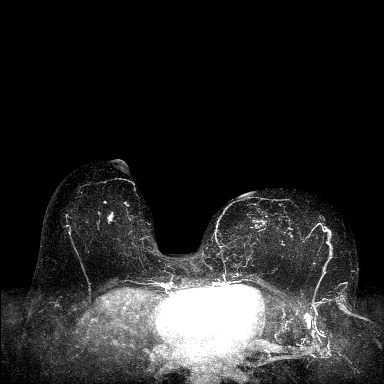

[Series 9: fl3d post-cm 3min · axial · 1.2mm · 0.94mm/px · z∈[-67,+105]mm · 6 of 144 slices shown]
[im 1/144]
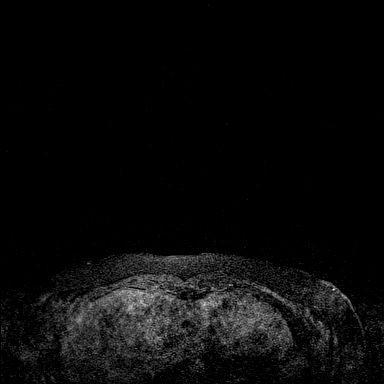
[im 29/144]
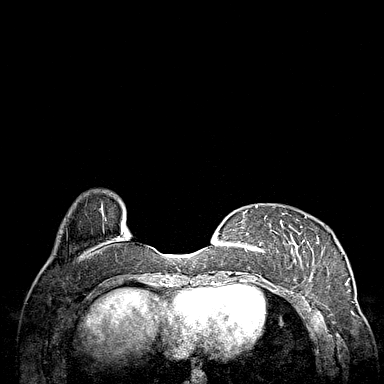
[im 58/144]
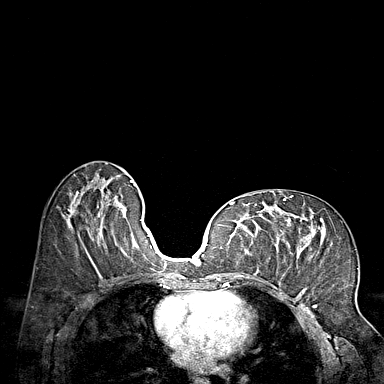
[im 86/144]
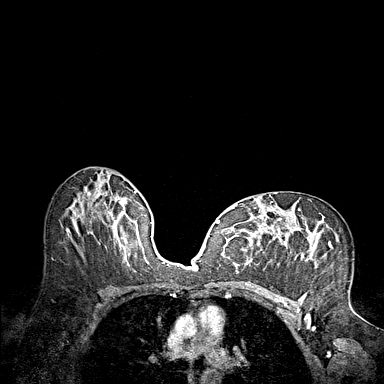
[im 115/144]
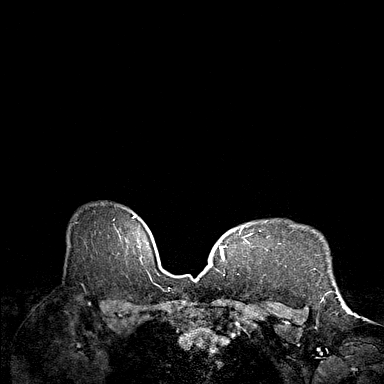
[im 144/144]
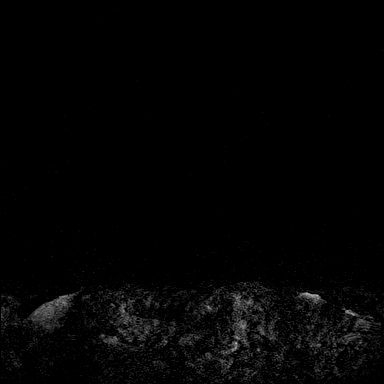

[Series 10: fl3d post-cm 3min_sub · axial · 1.2mm · 0.94mm/px · z∈[-67,+70]mm · 5 of 144 slices shown]
[im 1/144]
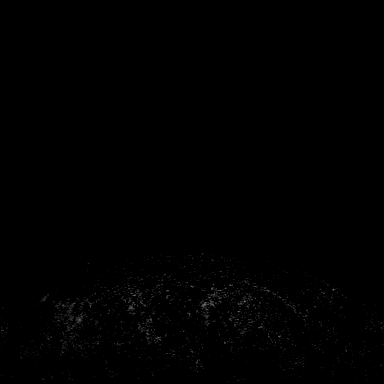
[im 29/144]
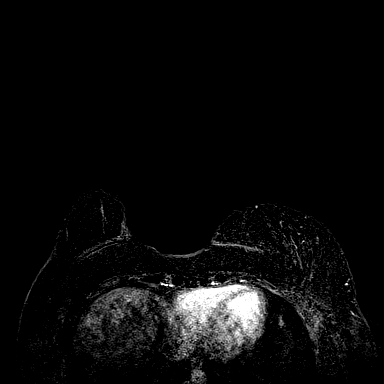
[im 58/144]
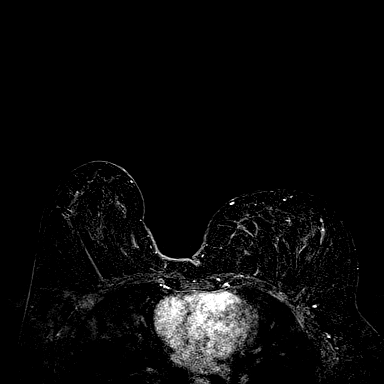
[im 86/144]
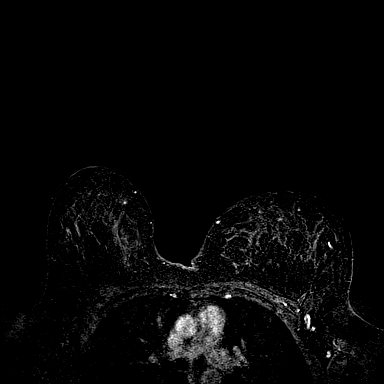
[im 115/144]
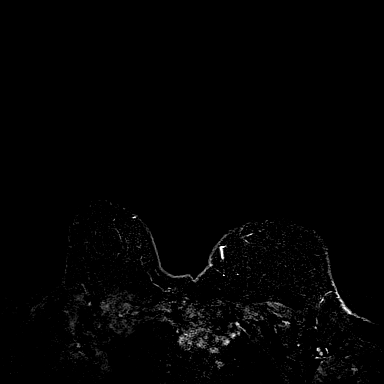

[33 of 48 positions shown; findings below may reference images not displayed]

Three-dimensional MR images were rendered by post-processing of the
original MR data on an independent workstation. The
three-dimensional MR images were interpreted, and findings are
reported in the following complete MRI report for this study. Three
dimensional images were evaluated at the independent [employer]
workstation
FINDINGS: Breast composition: c. Heterogeneous fibroglandular tissue.

Background parenchymal enhancement: Mild to moderate.

Right breast: There is an irregular, enhancing mass in the central
right breast at middle depth (series 7, image 78/144). It measures
1.3 x 0.7 x 0.6 cm. No other dominant mass or suspicious
enhancement.

Left breast: Focal non-mass enhancement is identified in the upper
inner left breast at middle depth (series 7, image 68/144). It
measures 1.5 by 1.3 cm in axial dimensions. No other dominant mass
or suspicious enhancement.

Lymph nodes: No abnormal appearing lymph nodes.

Ancillary findings:  None.
IMPRESSION: 1. Suspicious 1.3 cm enhancing mass in the central right breast
(series 7, image 78). Recommendation is for MRI guided biopsy.
2. Indeterminate non-mass enhancement in the upper inner left breast
(series 7, image 68). Recommendation is for MRI guided biopsy.
3. No suspicious lymphadenopathy.

RECOMMENDATION:
Bilateral MRI guided biopsies.

BI-RADS CATEGORY  4: Suspicious.

## 2019-10-25 MED ORDER — GADOBUTROL 1 MMOL/ML IV SOLN
7.0000 mL | Freq: Once | INTRAVENOUS | Status: AC | PRN
  Administered 2019-10-25: 7 mL via INTRAVENOUS

## 2019-10-30 ENCOUNTER — Other Ambulatory Visit: Payer: Self-pay | Admitting: *Deleted

## 2019-10-30 DIAGNOSIS — R9389 Abnormal findings on diagnostic imaging of other specified body structures: Secondary | ICD-10-CM

## 2019-11-09 ENCOUNTER — Other Ambulatory Visit: Payer: Self-pay

## 2019-11-09 ENCOUNTER — Ambulatory Visit
Admission: RE | Admit: 2019-11-09 | Discharge: 2019-11-09 | Disposition: A | Discharge status: Home / Self Care | Payer: PRIVATE HEALTH INSURANCE | Source: Ambulatory Visit | Attending: *Deleted | Admitting: *Deleted

## 2019-11-09 DIAGNOSIS — R9389 Abnormal findings on diagnostic imaging of other specified body structures: Secondary | ICD-10-CM

## 2019-11-09 IMAGING — MG MM BREAST LOCALIZATION CLIP
4 series · 4 of 12 positions shown · non-contrast
Comparison: Previous exam(s).

CLINICAL DATA: Post MRI guided biopsy of non mass enhancement in
the upper inner left breast and MRI guided biopsy of an enhancing
mass in the upper central right breast.

EXAM:
DIAGNOSTIC BILATERAL MAMMOGRAM POST MRI BIOPSY

[L CC synth-2D]
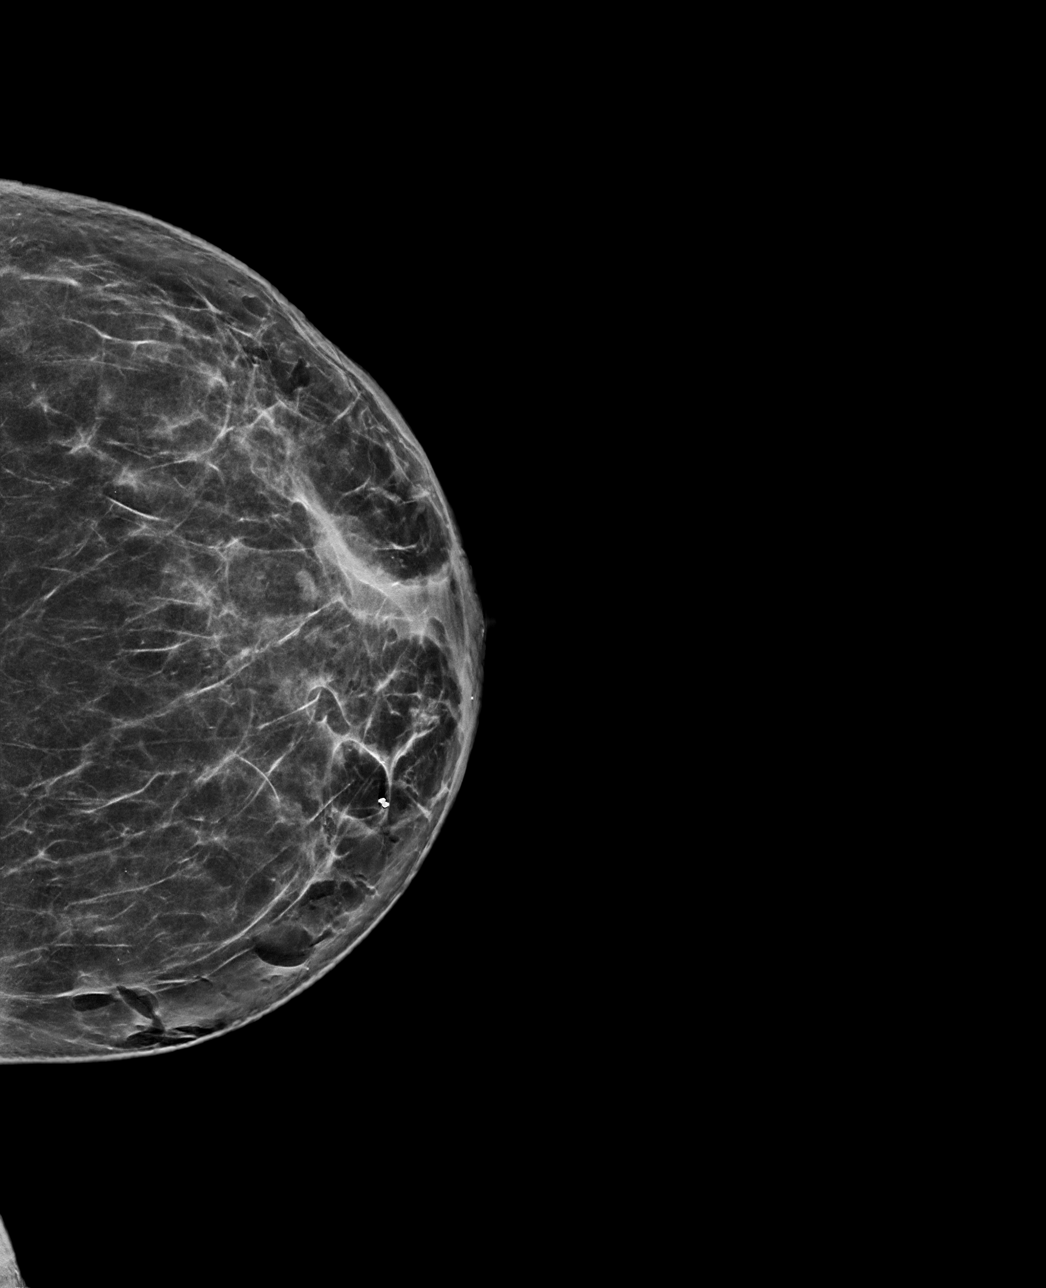

[L ML synth-2D]
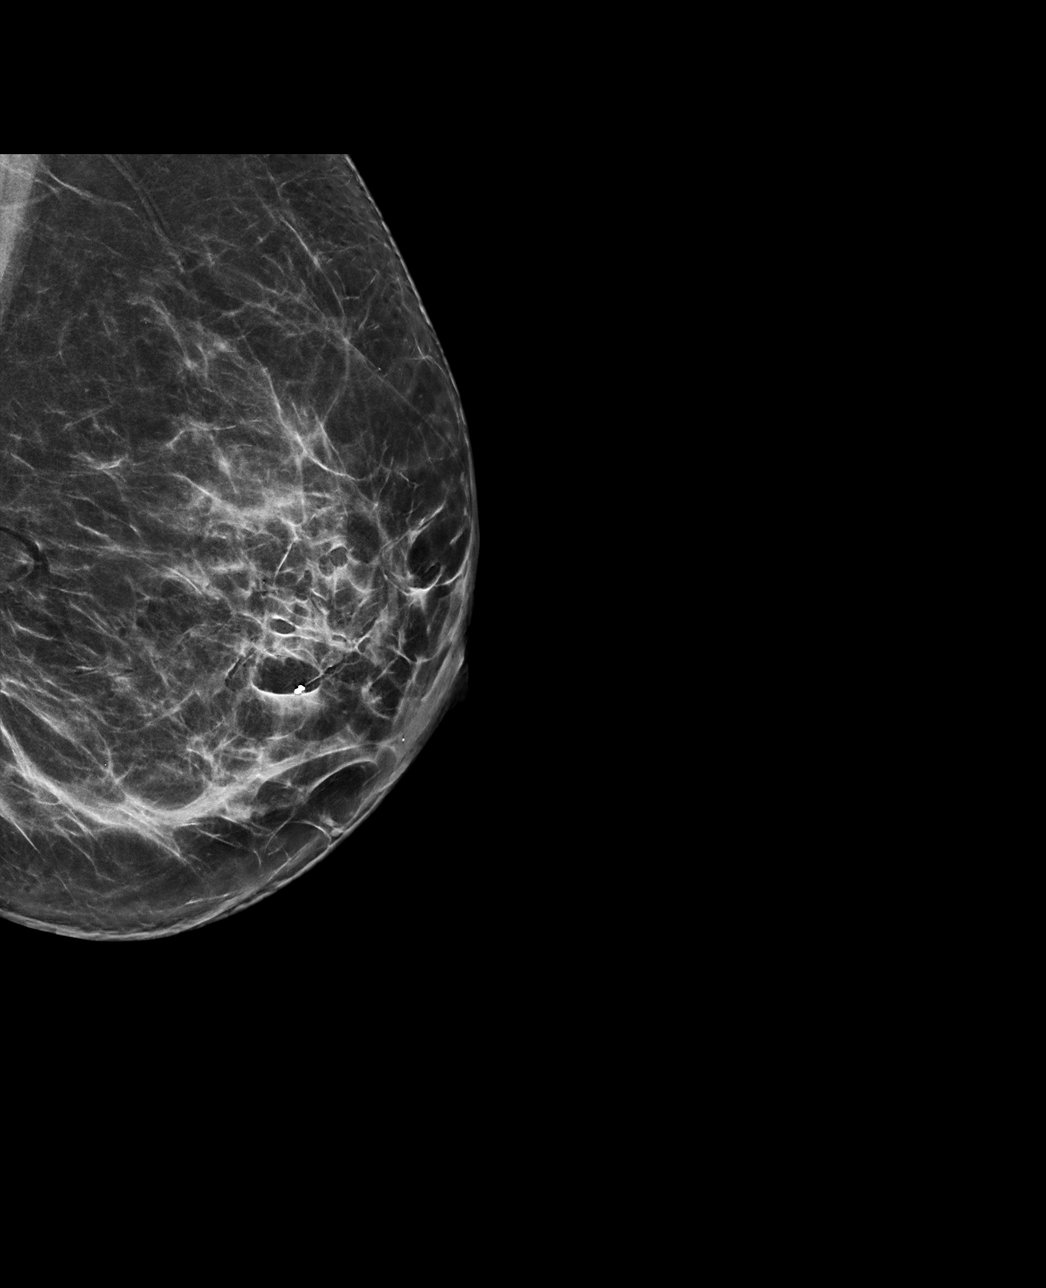

[L ML tomo · tomo slice 39/77.0]
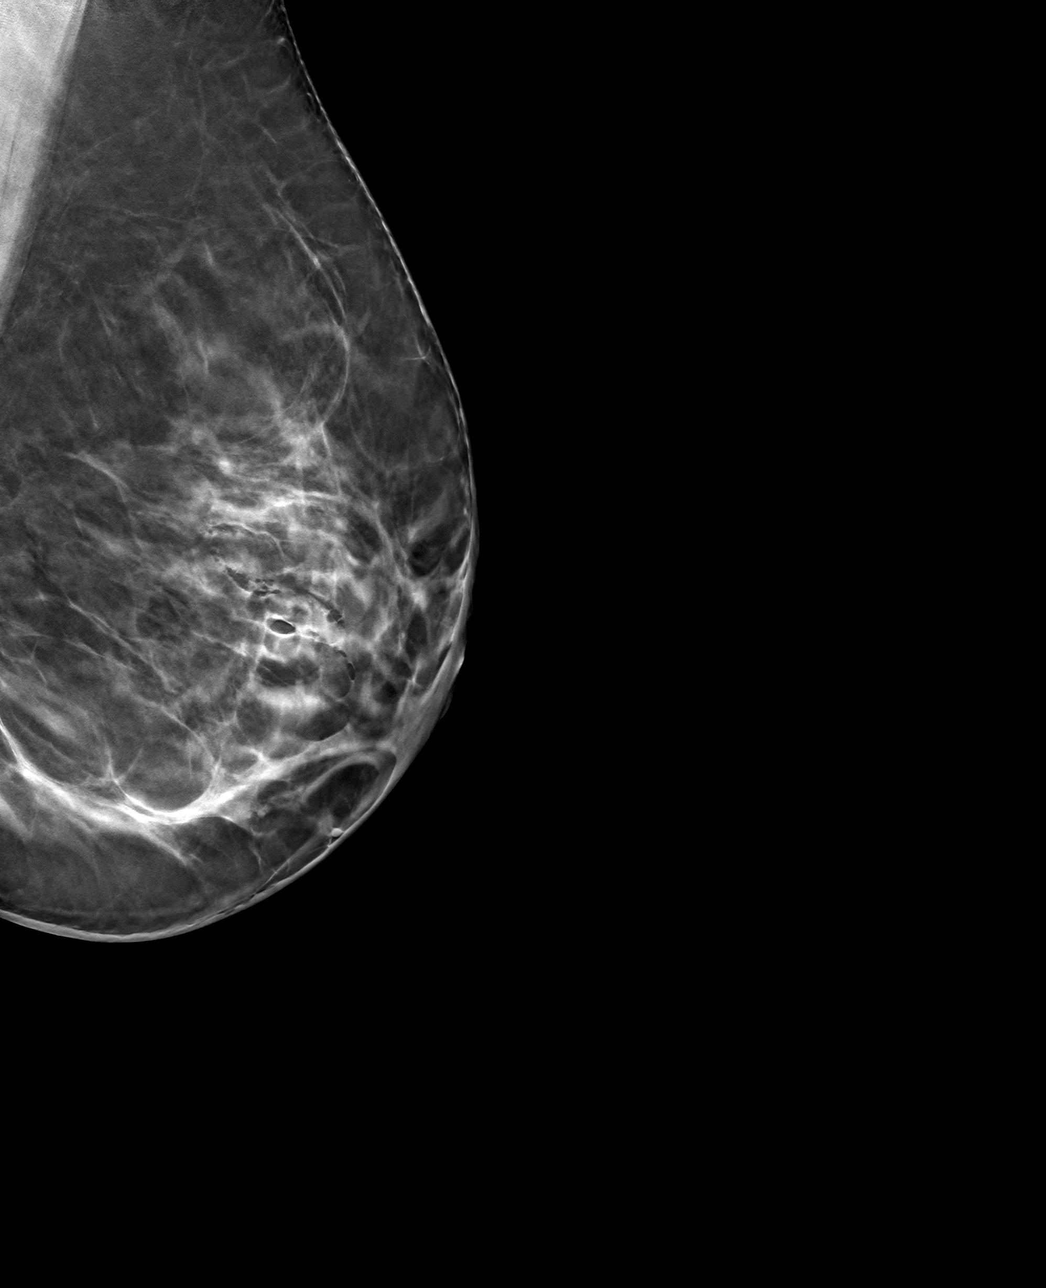

[L CC tomo · tomo slice 39/78.0]
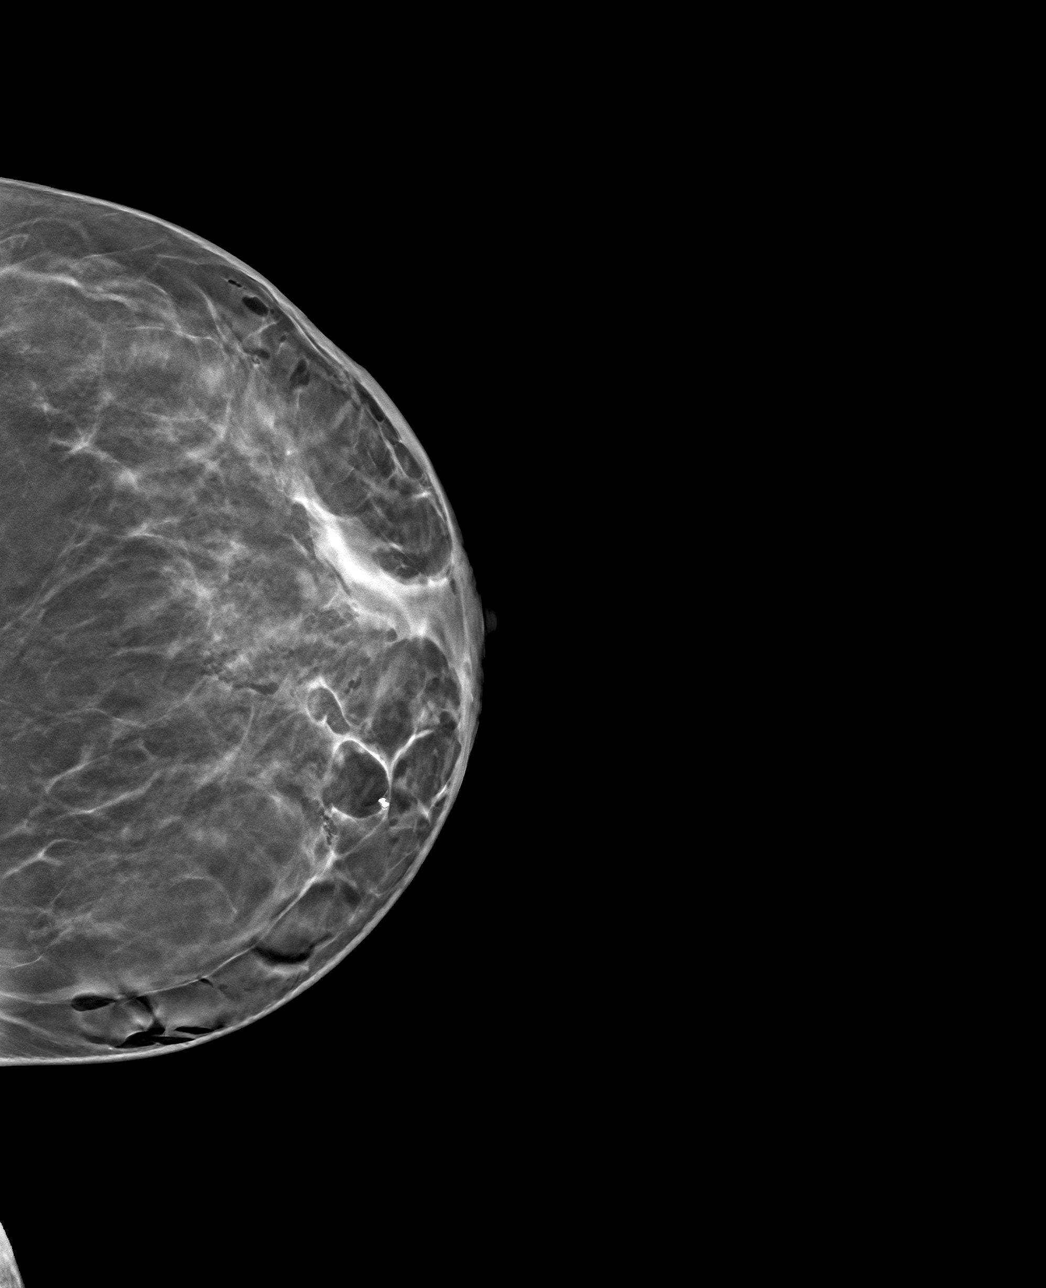

[4 of 12 positions shown; findings below may reference images not displayed]

FINDINGS: Mammographic images were obtained following MRI guided biopsy of non
mass enhancement in the upper inner left breast and MRI guided
biopsy of an enhancing mass in the upper central right breast. A
dumbbell shaped biopsy marking clip is present at the site of the
biopsied non mass enhancement in the slightly upper inner left
breast. A dumbbell shaped biopsy marking clip is present and felt to
be positioned 1.5 cm medial to the biopsied enhancing mass within
the slightly upper central right breast.
IMPRESSION: 1. Dumbbell shaped biopsy marking clip at site of biopsied non mass
enhancement in the slightly upper inner left breast.

2. Dumbbell shaped biopsy marking clip felt to be positioned
approximately 1.5 cm medial to the biopsied enhancing mass within
the slightly upper central right breast.

Final Assessment: Post Procedure Mammograms for Marker Placement

## 2019-11-09 IMAGING — MG MM BREAST LOCALIZATION CLIP
4 series · 4 of 12 positions shown · non-contrast
Comparison: Previous exam(s).

CLINICAL DATA: Post MRI guided biopsy of non mass enhancement in
the upper inner left breast and MRI guided biopsy of an enhancing
mass in the upper central right breast.

EXAM:
DIAGNOSTIC BILATERAL MAMMOGRAM POST MRI BIOPSY

[R CC synth-2D]
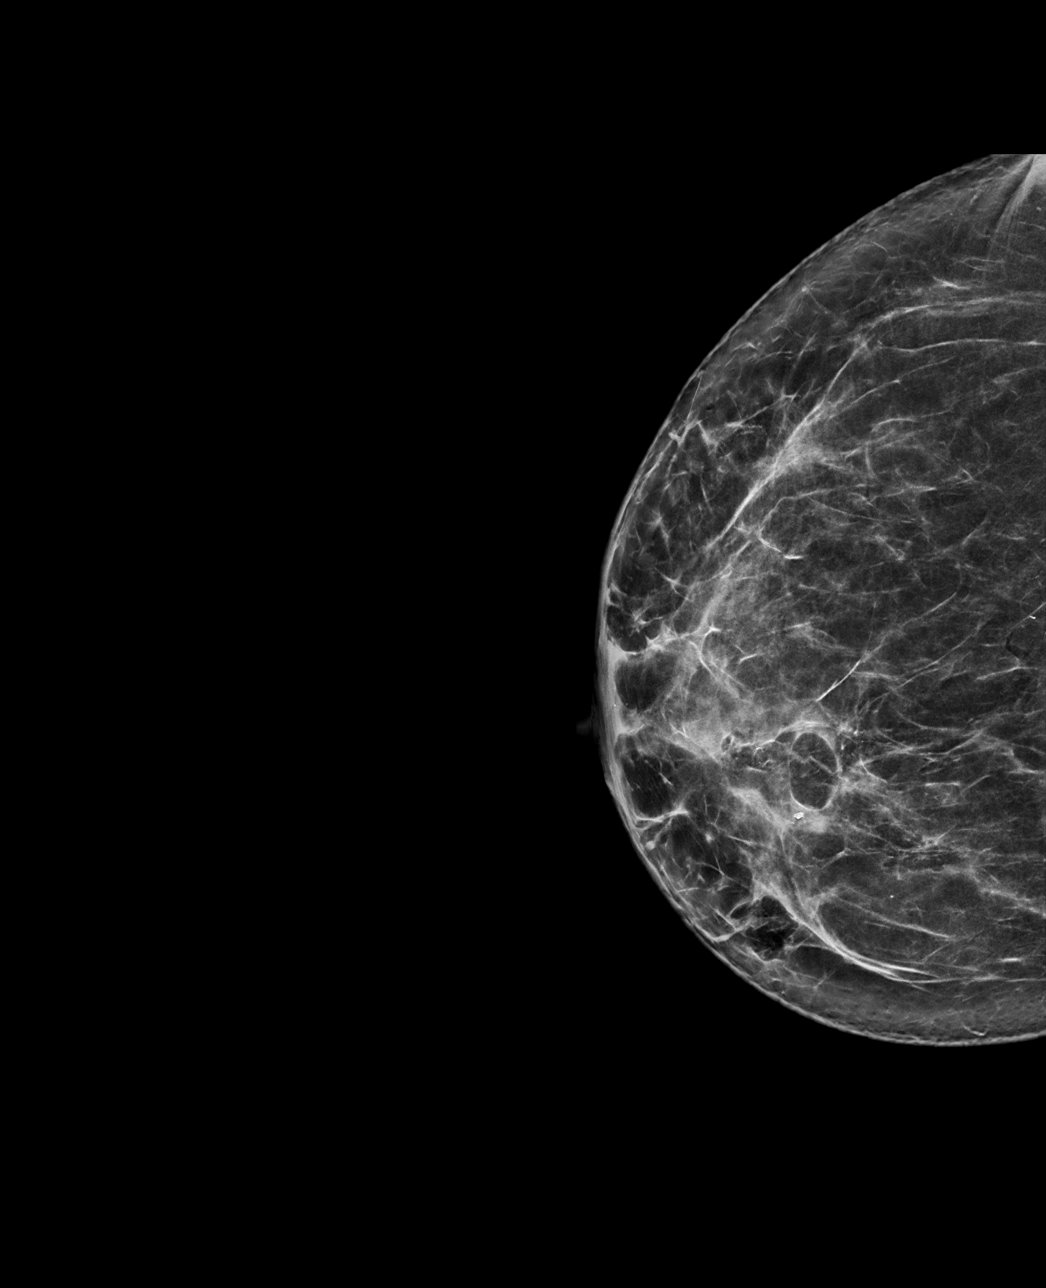

[R ML synth-2D]
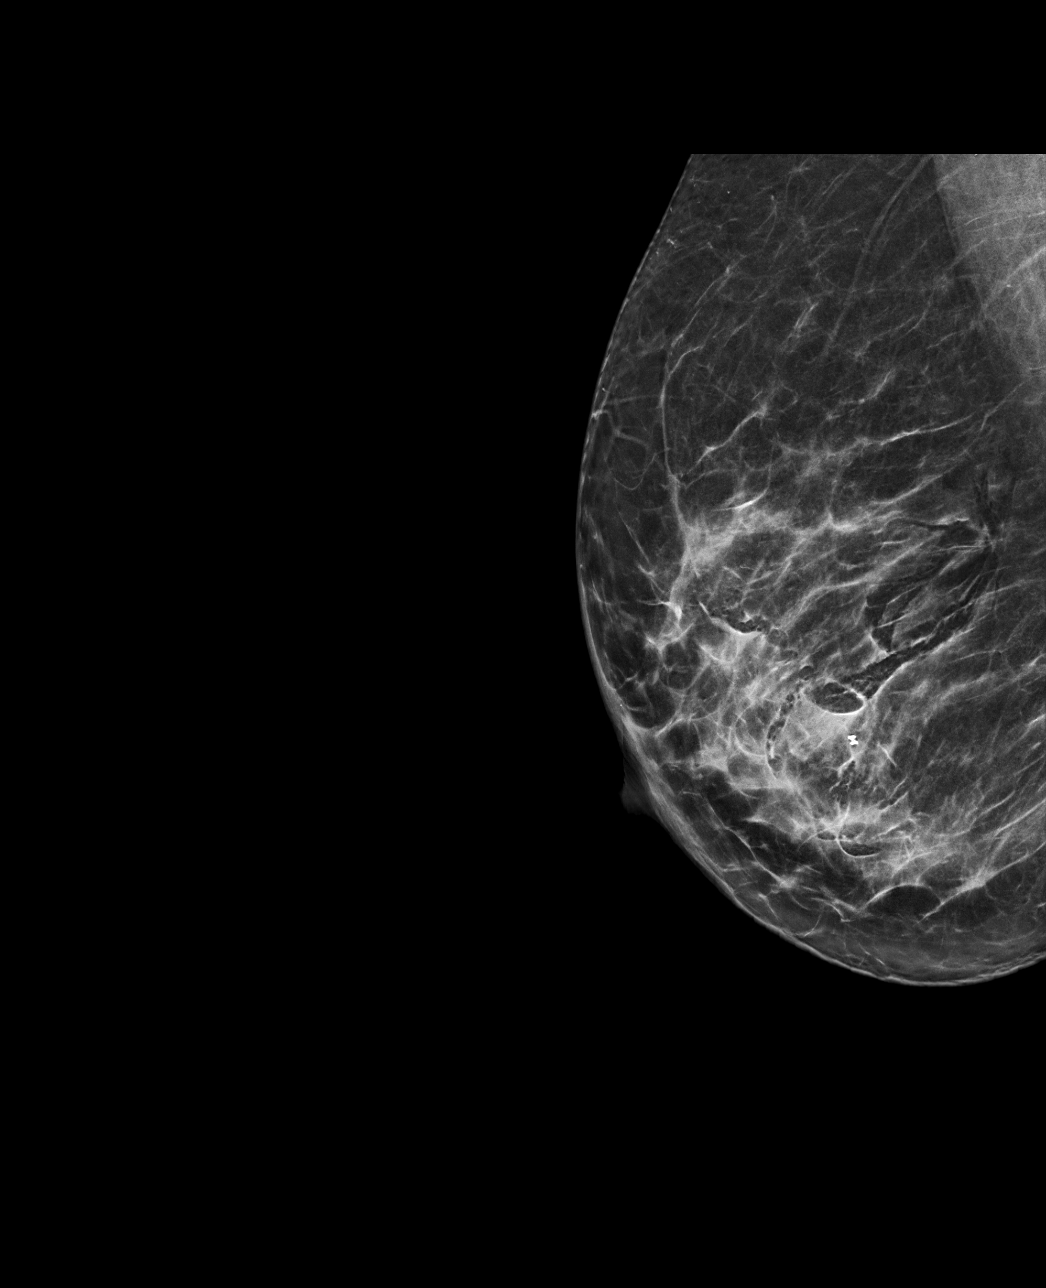

[R CC tomo · tomo slice 39/77.0]
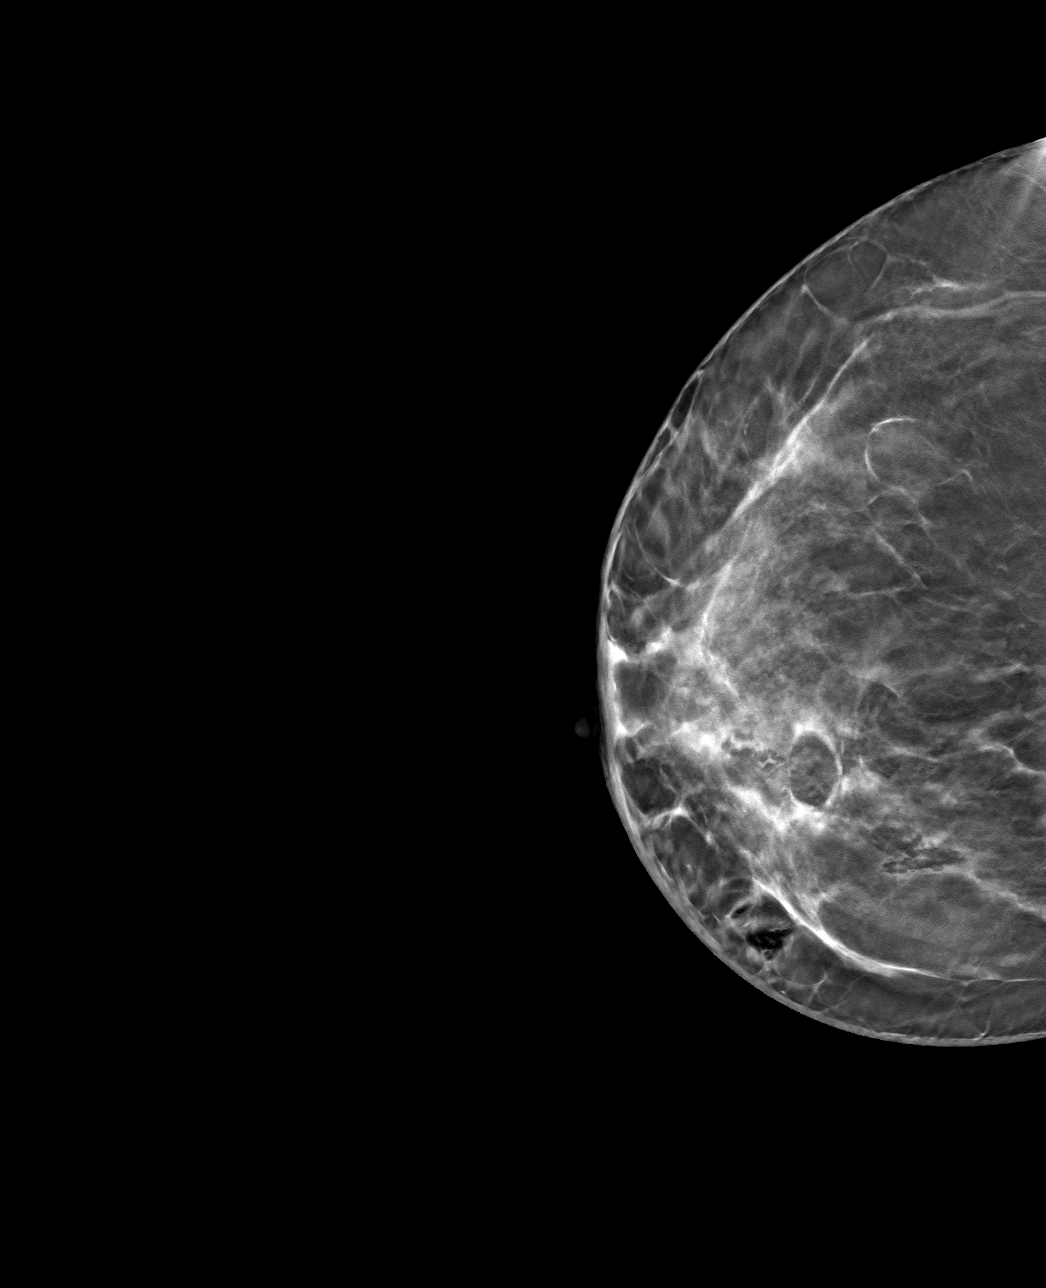

[R ML tomo · tomo slice 37/72.0]
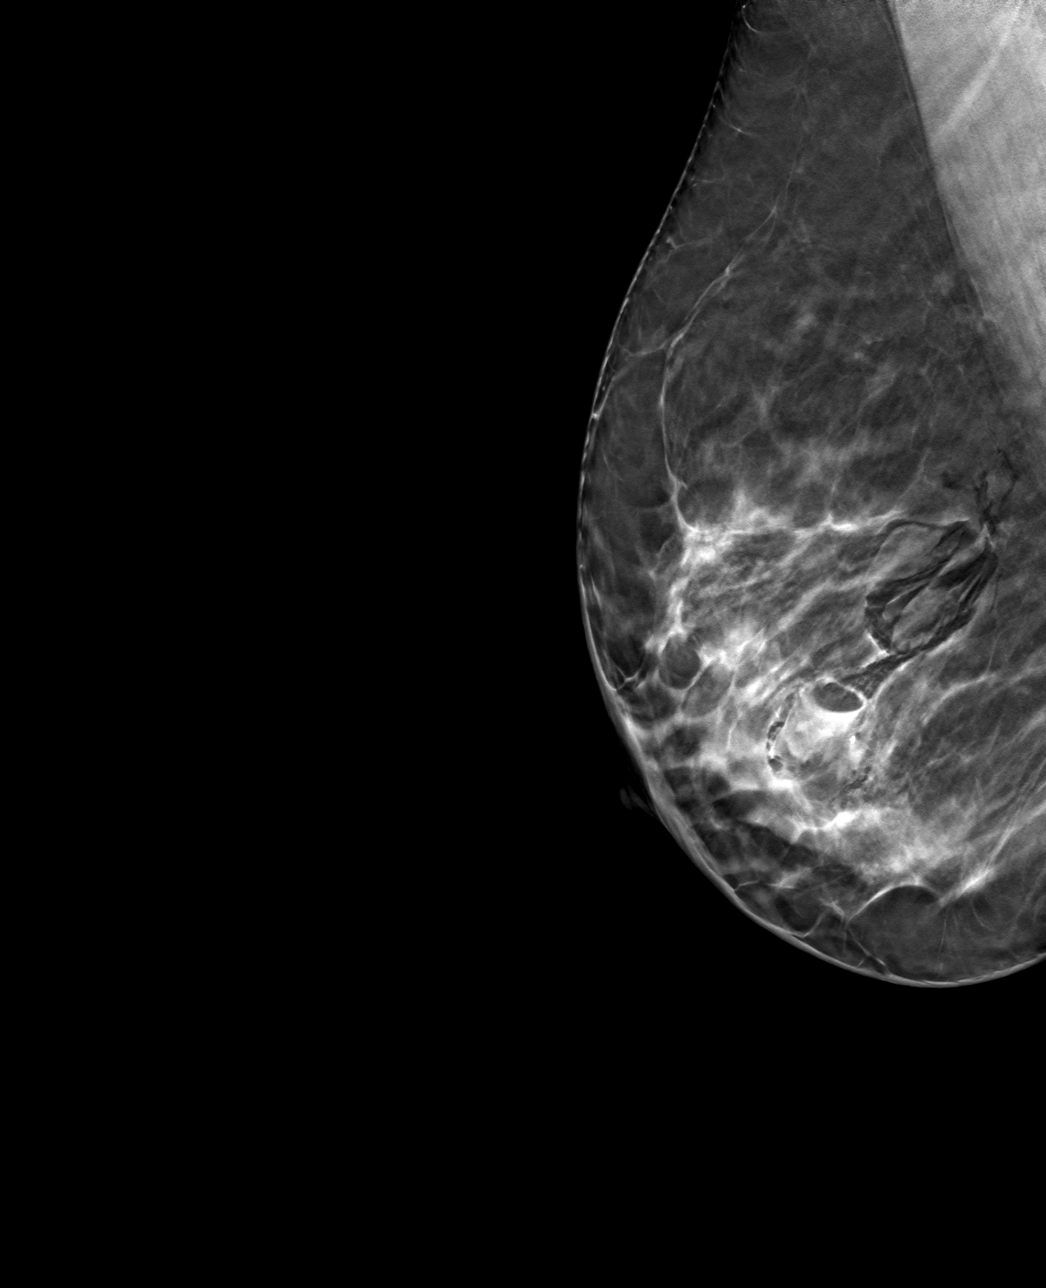

[4 of 12 positions shown; findings below may reference images not displayed]

FINDINGS: Mammographic images were obtained following MRI guided biopsy of non
mass enhancement in the upper inner left breast and MRI guided
biopsy of an enhancing mass in the upper central right breast. A
dumbbell shaped biopsy marking clip is present at the site of the
biopsied non mass enhancement in the slightly upper inner left
breast. A dumbbell shaped biopsy marking clip is present and felt to
be positioned 1.5 cm medial to the biopsied enhancing mass within
the slightly upper central right breast.
IMPRESSION: 1. Dumbbell shaped biopsy marking clip at site of biopsied non mass
enhancement in the slightly upper inner left breast.

2. Dumbbell shaped biopsy marking clip felt to be positioned
approximately 1.5 cm medial to the biopsied enhancing mass within
the slightly upper central right breast.

Final Assessment: Post Procedure Mammograms for Marker Placement

## 2019-11-09 IMAGING — MR MR BREAST BX W/ LOC DEV 1ST LEASION IMAGE BX SPEC MR GUIDE*R*
8 of 11 series · 35 of 48 positions shown · IV contrast (gadavist)
Comparison: Prior exams.
COMPARISON: Prior exams.

Addendum:
CLINICAL DATA: 33-year-old female with enhancing mass in the
central upper right breast and non mass enhancement within the upper
inner left breast.

EXAM:
MRI GUIDED CORE NEEDLE BIOPSY OF THE BILATERAL BREASTS
TECHNIQUE: Multiplanar, multisequence MR imaging of the bilateral breast was
performed both before and after administration of intravenous
contrast.
CONTRAST:  7 mL Gadavist.

[Series 2: fiducial bilateral · sagittal · 2.0mm · 1.33mm/px · 6 of 160 slices shown (1 of 2)]
[im 1/160]
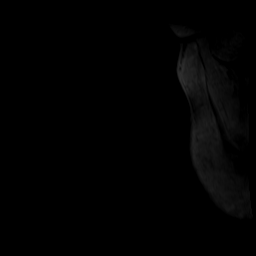
[im 32/160]
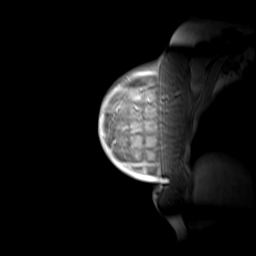
[im 64/160]
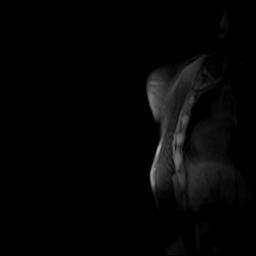
[im 96/160]
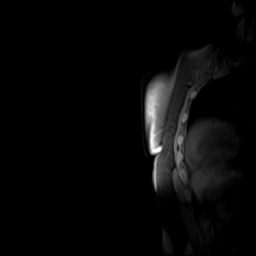
[im 128/160]
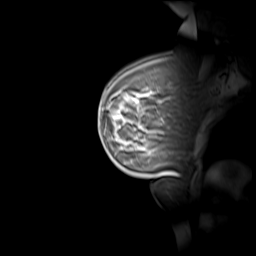
[im 160/160]
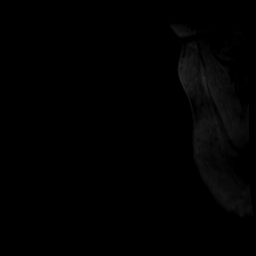

[Series 3: dynamic pre · axial · non-contrast · 1.3mm · 0.73mm/px · z∈[-1,+184]mm · 5 of 144 slices shown]
[im 1/144]
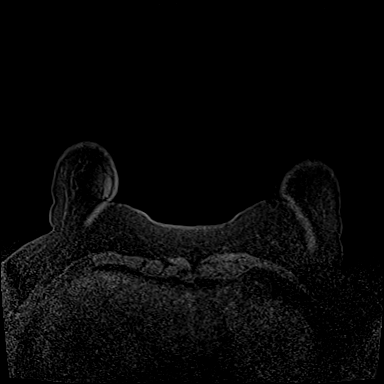
[im 36/144]
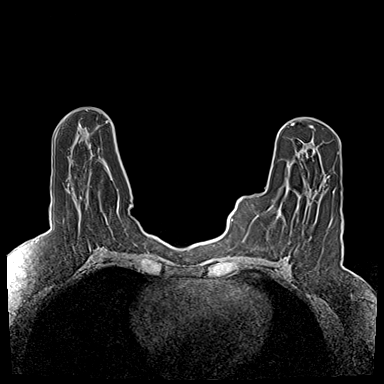
[im 72/144]
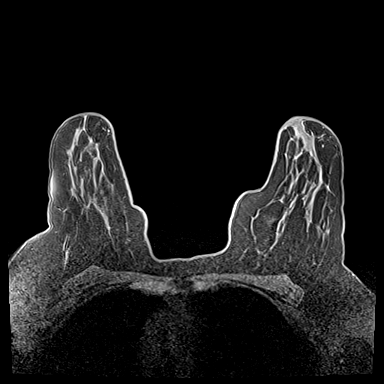
[im 108/144]
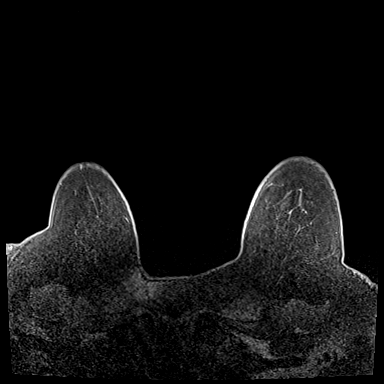
[im 144/144]
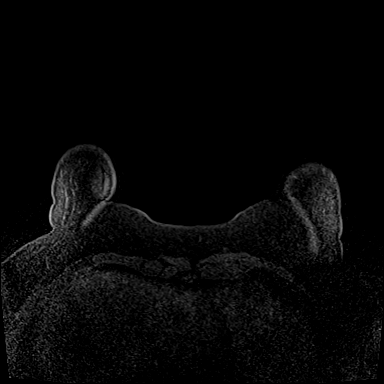

[Series 4: fiducial bilateral · sagittal · 2.0mm · 1.33mm/px · 5 of 160 slices shown (2 of 2)]
[im 1/160]
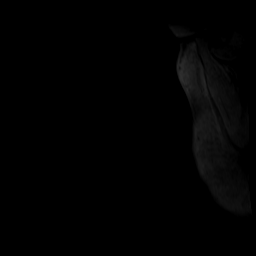
[im 40/160]
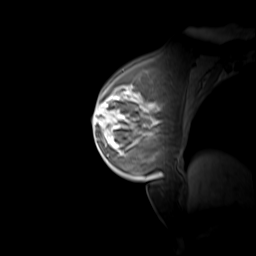
[im 80/160]
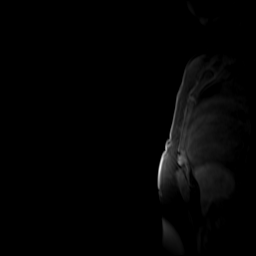
[im 120/160]
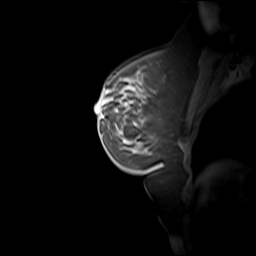
[im 160/160]
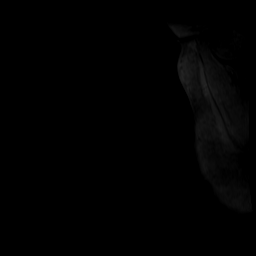

[Series 5: dynamic post 20 · axial · 1.3mm · 0.73mm/px · z∈[-1,+184]mm · 4 of 144 slices shown (1 of 2)]
[im 1/144]
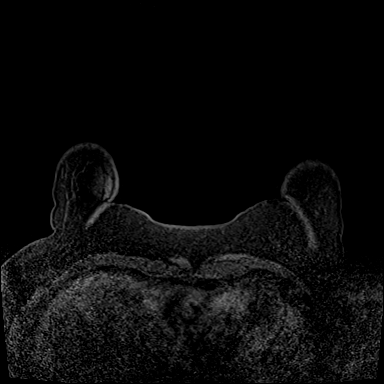
[im 48/144]
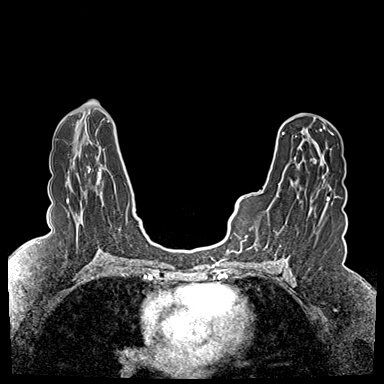
[im 96/144]
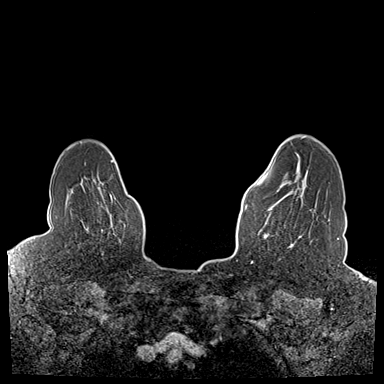
[im 144/144]
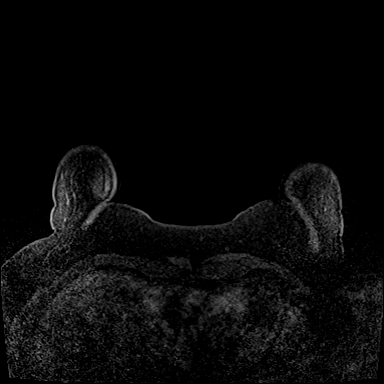

[Series 6: dynamic post 20 · axial · 1.3mm · 0.73mm/px · z∈[-1,+184]mm · 4 of 144 slices shown (2 of 2)]
[im 1/144]
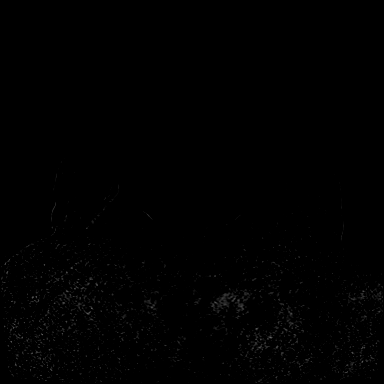
[im 48/144]
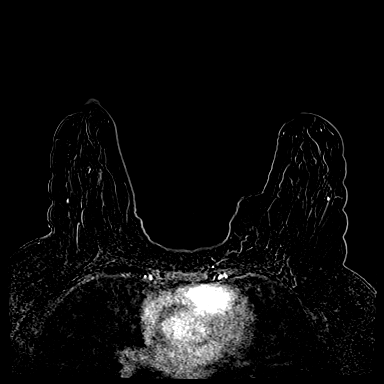
[im 96/144]
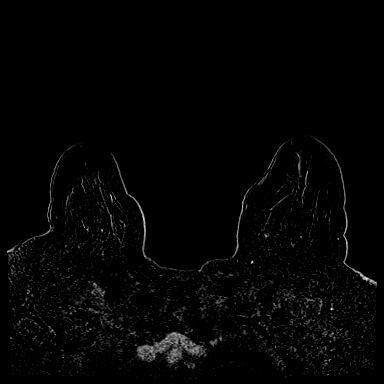
[im 144/144]
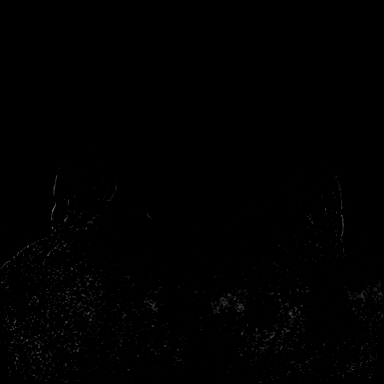

[Series 7: dynamic post 3 · axial · 1.3mm · 0.73mm/px · z∈[-1,+184]mm · 4 of 144 slices shown (1 of 2)]
[im 1/144]
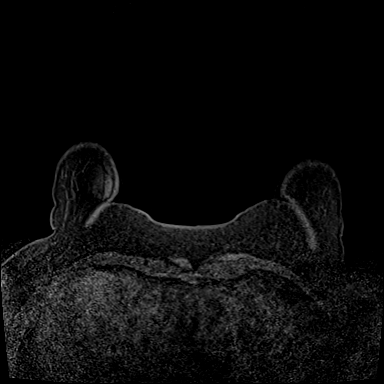
[im 48/144]
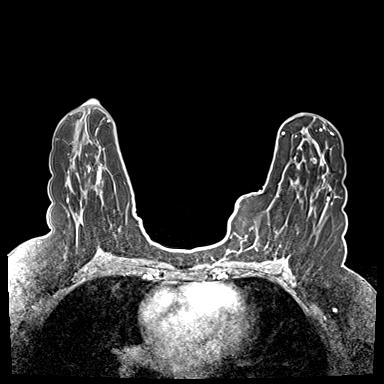
[im 96/144]
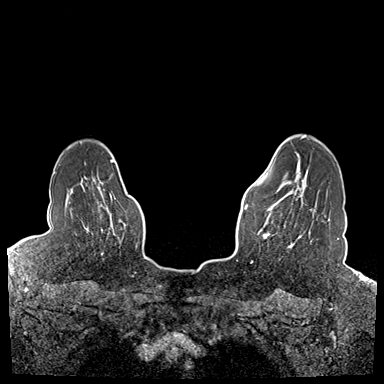
[im 144/144]
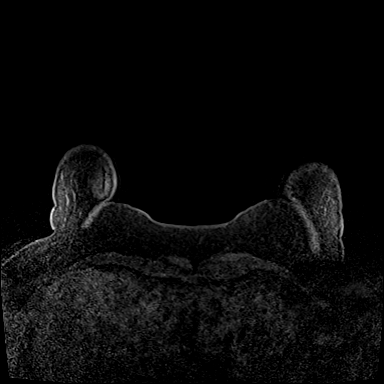

[Series 8: dynamic post 3 · axial · 1.3mm · 0.73mm/px · z∈[-1,+184]mm · 4 of 144 slices shown (2 of 2)]
[im 1/144]
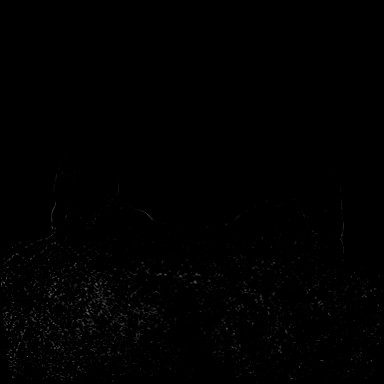
[im 48/144]
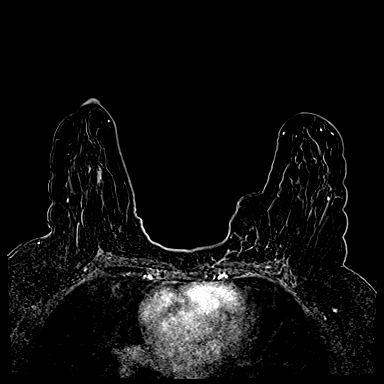
[im 96/144]
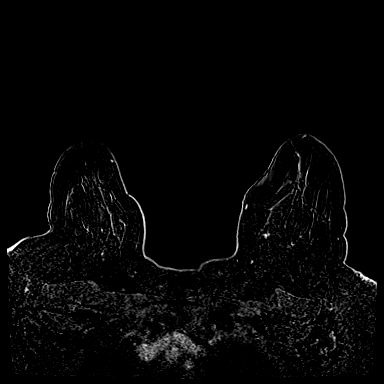
[im 144/144]
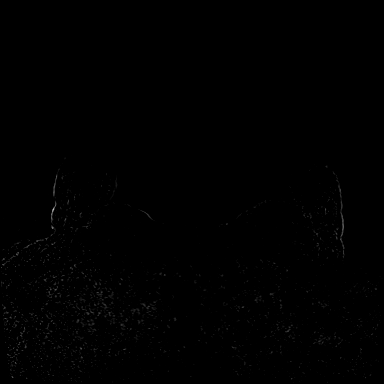

[Series 9: needle confirmation · axial · 1.3mm · 0.73mm/px · z∈[-1,+122]mm · 3 of 144 slices shown]
[im 1/144]
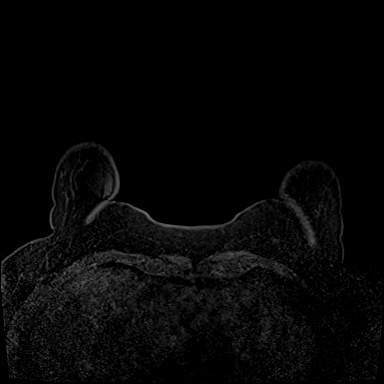
[im 48/144]
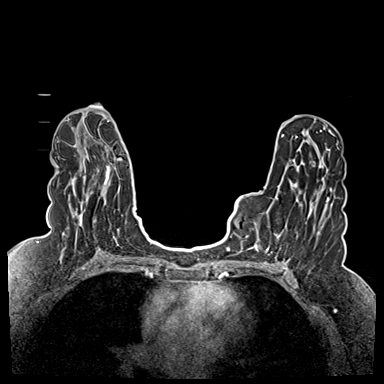
[im 96/144]
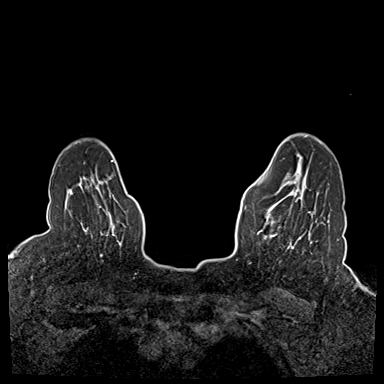

[35 of 48 positions shown; findings below may reference images not displayed]

FINDINGS: I met with the patient, and we discussed the procedure of MRI guided
biopsy, including risks, benefits, and alternatives. Specifically,
we discussed the risks of infection, bleeding, tissue injury, clip
migration, and inadequate sampling. Informed, written consent was
given. The usual time out protocol was performed immediately prior
to the procedure.

LEFT BREAST: Using sterile technique, 1% Lidocaine, MRI guidance,
and a 9 gauge vacuum assisted device, biopsy was performed of the
non mass enhancement in the upper inner left breast using a lateral
to medial approach. At the conclusion of the procedure, a dumbbell
shaped tissue marker clip was deployed into the biopsy cavity.
Follow-up 2-view mammogram was performed and dictated separately.

RIGHT BREAST: Using sterile technique, 1% Lidocaine, MRI guidance,
and a 9 gauge vacuum assisted device, biopsy was performed of the
enhancing mass in the upper central right breast using a lateral to
medial approach. At the conclusion of the procedure, a dumbbell
shaped tissue marker clip was deployed into the biopsy cavity.
Follow-up 2-view mammogram was performed and dictated separately.
IMPRESSION: 1. MRI guided biopsy of the non mass enhancement in the upper inner
left breast, site of dumbbell shaped biopsy marking clip.

2. MRI guided biopsy of the enhancing mass in the upper central
right breast, at site of dumbbell shaped biopsy marking clip.

ADDENDUM:
Pathology revealed COLUMNAR CELL AND FIBROCYSTIC CHANGES WITH
APOCRINE METAPLASIA of the LEFT breast, upper inner. This was found
to be concordant by Dr. MATT.

Pathology revealed FOCAL USUAL DUCTAL HYPERPLASIA AND FIBROCYSTIC
CHANGES of the RIGHT breast, upper central. This was found to be
concordant by Dr. MATT.

Pathology results were discussed with the patient by telephone. The
patient reported doing well after the biopsies with tenderness at
the sites, more so on the RIGHT. Post biopsy instructions and care
were reviewed and questions were answered. The patient was
encouraged to call The [REDACTED] for any
additional concerns.

The patient was instructed to return for a bilateral breast MRI in 6
months, per protocol, and to have continued clinical evaluation for
BILATERAL intermittent spontaneous nipple discharge.

Pathology results reported by MATT, RN on [DATE].

*** End of Addendum ***
FINDINGS: I met with the patient, and we discussed the procedure of MRI guided
biopsy, including risks, benefits, and alternatives. Specifically,
we discussed the risks of infection, bleeding, tissue injury, clip
migration, and inadequate sampling. Informed, written consent was
given. The usual time out protocol was performed immediately prior
to the procedure.

LEFT BREAST: Using sterile technique, 1% Lidocaine, MRI guidance,
and a 9 gauge vacuum assisted device, biopsy was performed of the
non mass enhancement in the upper inner left breast using a lateral
to medial approach. At the conclusion of the procedure, a dumbbell
shaped tissue marker clip was deployed into the biopsy cavity.
Follow-up 2-view mammogram was performed and dictated separately.

RIGHT BREAST: Using sterile technique, 1% Lidocaine, MRI guidance,
and a 9 gauge vacuum assisted device, biopsy was performed of the
enhancing mass in the upper central right breast using a lateral to
medial approach. At the conclusion of the procedure, a dumbbell
shaped tissue marker clip was deployed into the biopsy cavity.
Follow-up 2-view mammogram was performed and dictated separately.
IMPRESSION: 1. MRI guided biopsy of the non mass enhancement in the upper inner
left breast, site of dumbbell shaped biopsy marking clip.

2. MRI guided biopsy of the enhancing mass in the upper central
right breast, at site of dumbbell shaped biopsy marking clip.

## 2019-11-09 MED ORDER — GADOBUTROL 1 MMOL/ML IV SOLN
7.0000 mL | Freq: Once | INTRAVENOUS | Status: AC | PRN
  Administered 2019-11-09: 7 mL via INTRAVENOUS

## 2020-05-01 ENCOUNTER — Other Ambulatory Visit: Payer: Self-pay | Admitting: *Deleted

## 2020-05-01 DIAGNOSIS — N6452 Nipple discharge: Secondary | ICD-10-CM

## 2020-05-16 ENCOUNTER — Other Ambulatory Visit: Payer: Self-pay | Admitting: *Deleted

## 2020-05-16 ENCOUNTER — Other Ambulatory Visit: Payer: Self-pay

## 2020-05-16 ENCOUNTER — Ambulatory Visit
Admission: RE | Admit: 2020-05-16 | Discharge: 2020-05-16 | Disposition: A | Discharge status: Home / Self Care | Payer: 59 | Source: Ambulatory Visit | Attending: *Deleted | Admitting: *Deleted

## 2020-05-16 ENCOUNTER — Ambulatory Visit: Payer: PRIVATE HEALTH INSURANCE

## 2020-05-16 DIAGNOSIS — N6452 Nipple discharge: Secondary | ICD-10-CM

## 2020-05-31 ENCOUNTER — Other Ambulatory Visit: Payer: 59

## 2020-06-05 ENCOUNTER — Other Ambulatory Visit: Payer: Self-pay | Admitting: *Deleted

## 2020-06-06 ENCOUNTER — Other Ambulatory Visit: Payer: Self-pay | Admitting: *Deleted

## 2020-06-06 ENCOUNTER — Other Ambulatory Visit: Payer: 59

## 2020-07-13 ENCOUNTER — Other Ambulatory Visit: Payer: Self-pay

## 2020-07-13 ENCOUNTER — Ambulatory Visit
Admission: RE | Admit: 2020-07-13 | Discharge: 2020-07-13 | Disposition: A | Discharge status: Home / Self Care | Payer: 59 | Source: Ambulatory Visit | Attending: *Deleted | Admitting: *Deleted

## 2020-07-13 DIAGNOSIS — N6452 Nipple discharge: Secondary | ICD-10-CM

## 2020-07-13 IMAGING — MR MR BREAST BILAT WO/W CM
8 of 12 series · 32 of 48 positions shown · IV contrast (7 ml gadavist)
Comparison: Breast MRI [DATE] (at the time of biopsy) and
[DATE]. Prior mammography and BILATERAL breast ultrasound
[DATE].

CLINICAL DATA: 34-year-old with a current history of intermittent
spontaneous milky BILATERAL nipple discharge. She underwent MRI core
needle biopsies of both breasts on [DATE], pathology
demonstrating usual ductal hyperplasia and fibrocystic changes
involving the upper central RIGHT breast and columnar cell changes,
fibrocystic changes and apically metaplasia involving the upper
inner LEFT breast. The intermittent double discharge persists, and
she complains of BILATERAL breast pain, RIGHT greater than LEFT,
since the biopsy 8 months ago.

LABS:  Not applicable.
EXAM:
BILATERAL BREAST MRI WITH AND WITHOUT CONTRAST
TECHNIQUE: Multiplanar, multisequence MR images of both breasts were obtained
prior to and following the intravenous administration of 7 ml of
Gadavist.

[Series 2: t2_tirm_tra ipat (a-p) · axial · 3.0mm · 0.70mm/px · 1 of 55 slices shown]
[im 1/55]
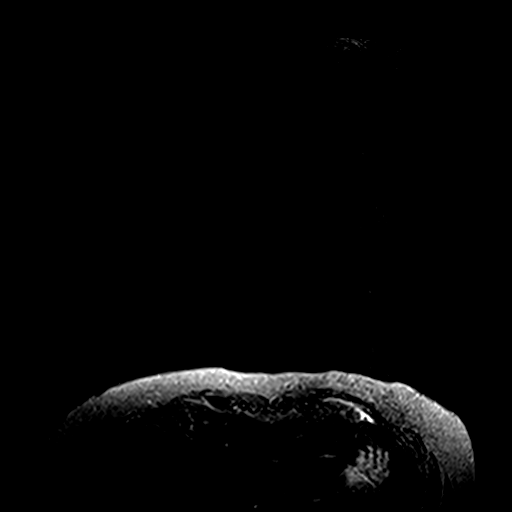

[Series 3: fl3d pre-cm no · axial · non-contrast · 1.2mm · 0.94mm/px · z∈[-86,+85]mm · 5 of 144 slices shown]
[im 1/144]
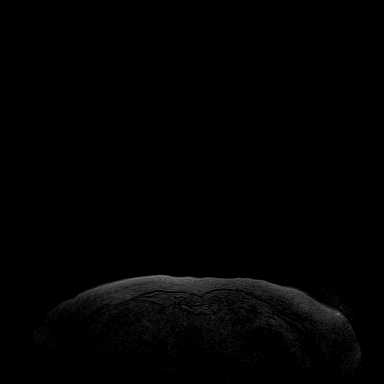
[im 36/144]
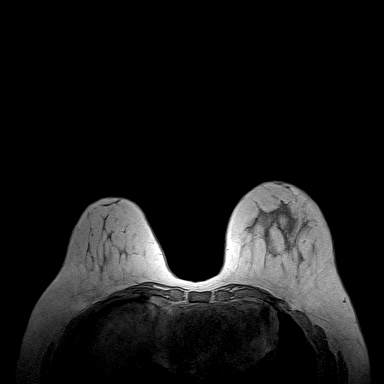
[im 72/144]
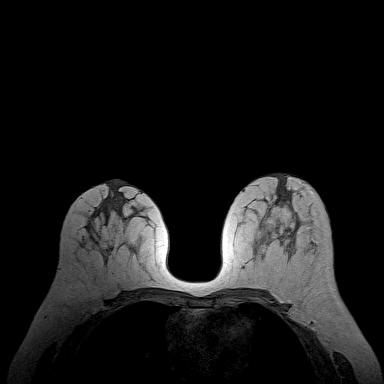
[im 108/144]
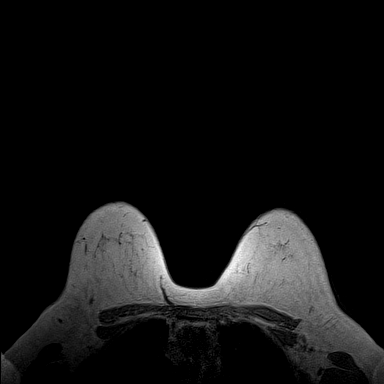
[im 144/144]
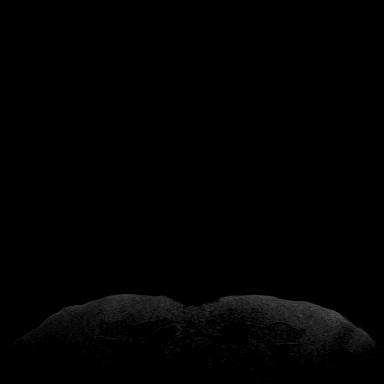

[Series 4: fl3d pre-cm · axial · non-contrast · 1.2mm · 0.94mm/px · z∈[-86,+85]mm · 5 of 144 slices shown]
[im 1/144]
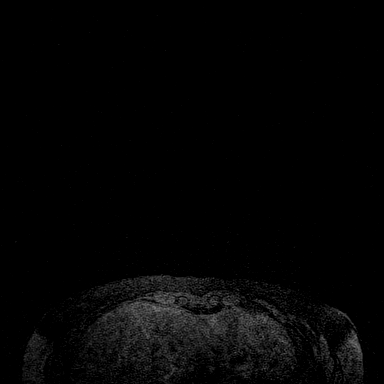
[im 36/144]
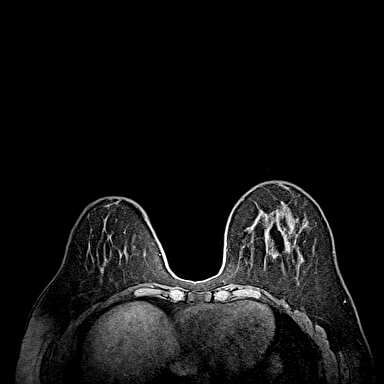
[im 72/144]
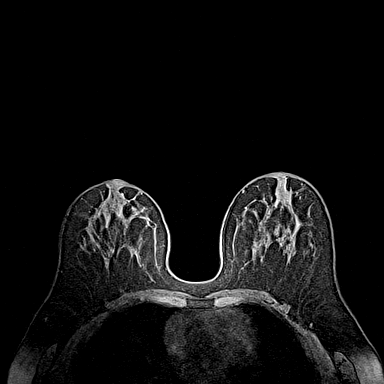
[im 108/144]
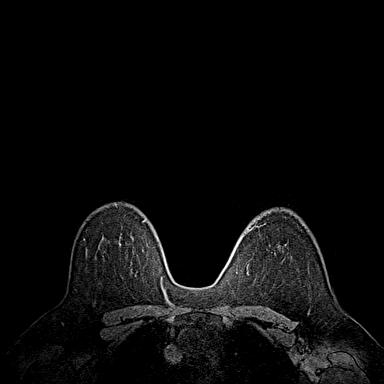
[im 144/144]
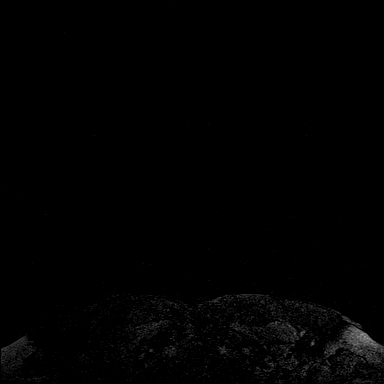

[Series 5: fl3d post-cm 20 · axial · 1.2mm · 0.94mm/px · z∈[-86,+85]mm · 5 of 144 slices shown (1 of 3)]
[im 1/144]
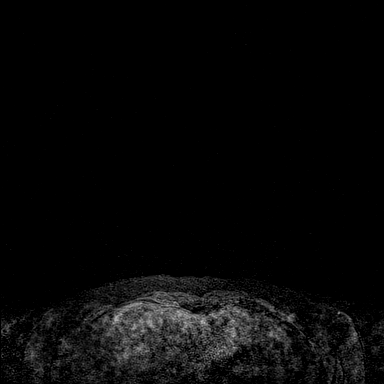
[im 36/144]
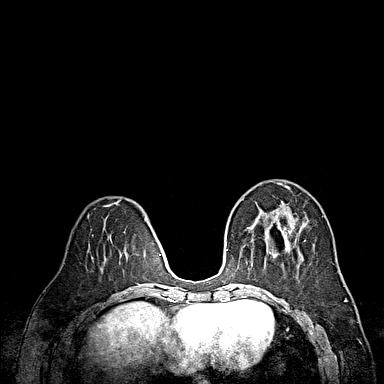
[im 72/144]
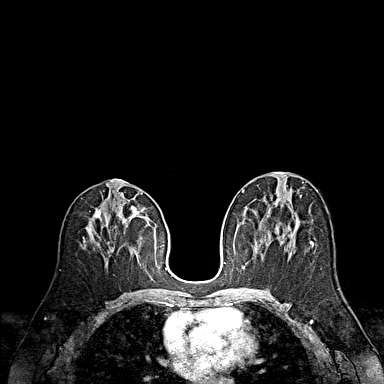
[im 108/144]
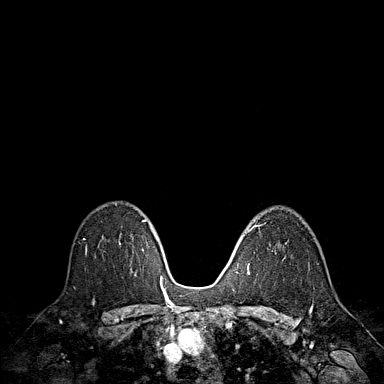
[im 144/144]
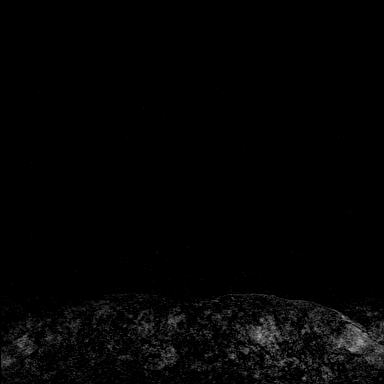

[Series 6: fl3d post-cm 20 · axial · 1.2mm · 0.94mm/px · z∈[-86,+85]mm · 5 of 144 slices shown (2 of 3)]
[im 1/144]
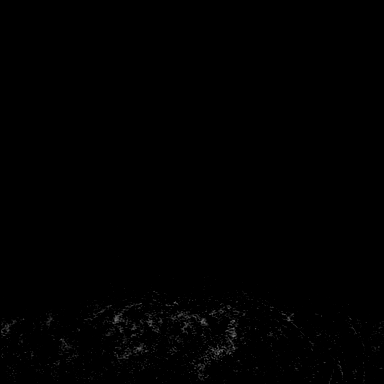
[im 36/144]
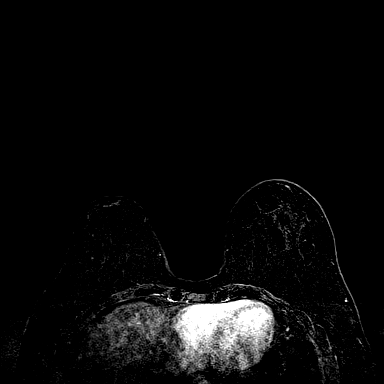
[im 72/144]
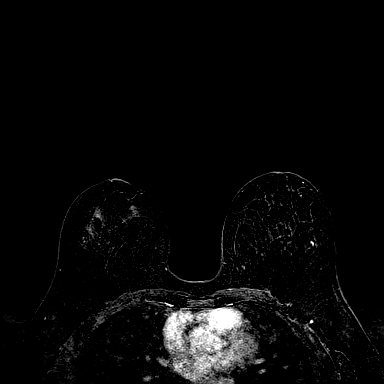
[im 108/144]
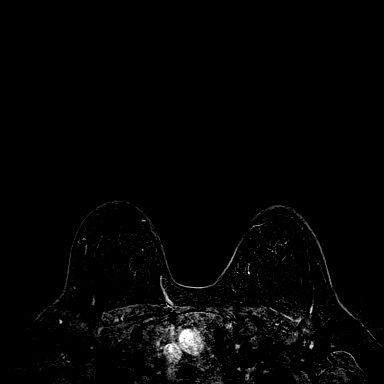
[im 144/144]
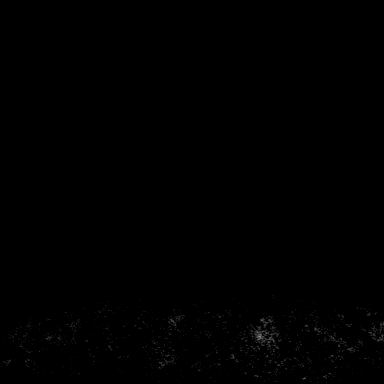

[Series 7: fl3d post-cm 20 · axial · 172.8mm · 0.94mm/px · 1 of 1 slices shown (3 of 3)]
[im 1/1]
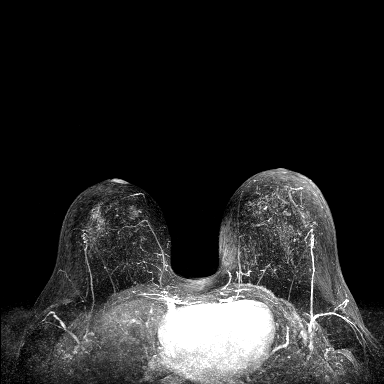

[Series 8: fl3d post-cm 3min · axial · 1.2mm · 0.94mm/px · z∈[-86,+85]mm · 6 of 144 slices shown]
[im 1/144]
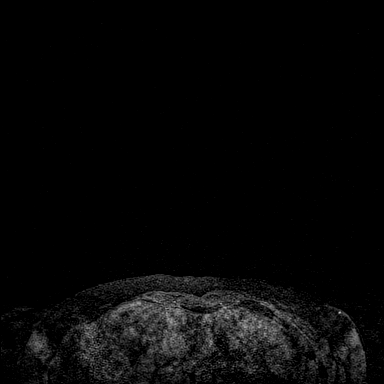
[im 29/144]
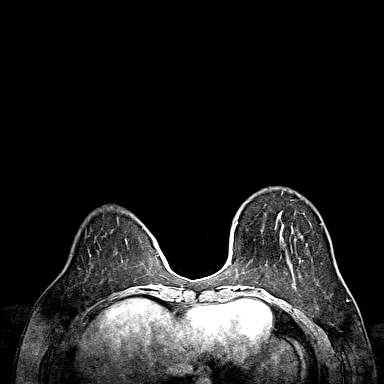
[im 58/144]
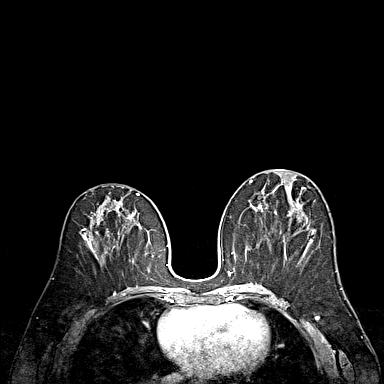
[im 86/144]
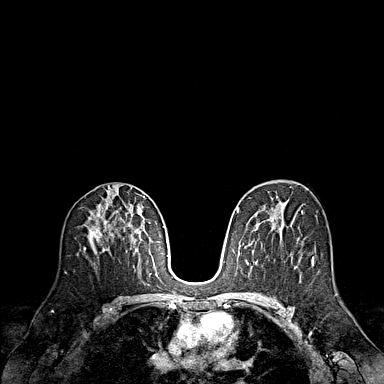
[im 115/144]
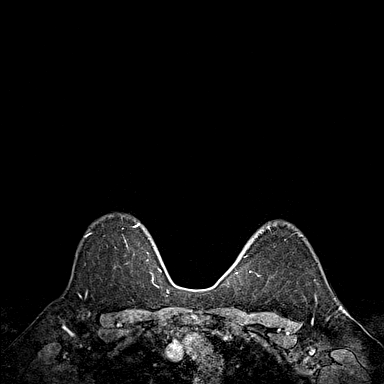
[im 144/144]
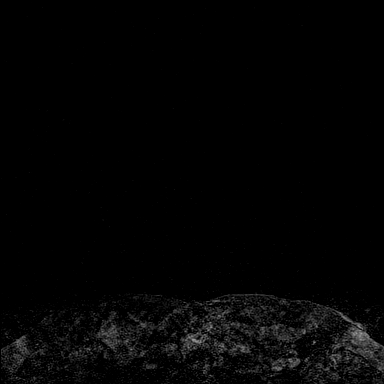

[Series 9: fl3d post-cm 3min_sub · axial · 1.2mm · 0.94mm/px · z∈[-86,+16]mm · 4 of 144 slices shown]
[im 1/144]
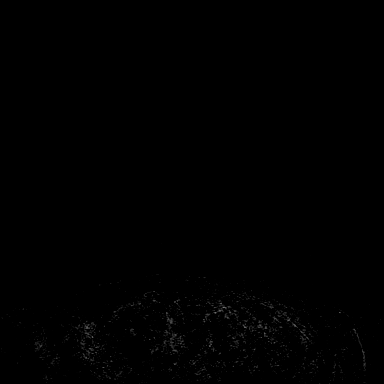
[im 29/144]
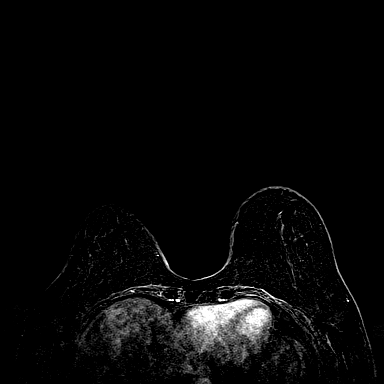
[im 58/144]
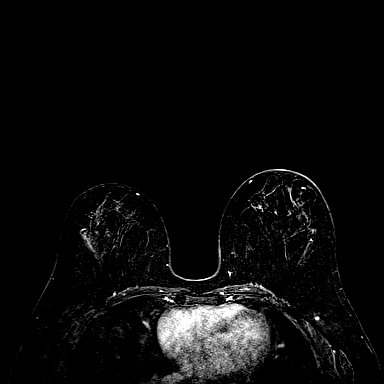
[im 86/144]
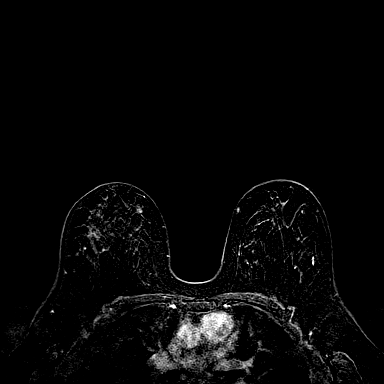

[32 of 48 positions shown; findings below may reference images not displayed]

Three-dimensional MR images were rendered by post-processing of the
original MR data on an independent workstation. The
three-dimensional MR images were interpreted, and findings are
reported in the following complete MRI report for this study. Three
dimensional images were evaluated at the independent interpreting
workstation using the [employer] thin client.
FINDINGS: Breast composition: c. Heterogeneous fibroglandular tissue.

Background parenchymal enhancement: Mild to moderate, asymmetric and
increased on the RIGHT.

Right breast: No suspicious mass or abnormal enhancement. Blooming
artifact from the previously placed tissue marker clip in the upper
central location, slight UPPER INNER QUADRANT.

Left breast: No suspicious mass or abnormal enhancement. Blooming
artifact from the previously placed tissue marker clip in the UPPER
INNER QUADRANT.

Lymph nodes: No pathologic lymphadenopathy.

Ancillary findings:  None.
IMPRESSION: No MRI evidence of malignancy involving either breast.

RECOMMENDATION:
1. The absence of imaging findings and the bilaterality of the
nipple discharge indicates that this is a benign, physiologic
discharge. If the discharge persists and is bothersome to the
patient, then surgical consultation is recommended to discuss
central duct excision.
2. Otherwise, annual screening mammography is recommended to begin
at age 40 in the absence of subsequent clinical concerns.

BI-RADS CATEGORY  1: Negative.

## 2020-07-13 MED ORDER — GADOBUTROL 1 MMOL/ML IV SOLN
7.0000 mL | Freq: Once | INTRAVENOUS | Status: AC | PRN
  Administered 2020-07-13: 7 mL via INTRAVENOUS

## 2020-08-05 ENCOUNTER — Other Ambulatory Visit: Payer: Self-pay | Admitting: Family Medicine

## 2020-08-05 DIAGNOSIS — F32A Depression, unspecified: Secondary | ICD-10-CM

## 2021-11-25 ENCOUNTER — Emergency Department (HOSPITAL_COMMUNITY): Payer: 59

## 2021-11-25 ENCOUNTER — Emergency Department (HOSPITAL_COMMUNITY)
Admission: EM | Admit: 2021-11-25 | Discharge: 2021-11-25 | Disposition: A | Discharge status: Home / Self Care | Payer: 59 | Attending: Emergency Medicine | Admitting: Emergency Medicine

## 2021-11-25 ENCOUNTER — Encounter (HOSPITAL_COMMUNITY): Payer: Self-pay | Admitting: Emergency Medicine

## 2021-11-25 DIAGNOSIS — N12 Tubulo-interstitial nephritis, not specified as acute or chronic: Secondary | ICD-10-CM | POA: Diagnosis not present

## 2021-11-25 DIAGNOSIS — D72829 Elevated white blood cell count, unspecified: Secondary | ICD-10-CM | POA: Diagnosis not present

## 2021-11-25 DIAGNOSIS — R000 Tachycardia, unspecified: Secondary | ICD-10-CM | POA: Insufficient documentation

## 2021-11-25 DIAGNOSIS — R319 Hematuria, unspecified: Secondary | ICD-10-CM | POA: Diagnosis present

## 2021-11-25 LAB — URINALYSIS, ROUTINE W REFLEX MICROSCOPIC

## 2021-11-25 LAB — COMPREHENSIVE METABOLIC PANEL
ALT: 27 U/L (ref 0–44)
AST: 18 U/L (ref 15–41)
Albumin: 4.2 g/dL (ref 3.5–5.0)
Alkaline Phosphatase: 73 U/L (ref 38–126)
Anion gap: 6 (ref 5–15)
BUN: 14 mg/dL (ref 6–20)
CO2: 23 mmol/L (ref 22–32)
Calcium: 9.1 mg/dL (ref 8.9–10.3)
Chloride: 108 mmol/L (ref 98–111)
Creatinine, Ser: 0.85 mg/dL (ref 0.44–1.00)
GFR, Estimated: >60 mL/min (ref >60)
Glucose, Bld: 98 mg/dL (ref 70–99)
Potassium: 3.6 mmol/L (ref 3.5–5.1)
Sodium: 137 mmol/L (ref 135–145)
Total Bilirubin: 0.4 mg/dL (ref 0.3–1.2)
Total Protein: 7.6 g/dL (ref 6.5–8.1)

## 2021-11-25 LAB — URINALYSIS, MICROSCOPIC (REFLEX): WBC, UA: >50 WBC/hpf (ref 0–5)

## 2021-11-25 LAB — CBC WITH DIFFERENTIAL/PLATELET
Abs Immature Granulocytes: 0.05 10*3/uL (ref 0.00–0.07)
Basophils Absolute: 0 10*3/uL (ref 0.0–0.1)
Basophils Relative: 0 %
Eosinophils Absolute: 0 10*3/uL (ref 0.0–0.5)
Eosinophils Relative: 0 %
HCT: 46.9 % — ABNORMAL HIGH (ref 36.0–46.0)
Hemoglobin: 15.9 g/dL — ABNORMAL HIGH (ref 12.0–15.0)
Immature Granulocytes: 0 %
Lymphocytes Relative: 15 %
Lymphs Abs: 1.9 10*3/uL (ref 0.7–4.0)
MCH: 30.5 pg (ref 26.0–34.0)
MCHC: 33.9 g/dL (ref 30.0–36.0)
MCV: 90 fL (ref 80.0–100.0)
Monocytes Absolute: 0.8 10*3/uL (ref 0.1–1.0)
Monocytes Relative: 6 %
Neutro Abs: 9.7 10*3/uL — ABNORMAL HIGH (ref 1.7–7.7)
Neutrophils Relative %: 79 %
Platelets: 283 10*3/uL (ref 150–400)
RBC: 5.21 MIL/uL — ABNORMAL HIGH (ref 3.87–5.11)
RDW: 12.6 % (ref 11.5–15.5)
WBC: 12.5 10*3/uL — ABNORMAL HIGH (ref 4.0–10.5)
nRBC: 0 % (ref 0.0–0.2)

## 2021-11-25 IMAGING — CT CT RENAL STONE PROTOCOL
2 of 4 series · 17 of 46 positions shown, 19 images · non-contrast
Comparison: CT abdomen and pelvis dated [DATE]

CLINICAL DATA: Left flank pain



[Series 5: coronal soft tissue · coronal · 0.83mm/px · 3 of 101 slices shown]
[im 34/101  soft-tissue]
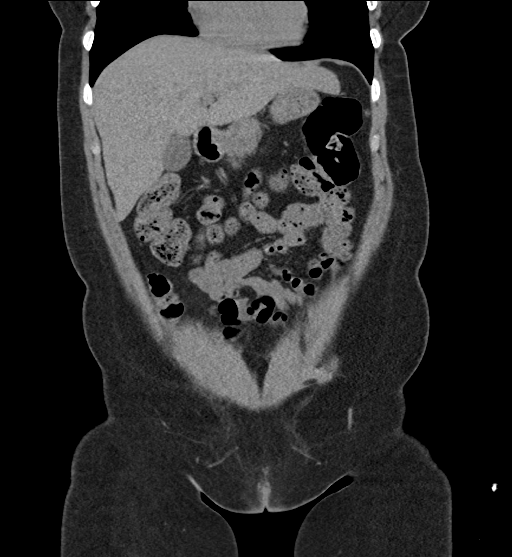
[im 45/101  soft-tissue]
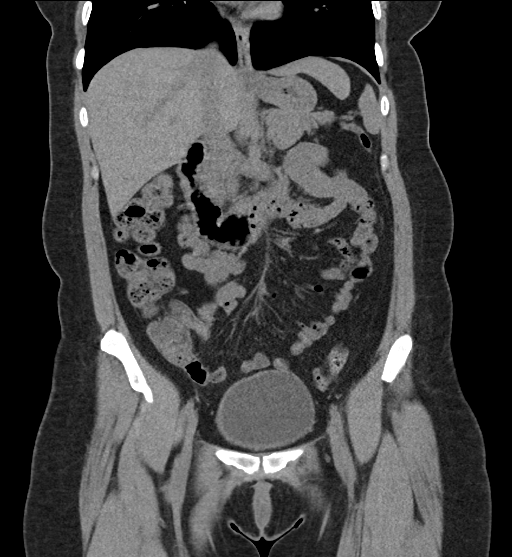
[im 56/101  soft-tissue]
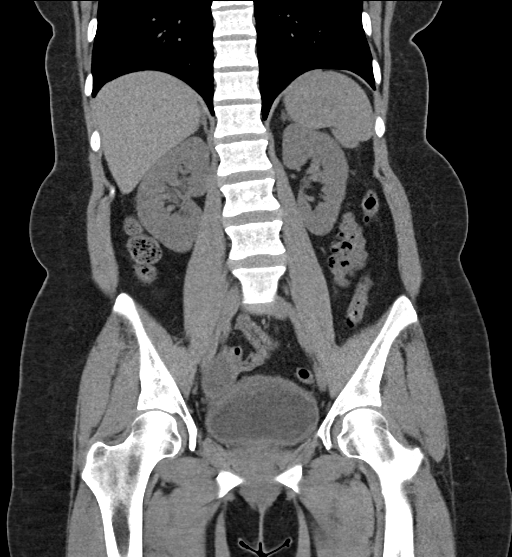

[Series 7: stone study 5.0 i30f 2 · axial · 0.84mm/px · z∈[+747,+1167]mm · 14 of 92 slices shown, 16 images]
[im 4/92  soft-tissue]
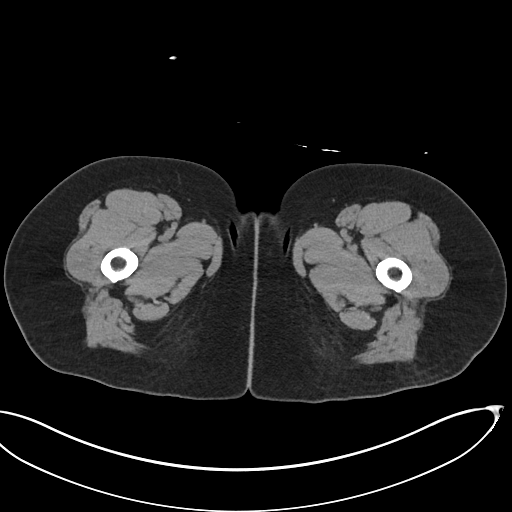
[im 4/92  bone]
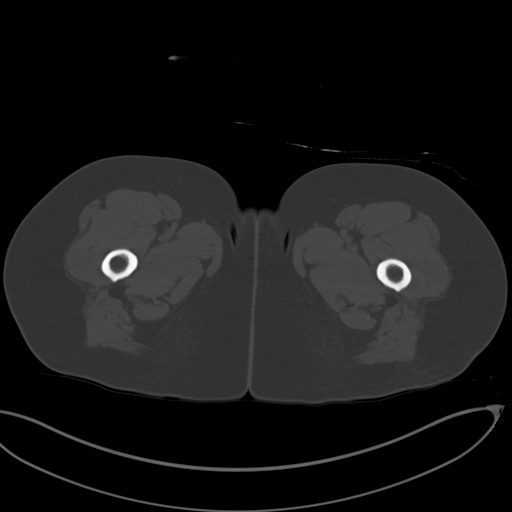
[im 11/92  soft-tissue]
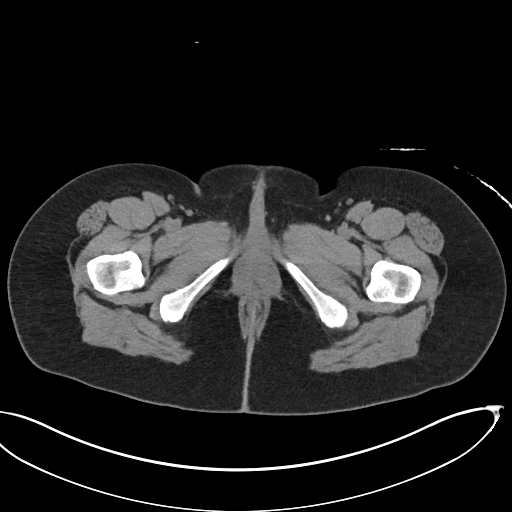
[im 19/92  soft-tissue]
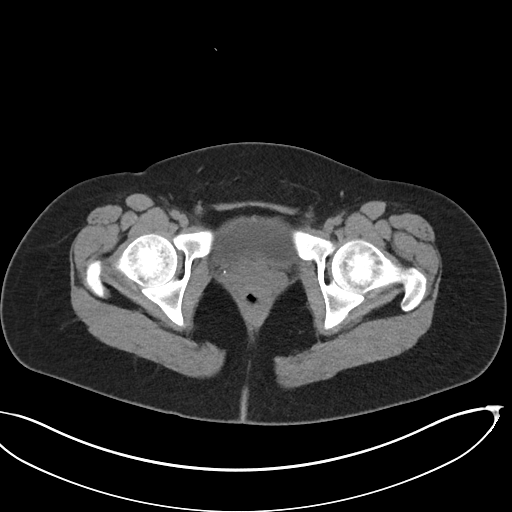
[im 26/92  soft-tissue]
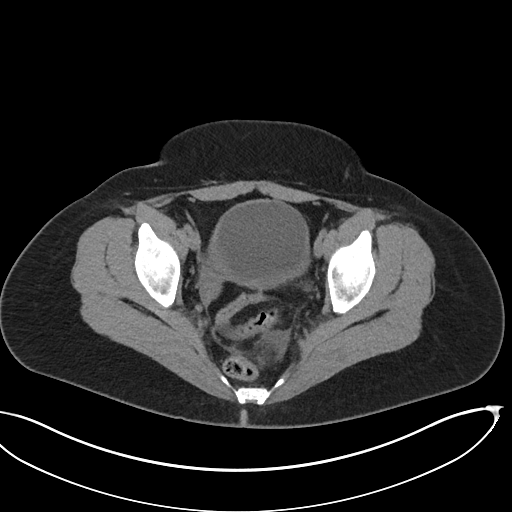
[im 30/92  soft-tissue]
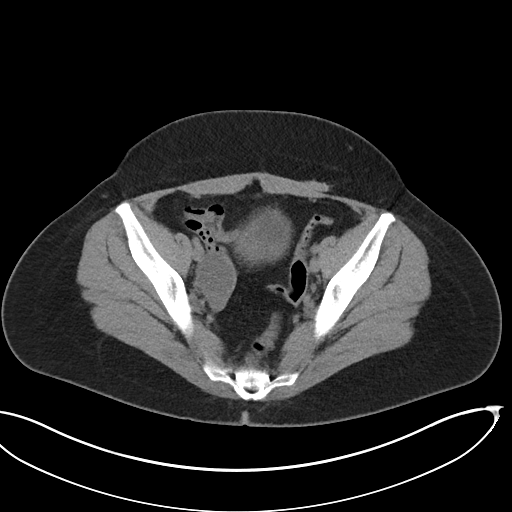
[im 37/92  soft-tissue]
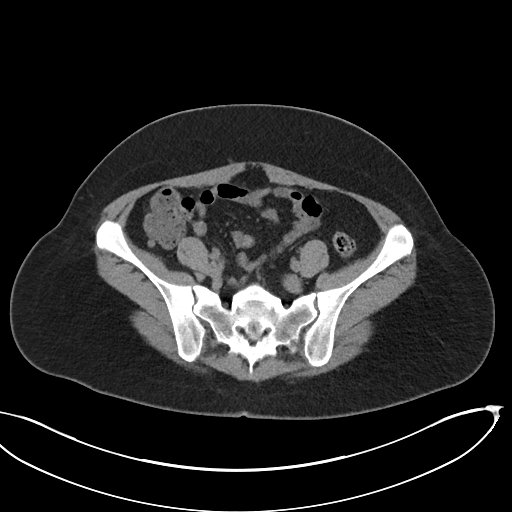
[im 44/92  soft-tissue]
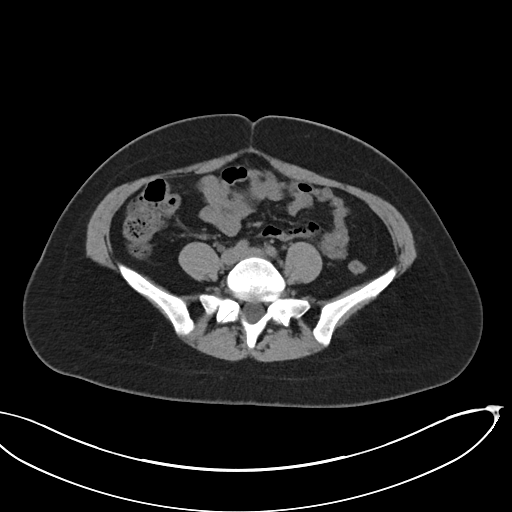
[im 48/92  soft-tissue]
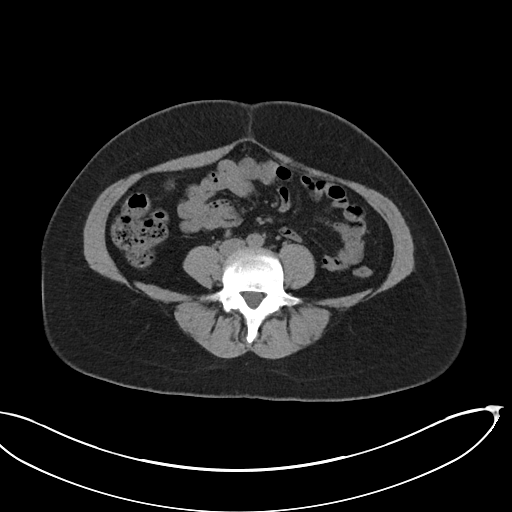
[im 55/92  soft-tissue]
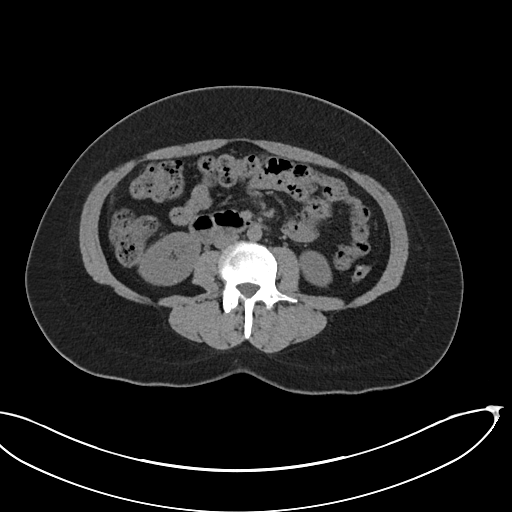
[im 55/92  bone]
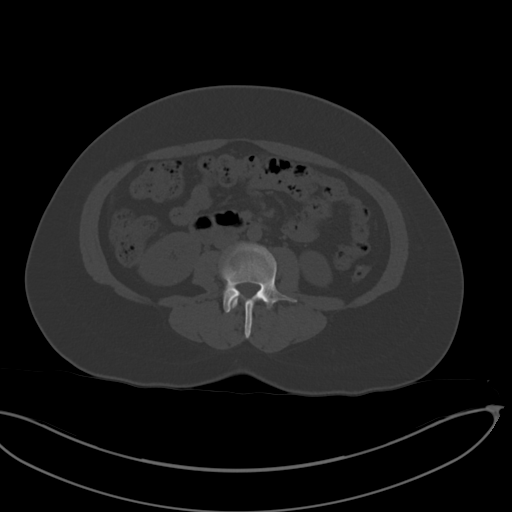
[im 62/92  soft-tissue]
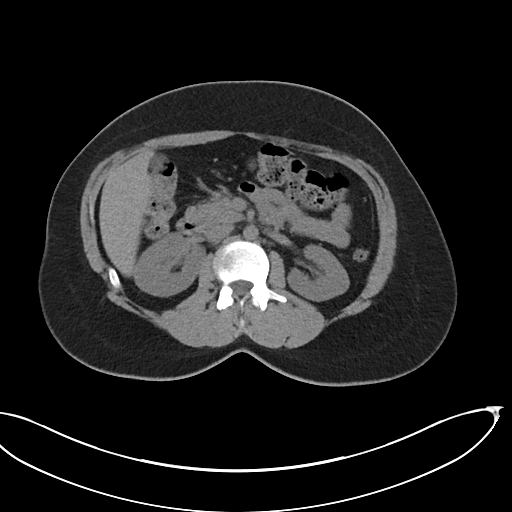
[im 70/92  soft-tissue]
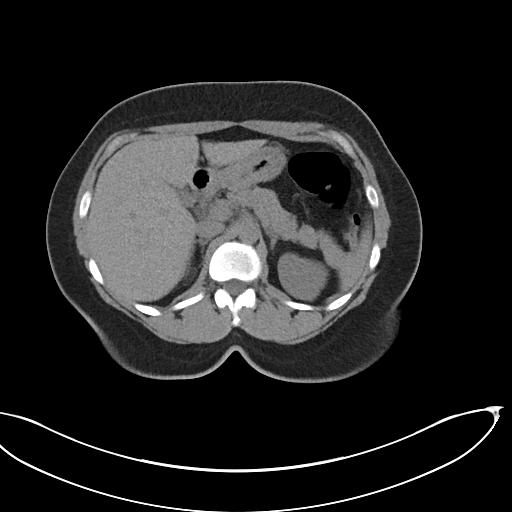
[im 73/92  soft-tissue]
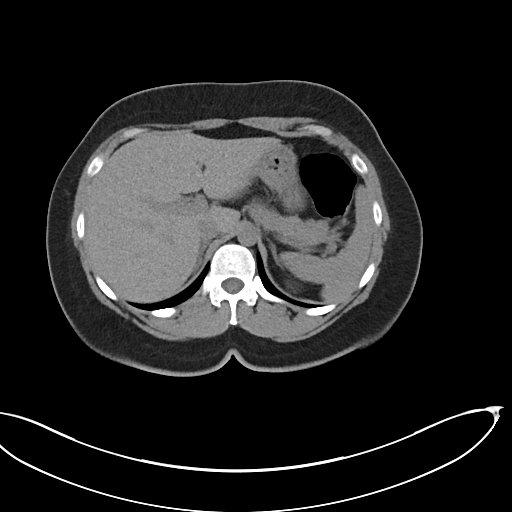
[im 81/92  soft-tissue]
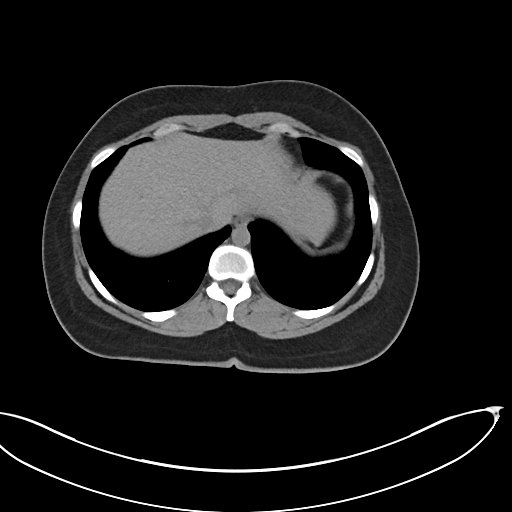
[im 88/92  soft-tissue]
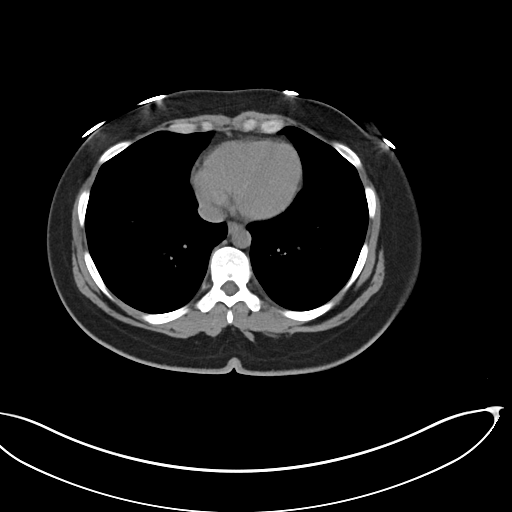

[17 of 46 positions shown; findings below may reference images not displayed]

FINDINGS: Lower chest: No acute abnormality.

Hepatobiliary: No focal liver abnormality is seen. No gallstones,
gallbladder wall thickening, or biliary dilatation.

Pancreas: Unremarkable. No pancreatic ductal dilatation or
surrounding inflammatory changes.

Spleen: Normal in size without focal abnormality.

Adrenals/Urinary Tract: Bilateral adrenal glands are unremarkable.
No hydronephrosis or nephrolithiasis. Bladder is unremarkable.

Stomach/Bowel: Stomach is within normal limits. Appendix appears
normal. No evidence of bowel wall thickening, distention, or
inflammatory changes.

Vascular/Lymphatic: No significant vascular findings are present. No
enlarged abdominal or pelvic lymph nodes.

Reproductive: Uterus is surgically absent. Cystic lesion of the
right adnexa measuring up to 4.5 cm, likely physiologic with no
further follow-up imaging recommended.

Other: No abdominal wall hernia or abnormality. No abdominopelvic
ascites.

Musculoskeletal: No acute or significant osseous findings.
IMPRESSION: No acute findings in abdomen or pelvis.

## 2021-11-25 MED ORDER — CIPROFLOXACIN HCL 500 MG PO TABS: 500.0000 mg | ORAL_TABLET | Freq: Two times a day (BID) | ORAL | 0 refills | Status: AC

## 2021-11-25 MED ORDER — ONDANSETRON HCL 4 MG/2ML IJ SOLN
4.0000 mg | Freq: Once | INTRAMUSCULAR | Status: AC
  Administered 2021-11-25: 4 mg via INTRAVENOUS
  Filled 2021-11-25: qty 2

## 2021-11-25 MED ORDER — MORPHINE SULFATE (PF) 4 MG/ML IV SOLN
4.0000 mg | Freq: Once | INTRAVENOUS | Status: AC
  Administered 2021-11-25: 4 mg via INTRAVENOUS
  Filled 2021-11-25: qty 1

## 2021-11-25 MED ORDER — SODIUM CHLORIDE 0.9 % IV SOLN
1.0000 g | Freq: Once | INTRAVENOUS | Status: AC
  Administered 2021-11-25: 1 g via INTRAVENOUS
  Filled 2021-11-25: qty 10

## 2021-11-25 MED ORDER — SODIUM CHLORIDE 0.9 % IV BOLUS
1000.0000 mL | Freq: Once | INTRAVENOUS | Status: AC
  Administered 2021-11-25: 1000 mL via INTRAVENOUS

## 2021-11-25 MED ORDER — KETOROLAC TROMETHAMINE 15 MG/ML IJ SOLN
15.0000 mg | Freq: Once | INTRAMUSCULAR | Status: AC
  Administered 2021-11-25: 15 mg via INTRAVENOUS
  Filled 2021-11-25: qty 1

## 2021-11-25 NOTE — ED Provider Notes (Signed)
?[facility] EMERGENCY DEPARTMENT ?Provider Note ? ? ?CSN: [id number] ?Arrival date & time: 11/25/21  6948 ? ?  ? ?History ? ?Chief Complaint  ?Patient presents with  ? Hematuria  ? ? ?Annette Ellison is a 36 y.o. female. ? ?36 year old female presents today for evaluation of lower abdominal pain and hematuria onset this morning.  Patient reports she had no symptoms yesterday however when she woke up this morning she had lower abdominal pain and she noticed gross hematuria upon urination.  She denies any blood on wiping and states it is only during her urine stream.  She has history of hysterectomy.  Denies any vaginal discharge.  She does have left-sided flank pain.  Denies fever, or chills.  She does report she has history of recurrent UTIs, including pyelonephritis during her pregnancy in 2011 that required admission.  She denies history of kidney stones.  She endorses nausea however no vomiting.  States she does not have similar symptoms as to when she has her UTI.  Does endorse increased urinary frequency last night. ? ?The history is provided by the patient. No language interpreter was used.  ? ?  ? ?Home Medications ?Prior to Admission medications   ?Medication Sig Start Date End Date Taking? Authorizing Provider  ?sertraline (ZOLOFT) 25 MG tablet TAKE 1 TABLET(25 MG) BY MOUTH DAILY 08/05/20   Melene Plan, MD  ?tiZANidine (ZANAFLEX) 4 MG tablet Take 1 tablet (4 mg total) by mouth every 6 (six) hours as needed for muscle spasms. 10/11/18   Howard Pouch, MD  ?topiramate (TOPAMAX) 100 MG tablet Take 2 tablets (200 mg total) by mouth every evening. 05/02/18   Howard Pouch, MD  ?   ? ?Allergies    ?Penicillins, Pineapple, Other, and Vicodin [hydrocodone-acetaminophen]   ? ?Review of Systems   ?Review of Systems  ?Constitutional:  Negative for chills and fever.  ?Gastrointestinal:  Positive for abdominal pain and nausea. Negative for vomiting.  ?Genitourinary:  Positive for flank pain, frequency  and hematuria. Negative for dysuria, vaginal bleeding, vaginal discharge and vaginal pain.  ?All other systems reviewed and are negative. ? ?Physical Exam ?Updated Vital Signs ?BP (!) 117/92 (BP Location: Right Arm)   Pulse (!) 109   Temp 98.8 ?F (37.1 ?C) (Oral)   Resp 16   SpO2 100%  ?Physical Exam ?Vitals and nursing note reviewed.  ?Constitutional:   ?   General: She is not in acute distress. ?   Appearance: Normal appearance. She is not ill-appearing.  ?HENT:  ?   Head: Normocephalic and atraumatic.  ?   Nose: Nose normal.  ?Eyes:  ?   General: No scleral icterus. ?   Extraocular Movements: Extraocular movements intact.  ?   Conjunctiva/sclera: Conjunctivae normal.  ?Cardiovascular:  ?   Rate and Rhythm: Regular rhythm. Tachycardia present.  ?   Pulses: Normal pulses.  ?Pulmonary:  ?   Effort: Pulmonary effort is normal. No respiratory distress.  ?   Breath sounds: Normal breath sounds. No wheezing or rales.  ?Abdominal:  ?   General: There is no distension.  ?   Tenderness: There is abdominal tenderness (Suprapubic, left lower quadrant). There is left CVA tenderness. There is no right CVA tenderness, guarding or rebound.  ?Musculoskeletal:     ?   General: Normal range of motion.  ?   Cervical back: Normal range of motion.  ?Skin: ?   General: Skin is warm and dry.  ?Neurological:  ?   General:  No focal deficit present.  ?   Mental Status: She is alert. Mental status is at baseline.  ? ? ?ED Results / Procedures / Treatments   ?Labs ?(all labs ordered are listed, but only abnormal results are displayed) ?Labs Reviewed  ?CBC WITH DIFFERENTIAL/PLATELET - Abnormal; Notable for the following components:  ?    Result Value  ? WBC 12.5 (*)   ? RBC 5.21 (*)   ? Hemoglobin 15.9 (*)   ? HCT 46.9 (*)   ? Neutro Abs 9.7 (*)   ? All other components within normal limits  ?URINALYSIS, ROUTINE W REFLEX MICROSCOPIC - Abnormal; Notable for the following components:  ? Color, Urine RED (*)   ? APPearance BLOODY (*)   ?  Glucose, UA   (*)   ? Value: TEST NOT REPORTED DUE TO COLOR INTERFERENCE OF URINE PIGMENT  ? Hgb urine dipstick   (*)   ? Value: TEST NOT REPORTED DUE TO COLOR INTERFERENCE OF URINE PIGMENT  ? Bilirubin Urine   (*)   ? Value: TEST NOT REPORTED DUE TO COLOR INTERFERENCE OF URINE PIGMENT  ? Ketones, ur   (*)   ? Value: TEST NOT REPORTED DUE TO COLOR INTERFERENCE OF URINE PIGMENT  ? Protein, ur   (*)   ? Value: TEST NOT REPORTED DUE TO COLOR INTERFERENCE OF URINE PIGMENT  ? Nitrite   (*)   ? Value: TEST NOT REPORTED DUE TO COLOR INTERFERENCE OF URINE PIGMENT  ? Leukocytes,Ua   (*)   ? Value: TEST NOT REPORTED DUE TO COLOR INTERFERENCE OF URINE PIGMENT  ? All other components within normal limits  ?URINALYSIS, MICROSCOPIC (REFLEX) - Abnormal; Notable for the following components:  ? Bacteria, UA MANY (*)   ? Non Squamous Epithelial PRESENT (*)   ? All other components within normal limits  ?URINE CULTURE  ?COMPREHENSIVE METABOLIC PANEL  ? ? ?EKG ?None ? ?Radiology ?No results found. ? ?Procedures ?Procedures  ? ? ?Medications Ordered in ED ?Medications  ?sodium chloride 0.9 % bolus 1,000 mL (has no administration in time range)  ?morphine (PF) 4 MG/ML injection 4 mg (has no administration in time range)  ?ondansetron (ZOFRAN) injection 4 mg (has no administration in time range)  ? ? ?ED Course/ Medical Decision Making/ A&P ?Clinical Course as of 11/25/21 1503  ?Tue Nov 25, 2021  ?1338 CT Renal Stone Study [AA]  ?  ?Clinical Course User Index ?Verne Grain[AA] Bow Buntyn, PA-C  ? ?                        ?Medical Decision Making ?Amount and/or Complexity of Data Reviewed ?Radiology:  Decision-making details documented in ED Course. ? ?Risk ?Prescription drug management. ? ? ?36 year old female presents today for evaluation of hematuria.  This is associated with lower abdominal pain and left flank pain, and nausea.  Denies history of kidney stones.  Does have history of recurrent UTIs.  Symptoms are not consistent with her previous  UTIs although she does have increased urinary frequency since last night.  She has history of hysterectomy.  Denies vaginal discharge, vaginal bleeding.  Work-up so far significant for mild leukocytosis, CMP which is unremarkable.  UA given gross hematuria does not yield much information.  Urinalysis microscopic reflex does show greater than 50 white blood cells.  CT renal stone study pending.  Provide pain medication, Zofran, IV hydration. ? ?CT renal stone study negative for stone or other acute findings.  Patient's work-up  including mild leukocytosis at 12.5, UA with greater than 50 white blood cells consistent with pyelonephritis.  Renal function preserved.  CMP without other acute findings.  Urine culture pending.  Will give dose of Rocephin in the emergency room.  Patient is without acute distress.  Patient is appropriate for discharge.  Discharged in stable condition.  Return precautions discussed.  P.o. antibiotic sent to patient's choice of pharmacy.  Patient voices understanding and is in agreement with plan. ? ?Final Clinical Impression(s) / ED Diagnoses ?Final diagnoses:  ?Pyelonephritis  ? ? ?Rx / DC Orders ?ED Discharge Orders   ? ?      Ordered  ?  ciprofloxacin (CIPRO) 500 MG tablet  Every 12 hours       ? 11/25/21 1506  ? ?  ?  ? ?  ? ? ?  ?Marita Kansas, PA-C ?11/25/21 1523 ? ?  ?Margarita Grizzle, MD ?11/28/21 1038 ? ?

## 2021-11-25 NOTE — Discharge Instructions (Addendum)
Your work-up today showed that she has pyelonephritis or upper urinary tract infection.  You received a dose of antibiotics in the emergency room.  Additional antibiotics have been sent to your pharmacy.  If you have any worsening symptoms you can return to the emergency room otherwise follow-up with your primary care provider. ?

## 2021-11-25 NOTE — ED Triage Notes (Signed)
Patient complains of hematuria and lower abdominal pain that started earlier this morning. Patient denies history of kidney stones. Patient is alert, oriented, and in no apparent distress at this time. ?

## 2021-11-25 NOTE — ED Notes (Signed)
Patient verbalizes understanding of discharge instructions. Opportunity for questioning and answers were provided. Armband removed by staff, pt discharged from ED.  

## 2021-11-25 NOTE — ED Provider Triage Note (Signed)
Emergency Medicine Provider Triage Evaluation Note ? ?Annette Ellison , a 36 y.o. female  was evaluated in triage.  Pt complains of dysuria, hematuria, and left flank pain.  Patient reports that her symptoms started today.  Patient rates flank pain 8/10 on the pain scale.  Patient denies any blood thinner use.  Patient has history of abdominal hysterectomy. ? ?Review of Systems  ?Positive: Dysuria, hematuria, left flank pain ?Negative: Fever, chills, vaginal pain, vaginal bleeding, vaginal discharge, urinary urgency, urinary frequency, nausea, vomiting ? ?Physical Exam  ?There were no vitals taken for this visit. ?Gen:   Awake, no distress   ?Resp:  Normal effort  ?MSK:   Moves extremities without difficulty  ?Other:  Abdomen soft, nondistended, nontender with no guarding or rebound tenderness.  Minimal CVA tenderness bilaterally. ? ?Medical Decision Making  ?Medically screening exam initiated at 9:36 AM.  Appropriate orders placed.  Annette Ellison was informed that the remainder of the evaluation will be completed by another provider, this initial triage assessment does not replace that evaluation, and the importance of remaining in the ED until their evaluation is complete. ? ?We will obtain CMP, CBC, urinalysis and CT renal study. ?  ?Loni Beckwith, PA-C ?11/25/21 [id number] ? ?

## 2021-11-26 LAB — URINE CULTURE: Culture: <10000 — AB

## 2021-12-26 ENCOUNTER — Other Ambulatory Visit: Payer: Self-pay | Admitting: Ophthalmology

## 2022-01-13 ENCOUNTER — Other Ambulatory Visit: Payer: Self-pay | Admitting: *Deleted

## 2022-01-13 DIAGNOSIS — R42 Dizziness and giddiness: Secondary | ICD-10-CM

## 2022-01-31 ENCOUNTER — Other Ambulatory Visit: Payer: 59

## 2022-02-01 ENCOUNTER — Ambulatory Visit
Admission: RE | Admit: 2022-02-01 | Discharge: 2022-02-01 | Disposition: A | Discharge status: Home / Self Care | Payer: 59 | Source: Ambulatory Visit | Attending: *Deleted | Admitting: *Deleted

## 2022-02-01 DIAGNOSIS — R42 Dizziness and giddiness: Secondary | ICD-10-CM

## 2022-02-01 IMAGING — MR MR HEAD WO/W CM
14 series · 48 of 48 positions shown · IV contrast (15 ml multihance)
Comparison: No pertinent prior exams available for comparison.

CLINICAL DATA: Provided history: Dizziness and giddiness.
Additional history provided by scanning technologist: Patient
reports dizziness for 1-2 months, migraines, nausea, difficulty
walking.

EXAM:
MRI HEAD WITHOUT AND WITH CONTRAST
TECHNIQUE: Multiplanar, multiecho pulse sequences of the brain and surrounding
structures were obtained without and with intravenous contrast.
CONTRAST:  15mL MULTIHANCE GADOBENATE DIMEGLUMINE 529 MG/ML IV SOLN

[Series 5: T1 · sagittal · 4.0mm · 0.75mm/px · 1 of 31 slices shown (1 of 3)]
[im 1/31]
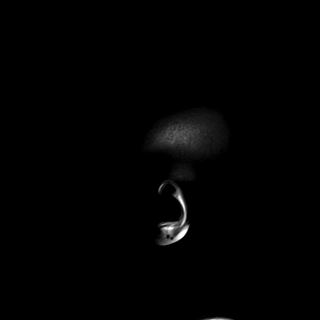

[Series 6: DWI · axial · 3.0mm · 0.94mm/px · z∈[-57,+94]mm · 8 of 172 slices shown (1 of 3)]
[im 1/172]
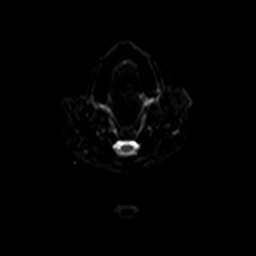
[im 25/172]
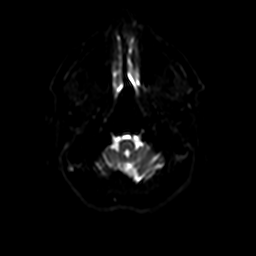
[im 49/172]
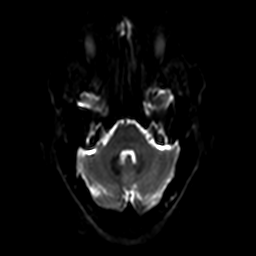
[im 74/172]
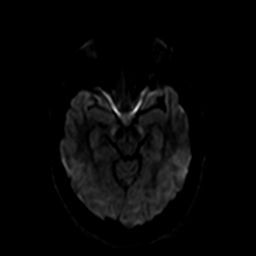
[im 98/172]
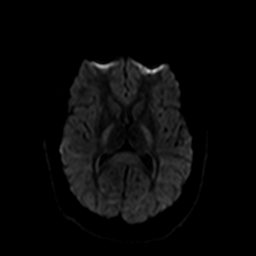
[im 123/172]
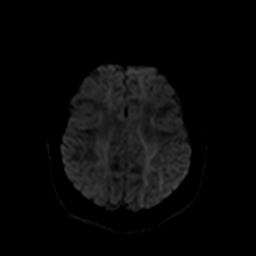
[im 147/172]
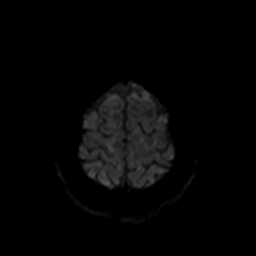
[im 172/172]
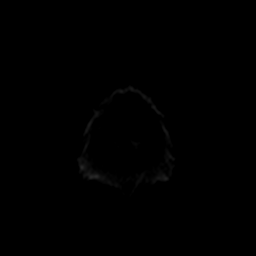

[Series 7: ax dwi_tracew · axial · 3.0mm · 0.94mm/px · z∈[-57,+94]mm · 4 of 86 slices shown]
[im 1/86]
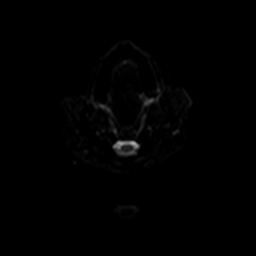
[im 29/86]
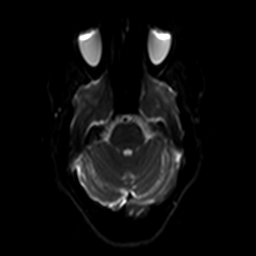
[im 57/86]
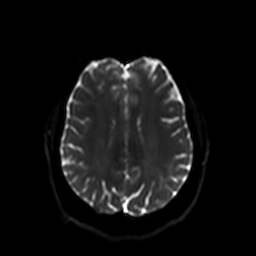
[im 86/86]
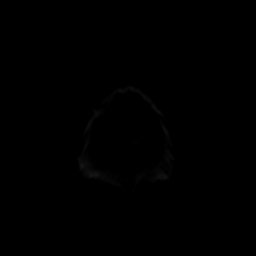

[Series 8: ax dwi_adc · axial · 3.0mm · 0.94mm/px · z∈[-57,+94]mm · 2 of 43 slices shown]
[im 1/43]
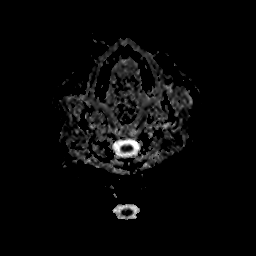
[im 43/43]
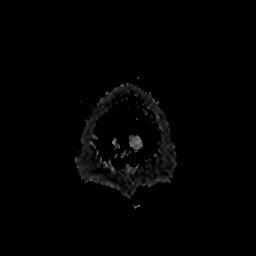

[Series 9: DWI · coronal · 5.0mm · 1.44mm/px · 3 of 68 slices shown (2 of 3)]
[im 1/68]
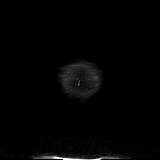
[im 34/68]
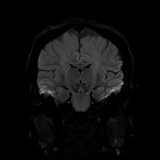
[im 68/68]
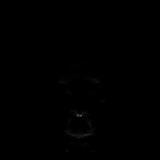

[Series 10: DWI · coronal · 5.0mm · 1.44mm/px · 2 of 34 slices shown (3 of 3)]
[im 1/34]
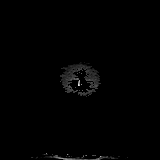
[im 34/34]
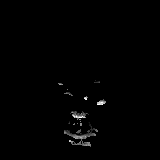

[Series 11: T2 · axial · 4.0mm · 0.36mm/px · 1 of 30 slices shown]
[im 1/30]
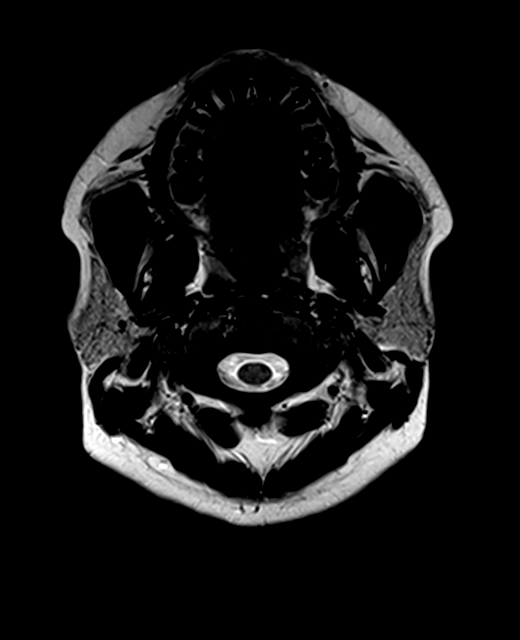

[Series 12: FLAIR · axial · 3.0mm · 0.72mm/px · 1 of 26 slices shown]
[im 1/26]
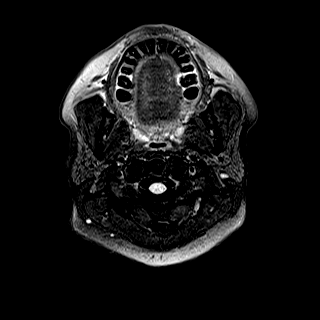

[Series 14: swi_images · axial · 1.5mm · 0.90mm/px · z∈[-59,+95]mm · 5 of 104 slices shown]
[im 1/104]
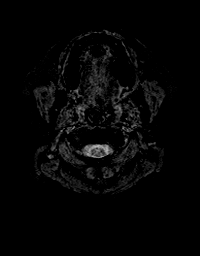
[im 26/104]
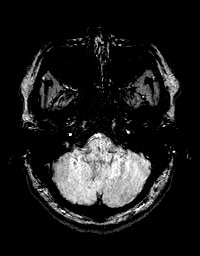
[im 52/104]
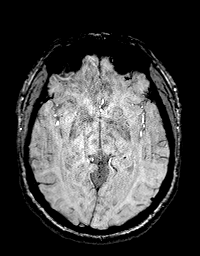
[im 78/104]
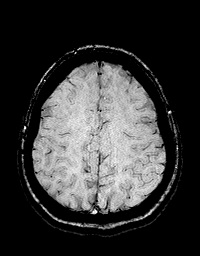
[im 104/104]
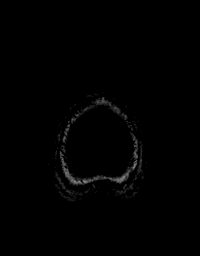

[Series 15: T1 · axial · 1.0mm · 0.94mm/px · z∈[-61,+98]mm · 8 of 160 slices shown (2 of 3)]
[im 1/160]
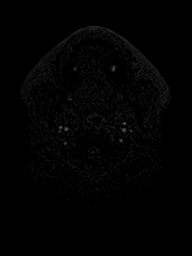
[im 23/160]
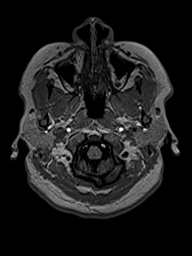
[im 46/160]
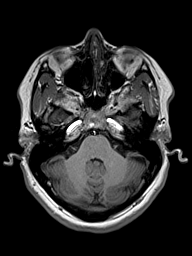
[im 69/160]
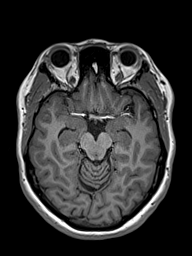
[im 91/160]
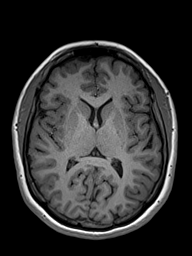
[im 114/160]
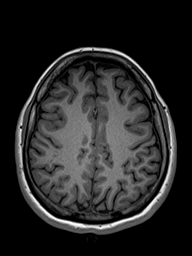
[im 137/160]
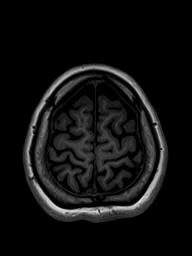
[im 160/160]
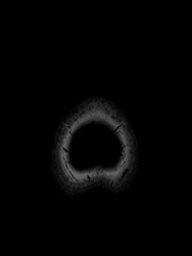

[Series 16: T2 post-contrast · coronal · 4.0mm · 0.36mm/px · 2 of 35 slices shown]
[im 1/35]
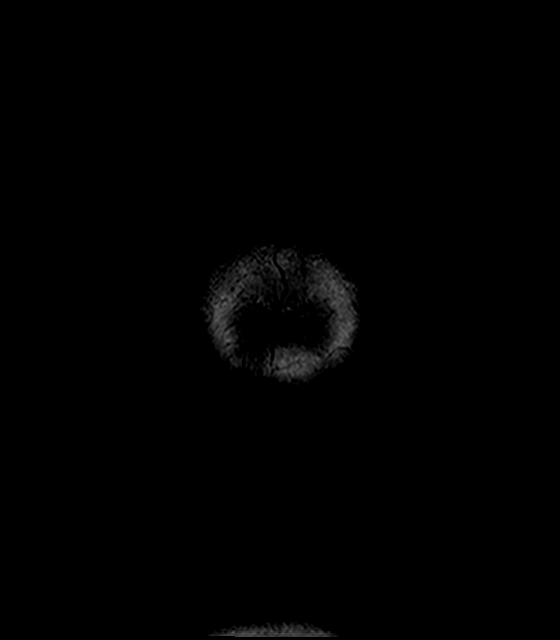
[im 35/35]
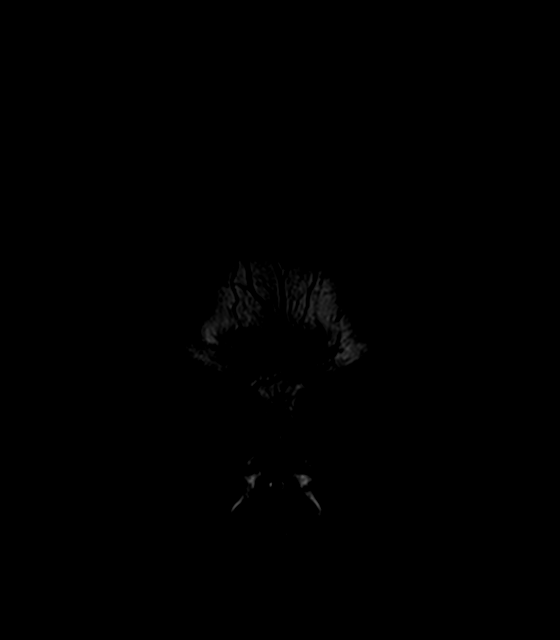

[Series 17: T1 · axial · 1.0mm · 0.94mm/px · z∈[-61,+98]mm · 8 of 160 slices shown (3 of 3)]
[im 1/160]
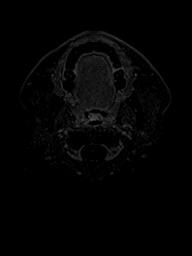
[im 23/160]
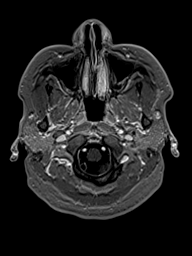
[im 46/160]
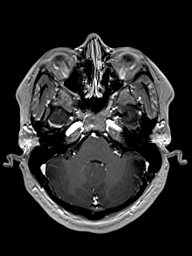
[im 69/160]
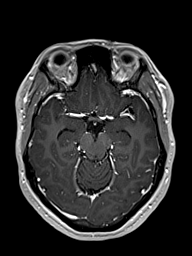
[im 91/160]
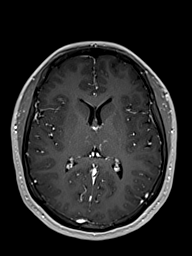
[im 114/160]
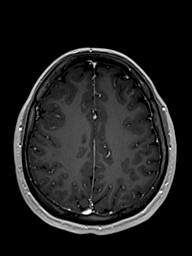
[im 137/160]
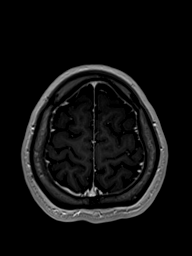
[im 160/160]
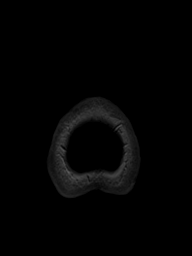

[Series 18: T1 post-contrast · coronal · 4.0mm · 0.72mm/px · 2 of 35 slices shown (1 of 2)]
[im 1/35]
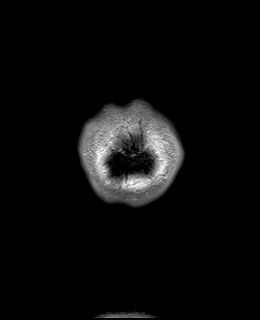
[im 35/35]
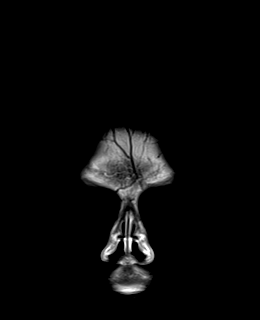

[Series 19: T1 post-contrast · sagittal · 4.0mm · 0.75mm/px · 1 of 31 slices shown (2 of 2)]
[im 1/31]
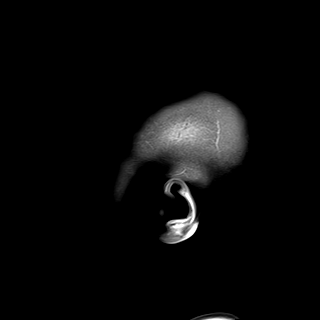

[48 of 48 positions shown; findings below may reference images not displayed]

FINDINGS: Brain:

Cerebral volume is normal.

4 mm pineal cyst.

Multifocal T2 FLAIR hyperintense signal abnormality within the
cerebral white matter, overall mild but unexpected in a patient of
this age. These signal changes have a subcortical predominance.

There is no acute infarct.

No evidence of an intracranial mass.

No chronic intracranial blood products.

No extra-axial fluid collection.

No midline shift.

No pathologic intracranial enhancement identified.

Vascular: Maintained flow voids within the proximal large arterial
vessels.

Skull and upper cervical spine: No focal suspicious marrow lesion.

Sinuses/Orbits: No mass or acute finding within the imaged orbits.
Trace mucosal thickening within the bilateral ethmoid sinuses.
IMPRESSION: 1. No evidence of acute intracranial abnormality.
2. Multifocal T2 FLAIR hyperintense signal abnormality within the
cerebral white matter, overall mild but unexpected in a patient of
this age. These signal changes are nonspecific and differential
considerations include age advanced chronic small vessel ischemic
disease, sequela of chronic migraine headaches, sequela of a prior
infectious/inflammatory process or less likely (given the
distribution) sequela of a demyelinating process (such as multiple
sclerosis), among others.
3. 4 mm pineal cyst.

## 2022-02-01 MED ORDER — GADOBENATE DIMEGLUMINE 529 MG/ML IV SOLN
15.0000 mL | Freq: Once | INTRAVENOUS | Status: AC | PRN
  Administered 2022-02-01: 15 mL via INTRAVENOUS

## 2022-04-07 DIAGNOSIS — Z0289 Encounter for other administrative examinations: Secondary | ICD-10-CM

## 2022-04-27 ENCOUNTER — Encounter (INDEPENDENT_AMBULATORY_CARE_PROVIDER_SITE_OTHER): Payer: Self-pay | Admitting: Family Medicine

## 2022-04-27 ENCOUNTER — Ambulatory Visit (INDEPENDENT_AMBULATORY_CARE_PROVIDER_SITE_OTHER): Payer: 59 | Admitting: Family Medicine

## 2022-04-27 VITALS — BP 101/65 | HR 85 | Temp 98.3°F | Ht 62.0 in | Wt 175.0 lb

## 2022-04-27 DIAGNOSIS — R5383 Other fatigue: Secondary | ICD-10-CM | POA: Diagnosis not present

## 2022-04-27 DIAGNOSIS — R0602 Shortness of breath: Secondary | ICD-10-CM | POA: Diagnosis not present

## 2022-04-27 DIAGNOSIS — F411 Generalized anxiety disorder: Secondary | ICD-10-CM

## 2022-04-27 DIAGNOSIS — F3289 Other specified depressive episodes: Secondary | ICD-10-CM | POA: Diagnosis not present

## 2022-04-27 DIAGNOSIS — F32A Depression, unspecified: Secondary | ICD-10-CM | POA: Insufficient documentation

## 2022-04-27 DIAGNOSIS — E669 Obesity, unspecified: Secondary | ICD-10-CM

## 2022-04-27 DIAGNOSIS — G4730 Sleep apnea, unspecified: Secondary | ICD-10-CM | POA: Diagnosis not present

## 2022-04-27 DIAGNOSIS — Z6832 Body mass index (BMI) 32.0-32.9, adult: Secondary | ICD-10-CM

## 2022-04-28 LAB — CBC WITH DIFFERENTIAL/PLATELET
Basophils Absolute: 0.1 10*3/uL (ref 0.0–0.2)
Basos: 1 %
EOS (ABSOLUTE): 0.1 10*3/uL (ref 0.0–0.4)
Eos: 1 %
Hematocrit: 45.6 % (ref 34.0–46.6)
Hemoglobin: 14.8 g/dL (ref 11.1–15.9)
Immature Grans (Abs): 0 10*3/uL (ref 0.0–0.1)
Immature Granulocytes: 0 %
Lymphocytes Absolute: 1.5 10*3/uL (ref 0.7–3.1)
Lymphs: 19 %
MCH: 29.8 pg (ref 26.6–33.0)
MCHC: 32.5 g/dL (ref 31.5–35.7)
MCV: 92 fL (ref 79–97)
Monocytes Absolute: 0.6 10*3/uL (ref 0.1–0.9)
Monocytes: 8 %
Neutrophils Absolute: 5.9 10*3/uL (ref 1.4–7.0)
Neutrophils: 71 %
Platelets: 272 10*3/uL (ref 150–450)
RBC: 4.97 x10E6/uL (ref 3.77–5.28)
RDW: 12.4 % (ref 11.7–15.4)
WBC: 8.2 10*3/uL (ref 3.4–10.8)

## 2022-04-28 LAB — TSH: TSH: 0.877 u[IU]/mL (ref 0.450–4.500)

## 2022-04-28 LAB — LIPID PANEL
Chol/HDL Ratio: 3.3 ratio (ref 0.0–4.4)
Cholesterol, Total: 220 mg/dL — ABNORMAL HIGH (ref 100–199)
HDL: 66 mg/dL (ref >39)
LDL Chol Calc (NIH): 134 mg/dL — ABNORMAL HIGH (ref 0–99)
Triglycerides: 112 mg/dL (ref 0–149)
VLDL Cholesterol Cal: 20 mg/dL (ref 5–40)

## 2022-04-28 LAB — VITAMIN B12: Vitamin B-12: 350 pg/mL (ref 232–1245)

## 2022-04-28 LAB — VITAMIN D 25 HYDROXY (VIT D DEFICIENCY, FRACTURES): Vit D, 25-Hydroxy: 14.6 ng/mL — ABNORMAL LOW (ref 30.0–100.0)

## 2022-04-28 LAB — INSULIN, RANDOM: INSULIN: 25.7 u[IU]/mL — ABNORMAL HIGH (ref 2.6–24.9)

## 2022-04-28 LAB — HEMOGLOBIN A1C
Est. average glucose Bld gHb Est-mCnc: 108 mg/dL
Hgb A1c MFr Bld: 5.4 % (ref 4.8–5.6)

## 2022-04-28 LAB — FOLATE: Folate: 6.8 ng/mL (ref >3.0)

## 2022-04-28 LAB — T4, FREE: Free T4: 0.95 ng/dL (ref 0.82–1.77)

## 2022-04-30 NOTE — Progress Notes (Unsigned)
Chief Complaint:   OBESITY Annette Ellison (MR# [id number]) is a 36 y.o. female who presents for evaluation and treatment of obesity and related comorbidities. Current BMI is Body mass index is 32.01 kg/m. Annette Ellison has been struggling with her weight for many years and has been unsuccessful in either losing weight, maintaining weight loss, or reaching her healthy weight goal.  Annette Ellison gained 50 pounds in 1 year with losses in her family.  She works a sedentary job.  Track steps.  Denies overeating at meals and denies over snacking.  She denies frequent meals out.  Has a good support system.  She cooks but has increased sweet and sugary drinks.  Never used AOM.  Had a TAH with 1 ovary removed and 2012.  Overweight in early childhood.  Annette Ellison is currently in the action stage of change and ready to dedicate time achieving and maintaining a healthier weight. Annette Ellison is interested in becoming our patient and working on intensive lifestyle modifications including (but not limited to) diet and exercise for weight loss.  Annette Ellison's habits were reviewed today and are as follows: Her family eats meals together, she thinks her family will eat healthier with her, her desired weight loss is 50 lbs, she started gaining weight about a year ago, her heaviest weight ever was 175 pounds, she has significant food cravings issues, she skips meals frequently, she is frequently drinking liquids with calories, she has problems with excessive hunger, and she struggles with emotional eating.  Depression Screen Annette Ellison's Food and Mood (modified PHQ-9) score was 17.     04/27/2022    7:32 AM  Depression screen PHQ 2/9  Decreased Interest 3  Down, Depressed, Hopeless 3  PHQ - 2 Score 6  Altered sleeping 3  Tired, decreased energy 3  Change in appetite 2  Feeling bad or failure about yourself  3  Trouble concentrating 0  Moving slowly or fidgety/restless 0  Suicidal thoughts 0  PHQ-9 Score 17  Difficult doing work/chores  Somewhat difficult   Subjective:   1. Other fatigue Annette Ellison admits to daytime somnolence and admits to waking up still tired. Patient has a history of symptoms of daytime fatigue, morning fatigue, and morning headache. Annette Ellison generally gets 4 or 5 hours of sleep per night, and states that she has nightime awakenings. Snoring is present. Apneic episodes are present. Epworth Sleepiness Score is 11.   2. SOBOE (shortness of breath on exertion) Annette Ellison notes increasing shortness of breath with exercising and seems to be worsening over time with weight gain. She notes getting out of breath sooner with activity than she used to. This has not gotten worse recently. Annette Ellison denies shortness of breath at rest or orthopnea.  3. GAD (generalized anxiety disorder) Annette Ellison is on Zoloft 25 mg daily and BuSpar 10 mg 3 times daily.  4. Sleep-disordered breathing Annette Ellison is waking up with headaches. She snores at night, and has a family history of obstructive sleep apnea.  Her Epworth score is 11.  5. Other depression, with emotional eating.  Annette Ellison's PHQ-9 score is 17.  She has a good support system.  Assessment/Plan:   1. Other fatigue Annette Ellison does feel that her weight is causing her energy to be lower than it should be. Fatigue may be related to obesity, depression or many other causes. Labs will be ordered, and in the meanwhile, Ha will focus on self care including making healthy food choices, increasing physical activity and focusing on stress reduction.  -  EKG 12-Lead - VITAMIN D 25 Hydroxy (Vit-D Deficiency, Fractures) - TSH - T4, free - Lipid panel - Insulin, random - Hemoglobin A1c - Folate - Vitamin B12 - CBC with Differential/Platelet  2. SOBOE (shortness of breath on exertion) Annette Ellison does feel that she gets out of breath more easily that she used to when she exercises. [name]'s shortness of breath appears to be obesity related and exercise induced. She has agreed to work on weight loss and gradually increase  exercise to treat her exercise induced shortness of breath. Will continue to monitor closely.  3. GAD (generalized anxiety disorder) Annette Ellison will consider increasing her dose of Zoloft if needed.  4. Sleep-disordered breathing We will refer Annette Ellison to Dr. Vickey Ellison for sleep study.  - Ambulatory referral to Neurology  5. Other depression, with emotional eating.  Annette Ellison will continue adding cognitive behavior therapy with Dr. Dewaine Ellison, our bariatric psychologist.  6. Obesity, current BMI 32.0 Annette Ellison is currently in the action stage of change and her goal is to continue with weight loss efforts. I recommend Annette Ellison begin the structured treatment plan as follows:  She has agreed to the Category 2 Plan.  Exercise goals: Track daily steps.    Behavioral modification strategies: increasing lean protein intake, increasing water intake, decreasing liquid calories, decreasing eating out, no skipping meals, meal planning and cooking strategies, better snacking choices, and decreasing junk food.  She was informed of the importance of frequent follow-up visits to maximize her success with intensive lifestyle modifications for her multiple health conditions. She was informed we would discuss her lab results at her next visit unless there is a critical issue that needs to be addressed sooner. Annette Ellison agreed to keep her next visit at the agreed upon time to discuss these results.  Objective:   Blood pressure 101/65, pulse 85, temperature 98.3 F (36.8 C), height 5\' 2"  (1.575 m), weight 175 lb (79.4 kg), SpO2 98 %. Body mass index is 32.01 kg/m.  EKG: Normal sinus rhythm, rate 84 BPM.  Indirect Calorimeter completed today shows a VO2 of 234 and a REE of 1613.  Her calculated basal metabolic rate is 8119 thus her basal metabolic rate is better than expected.  General: Cooperative, alert, well developed, in no acute distress. HEENT: Conjunctivae and lids unremarkable. Cardiovascular: Regular rhythm.  Lungs: Normal  work of breathing. Neurologic: No focal deficits.   Lab Results  Component Value Date   CREATININE 0.85 11/25/2021   BUN 14 11/25/2021   NA 137 11/25/2021   K 3.6 11/25/2021   CL 108 11/25/2021   CO2 23 11/25/2021   Lab Results  Component Value Date   ALT 27 11/25/2021   AST 18 11/25/2021   ALKPHOS 73 11/25/2021   BILITOT 0.4 11/25/2021   Lab Results  Component Value Date   HGBA1C 5.4 04/27/2022   Lab Results  Component Value Date   INSULIN 25.7 (H) 04/27/2022   Lab Results  Component Value Date   TSH 0.877 04/27/2022   Lab Results  Component Value Date   CHOL 220 (H) 04/27/2022   HDL 66 04/27/2022   LDLCALC 134 (H) 04/27/2022   TRIG 112 04/27/2022   CHOLHDL 3.3 04/27/2022   Lab Results  Component Value Date   WBC 8.2 04/27/2022   HGB 14.8 04/27/2022   HCT 45.6 04/27/2022   MCV 92 04/27/2022   PLT 272 04/27/2022   No results found for: "IRON", "TIBC", "FERRITIN"  Attestation Statements:   Reviewed by clinician on day of visit:  allergies, medications, problem list, medical history, surgical history, family history, social history, and previous encounter notes.   Trude Mcburney, am acting as transcriptionist for Seymour Bars, DO.  I have reviewed the above documentation for accuracy and completeness, and I agree with the above. Glennis Brink, DO

## 2022-05-11 ENCOUNTER — Encounter (INDEPENDENT_AMBULATORY_CARE_PROVIDER_SITE_OTHER): Payer: Self-pay | Admitting: Family Medicine

## 2022-05-11 ENCOUNTER — Ambulatory Visit (INDEPENDENT_AMBULATORY_CARE_PROVIDER_SITE_OTHER): Payer: 59 | Admitting: Family Medicine

## 2022-05-11 VITALS — BP 115/77 | HR 100 | Temp 97.9°F | Ht 62.0 in | Wt 171.0 lb

## 2022-05-11 DIAGNOSIS — E8881 Metabolic syndrome: Secondary | ICD-10-CM

## 2022-05-11 DIAGNOSIS — F411 Generalized anxiety disorder: Secondary | ICD-10-CM

## 2022-05-11 DIAGNOSIS — E669 Obesity, unspecified: Secondary | ICD-10-CM

## 2022-05-11 DIAGNOSIS — E559 Vitamin D deficiency, unspecified: Secondary | ICD-10-CM

## 2022-05-11 DIAGNOSIS — F3289 Other specified depressive episodes: Secondary | ICD-10-CM

## 2022-05-11 DIAGNOSIS — Z6831 Body mass index (BMI) 31.0-31.9, adult: Secondary | ICD-10-CM

## 2022-05-11 DIAGNOSIS — G4730 Sleep apnea, unspecified: Secondary | ICD-10-CM | POA: Diagnosis not present

## 2022-05-11 MED ORDER — VITAMIN D (ERGOCALCIFEROL) 1.25 MG (50000 UNIT) PO CAPS: 50000.0000 [IU] | ORAL_CAPSULE | ORAL | 0 refills | Status: DC

## 2022-05-11 NOTE — Progress Notes (Unsigned)
Chief Complaint:   OBESITY Annette Ellison is here to discuss her progress with her obesity treatment plan along with follow-up of her obesity related diagnoses. Annette Ellison is on the Category 2 Plan and states she is following her eating plan approximately 95% of the time. Annette Ellison states she is not exercising.   Today's visit was #: 2 Starting weight: 175 lbs Starting date: 04/27/2022 Today's weight: 171 lbs Today's date: 05/11/2022 Total lbs lost to date: 4 lbs Total lbs lost since last in-office visit: 4 lbs  Interim History: She is doing great on meal plan, with meal planning and meal prep.  She has been eating more fruit.  She plans to walk at the park more often.  Walking while kids are at soccer practice.  She denies hunger or cravings.  Her husband is supportive.   Subjective:   1. Vitamin D deficiency Discussed labs with patient today. Vitamin D level 14.6.  Was on Vitamin D prescription earlier this year, but ran out.   2. Insulin resistance Discussed labs with patient today. Fasting insulin 25.7, A1c WNL.  Denies history of gestational diabetes.   3. Sleep-disordered breathing Referral to Neurology made last visit, but her call to schedule was only to collect money.   4. GAD (generalized anxiety disorder) She is on Sertraline 25 mg and Buspar 10 mg TID, as needed.    5. Other depression with emotional eating She has a better handle on stress eating.  Stress at work is high.  Practicing mindful eating.   Assessment/Plan:   1. Vitamin D deficiency Begin - Vitamin D, Ergocalciferol, (DRISDOL) 1.25 MG (50000 UNIT) CAPS capsule; Take 1 capsule (50,000 Units total) by mouth every 7 (seven) days.  Dispense: 5 capsule; Refill: 0  2. Insulin resistance Continue working on reducing intake of sugar.    3. Sleep-disordered breathing Hold off on sleep study for now.   4. GAD (generalized anxiety disorder) She has appointment to see PCP to discuss options.   5. Other depression with  emotional eating Hold off on CBT with Dr Mallie Mussel, we discussed adding in the future.   6. Obesity, Current BMI 31.3 Handout:  protein sources other than meat.   Annette Ellison is currently in the action stage of change. As such, her goal is to continue with weight loss efforts. She has agreed to the Category 2 Plan.   Exercise goals:  walk 30 minutes at least 3-4 times per week.   Behavioral modification strategies: increasing lean protein intake, increasing vegetables, increasing water intake, decreasing eating out, no skipping meals, meal planning and cooking strategies, better snacking choices, and decreasing junk food.  Annette Ellison has agreed to follow-up with our clinic in 2 weeks. She was informed of the importance of frequent follow-up visits to maximize her success with intensive lifestyle modifications for her multiple health conditions.   Objective:   Blood pressure 115/77, pulse 100, temperature 97.9 F (36.6 C), height 5\' 2"  (1.575 m), weight 171 lb (77.6 kg), SpO2 98 %. Body mass index is 31.28 kg/m.  General: Cooperative, alert, well developed, in no acute distress. HEENT: Conjunctivae and lids unremarkable. Cardiovascular: Regular rhythm.  Lungs: Normal work of breathing. Neurologic: No focal deficits.   Lab Results  Component Value Date   CREATININE 0.85 11/25/2021   BUN 14 11/25/2021   NA 137 11/25/2021   K 3.6 11/25/2021   CL 108 11/25/2021   CO2 23 11/25/2021   Lab Results  Component Value Date   ALT 27  11/25/2021   AST 18 11/25/2021   ALKPHOS 73 11/25/2021   BILITOT 0.4 11/25/2021   Lab Results  Component Value Date   HGBA1C 5.4 04/27/2022   Lab Results  Component Value Date   INSULIN 25.7 (H) 04/27/2022   Lab Results  Component Value Date   TSH 0.877 04/27/2022   Lab Results  Component Value Date   CHOL 220 (H) 04/27/2022   HDL 66 04/27/2022   LDLCALC 134 (H) 04/27/2022   TRIG 112 04/27/2022   CHOLHDL 3.3 04/27/2022   Lab Results  Component Value  Date   VD25OH 14.6 (L) 04/27/2022   Lab Results  Component Value Date   WBC 8.2 04/27/2022   HGB 14.8 04/27/2022   HCT 45.6 04/27/2022   MCV 92 04/27/2022   PLT 272 04/27/2022   No results found for: "IRON", "TIBC", "FERRITIN"  Attestation Statements:   Reviewed by clinician on day of visit: allergies, medications, problem list, medical history, surgical history, family history, social history, and previous encounter notes.  I, Davy Pique, am acting as Location manager for Loyal Gambler, DO.  I have reviewed the above documentation for accuracy and completeness, and I agree with the above. Dell Ponto, DO

## 2022-06-01 ENCOUNTER — Encounter (INDEPENDENT_AMBULATORY_CARE_PROVIDER_SITE_OTHER): Payer: Self-pay | Admitting: Family Medicine

## 2022-06-01 ENCOUNTER — Ambulatory Visit (INDEPENDENT_AMBULATORY_CARE_PROVIDER_SITE_OTHER): Payer: 59 | Admitting: Family Medicine

## 2022-06-01 VITALS — BP 122/81 | HR 83 | Temp 98.3°F | Ht 62.0 in | Wt 172.0 lb

## 2022-06-01 DIAGNOSIS — Z6831 Body mass index (BMI) 31.0-31.9, adult: Secondary | ICD-10-CM

## 2022-06-01 DIAGNOSIS — F3289 Other specified depressive episodes: Secondary | ICD-10-CM

## 2022-06-01 DIAGNOSIS — E669 Obesity, unspecified: Secondary | ICD-10-CM | POA: Diagnosis not present

## 2022-06-01 DIAGNOSIS — M549 Dorsalgia, unspecified: Secondary | ICD-10-CM

## 2022-06-01 DIAGNOSIS — E559 Vitamin D deficiency, unspecified: Secondary | ICD-10-CM

## 2022-06-01 MED ORDER — VITAMIN D (ERGOCALCIFEROL) 1.25 MG (50000 UNIT) PO CAPS: 50000.0000 [IU] | ORAL_CAPSULE | ORAL | 0 refills | Status: DC

## 2022-06-01 MED ORDER — LOMAIRA 8 MG PO TABS: ORAL_TABLET | ORAL | 0 refills | Status: DC

## 2022-06-09 ENCOUNTER — Ambulatory Visit (INDEPENDENT_AMBULATORY_CARE_PROVIDER_SITE_OTHER): Payer: 59 | Admitting: Neurology

## 2022-06-09 ENCOUNTER — Encounter: Payer: Self-pay | Admitting: Neurology

## 2022-06-09 VITALS — BP 124/83 | HR 90 | Ht 62.0 in | Wt 173.5 lb

## 2022-06-09 DIAGNOSIS — F411 Generalized anxiety disorder: Secondary | ICD-10-CM | POA: Diagnosis not present

## 2022-06-09 DIAGNOSIS — F5102 Adjustment insomnia: Secondary | ICD-10-CM

## 2022-06-09 DIAGNOSIS — G43001 Migraine without aura, not intractable, with status migrainosus: Secondary | ICD-10-CM | POA: Diagnosis not present

## 2022-06-09 DIAGNOSIS — G44019 Episodic cluster headache, not intractable: Secondary | ICD-10-CM

## 2022-06-09 DIAGNOSIS — G478 Other sleep disorders: Secondary | ICD-10-CM | POA: Diagnosis not present

## 2022-06-09 DIAGNOSIS — G2581 Restless legs syndrome: Secondary | ICD-10-CM | POA: Diagnosis not present

## 2022-06-09 MED ORDER — IMIPRAMINE HCL 25 MG PO TABS: 25.0000 mg | ORAL_TABLET | Freq: Every day | ORAL | 3 refills | Status: DC

## 2022-06-09 NOTE — Progress Notes (Unsigned)
Chief Complaint:   OBESITY Annette Ellison is here to discuss her progress with her obesity treatment plan along with follow-up of her obesity related diagnoses. Annette Ellison is on the Category 2 Plan and states she is following her eating plan approximately 100% of the time. Annette Ellison states she is walking 30 minutes 3 times per week.  Today's visit was #: 3 Starting weight: 175 lbs Starting date: 04/27/2022 Today's weight: 172 lbs Today's date: 06/01/2022 Total lbs lost to date: 3 lbs Total lbs lost since last in-office visit: +1 lb  Interim History: Getting food in on Category 2 meal plan.  Has not been eating out.  She denies hunger or cravings.  She is getting 5-6 hours of sleep.  Her back pain has been limiting sleep and exercise but is able to walk.  She is frustrated by lack of weight loss.   Subjective:   1. Back pain, unspecified back location, unspecified back pain laterality, unspecified chronicity Completed PT 3 years ago for back pain.  Going [facility] to [facility].  Started gabapentin 100 mg daily.  Pain worse with sitting and standing.  Able to walk some.   2. Vitamin D deficiency She is currently taking prescription vitamin D 50,000 IU each week. She denies nausea, vomiting or muscle weakness. Last Vitamin D 14.6.   3. Other depression Reports changing Sertraline to Vraylar.  Mood improving.  Support at home is good.   Assessment/Plan:   1. Back pain, unspecified back location, unspecified back pain laterality, unspecified chronicity Continue walking.  Explained that pain, loss of sleep, gabapentin  may all have hindered her weight loss.   2. Vitamin D deficiency Refill - Vitamin D, Ergocalciferol, (DRISDOL) 1.25 MG (50000 UNIT) CAPS capsule; Take 1 capsule (50,000 Units total) by mouth every 7 (seven) days.  Dispense: 5 capsule; Refill: 0  3. Other depression Follow up with PCP, continue current medications.   4. Obesity, current BMI 31.5 Reviewed MOA and potential side effects.  Informed consent signed.   Refill - Phentermine HCl (LOMAIRA) 8 MG TABS; 1 tab po 30 min before breakfast daily  Dispense: 30 tablet; Refill: 0  Annette Ellison is currently in the action stage of change. As such, her goal is to continue with weight loss efforts. She has agreed to the Category 2 Plan.   Exercise goals:  As is.   Behavioral modification strategies: increasing lean protein intake, increasing vegetables, increasing water intake, decreasing eating out, no skipping meals, meal planning and cooking strategies, keeping healthy foods in the home, and decreasing junk food.  Annette Ellison has agreed to follow-up with our clinic in 3 weeks. She was informed of the importance of frequent follow-up visits to maximize her success with intensive lifestyle modifications for her multiple health conditions.   Objective:   Blood pressure 122/81, pulse 83, temperature 98.3 F (36.8 C), height 5\' 2"  (1.575 m), weight 172 lb (78 kg), SpO2 98 %. Body mass index is 31.46 kg/m.  General: Cooperative, alert, well developed, in no acute distress. HEENT: Conjunctivae and lids unremarkable. Cardiovascular: Regular rhythm.  Lungs: Normal work of breathing. Neurologic: No focal deficits.   Lab Results  Component Value Date   CREATININE 0.85 11/25/2021   BUN 14 11/25/2021   NA 137 11/25/2021   K 3.6 11/25/2021   CL 108 11/25/2021   CO2 23 11/25/2021   Lab Results  Component Value Date   ALT 27 11/25/2021   AST 18 11/25/2021   ALKPHOS 73 11/25/2021  BILITOT 0.4 11/25/2021   Lab Results  Component Value Date   HGBA1C 5.4 04/27/2022   Lab Results  Component Value Date   INSULIN 25.7 (H) 04/27/2022   Lab Results  Component Value Date   TSH 0.877 04/27/2022   Lab Results  Component Value Date   CHOL 220 (H) 04/27/2022   HDL 66 04/27/2022   LDLCALC 134 (H) 04/27/2022   TRIG 112 04/27/2022   CHOLHDL 3.3 04/27/2022   Lab Results  Component Value Date   VD25OH 14.6 (L) 04/27/2022   Lab Results   Component Value Date   WBC 8.2 04/27/2022   HGB 14.8 04/27/2022   HCT 45.6 04/27/2022   MCV 92 04/27/2022   PLT 272 04/27/2022   No results found for: "IRON", "TIBC", "FERRITIN"  Attestation Statements:   Reviewed by clinician on day of visit: allergies, medications, problem list, medical history, surgical history, family history, social history, and previous encounter notes.  I, Davy Pique, am acting as Location manager for Loyal Gambler, DO.  I have reviewed the above documentation for accuracy and completeness, and I agree with the above. Dell Ponto, DO

## 2022-06-09 NOTE — Progress Notes (Signed)
SLEEP MEDICINE CLINIC    Provider:  Melvyn Novas, MD  Primary Care Physician:  Marva Panda, NP [address] [place] [address]     Referring Provider: Mart Piggs [address] [place],  [place] [address]          Chief Complaint according to patient   Patient presents with:     New Patient (Initial Visit)           HISTORY OF PRESENT ILLNESS:  Annette Ellison is a 36 y.o. year old Other or two or more races female patient seen here as a referral on 06/09/2022 from weight and wellness physician Dr Seymour Bars, DO.   for a sleep evaluation. Chief concern according to patient : " I have gained weight, developed a sweet tooth and I have not been able to loose weight. I snore. I wake with headaches in AM and have been woken by sharp headaches" . She described  how difficult it is to shut her mind down, she feels she bringing her home work home in he id, and she feels as if she is on the verge of falling asleep but still vigilant to the surrounding. Vivid dreams. Crying in her sleep. Not sleep walking excepts once on Ambien.  Dr Neale Burly is her headache specialist, Dr Maple Hudson interpreted the HST , 07-02-2015.  [birth year]29 yoF never smoker referred courtesy of Dr Gaynell Face [facility]; trouble falling asleep and staying asleep X2 years; no sleep study.  Epworth Score 13.  Partner has told her she shores loudly and has recorded her. No ENT surgery. Daytime drowsiness sometimes. "Always feels fatigued". She complains of difficulty initiating and maintaining sleep with sleep latency of one hour and waking at least 3 times. Bedtime between 9:30 and 10:30 PM, up at 5:45 AM. Has lost about 46 pounds over the last 2 years. She complains of feeling too warm at night although bedroom temp is 68. Kids say they are cold in the house. Doesn't know about prior thyroid testing. Melatonin did not help sleep. Topamax helps headaches but she still wakes  in the morning with headache on many days. No caffeine. 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    I have the pleasure of seeing Annette Ellison today, a right-handed Other or two or more races female with a possible sleep disorder.  She has a past medical history of Allergic rhinitis, Anxiety, Asthma, Back pain, Depression, Elevated cholesterol, GERD (gastroesophageal reflux disease), IBS (irritable bowel syndrome), Joint pain, Lactose intolerance, and Wears contact lenses.   The patient had the first sleep study in the year 2016 with a result of an AHI ( Apnea Hypopnea index)  of inconclusive.  Sleep relevant medical history: Nocturia RLS, racing mind, sleep paralysis,  Night terrors, other Parasomnia, myalgia, joint pain.    Family medical /sleep history: daughter as a baby with OSA,  MGM, mother has insomnia.    Social history:  Patient is working as a Neurosurgeon.  She lives in a household with spouse and 2 kids and a cousin.  Pets are present, indoor dogs. . Tobacco use- never .  ETOH use -none ,  Caffeine intake in form of Coffee( decaff) Soda( /) Tea ( /) or energy drinks. Regular exercise in form of 30 minutes walk.     Sleep habits are as follows: The patient's dinner time is between 7 PM. The patient goes to bed at 10 PM and is rarely  asleep before 1 AM, her husband is earlier in bed and he snores loudly- she continues to sleep for 5 hours, wakes for unknown causes.   The preferred sleep position is right side , with the support of 3 pillows.  Dreams are reportedly frequent/vivid. 6  AM is the usual rise time. The patient wakes up spontaneously. She reports not feeling refreshed or restored in AM, with symptoms such as dry mouth, morning headaches, and residual fatigue.  Naps are taken infrequently, " I tried but I can't ' its only when I watch TV  will I doze off' ,lasting from 5 to 20 minutes and are no more refreshing than nocturnal sleep.    Review of Systems: Out of a  complete 14 system review, the patient complains of only the following symptoms, and all other reviewed systems are negative.:  Fatigue, sleepiness , snoring, fragmented sleep, Insomnia - difficulties to initiate sleep and to stay asleep and to sleep into the later hours.   Sleep paralysis.     How likely are you to doze in the following situations: 0 = not likely, 1 = slight chance, 2 = moderate chance, 3 = high chance   Sitting and Reading? Watching Television? Sitting inactive in a public place (theater or meeting)? As a passenger in a car for an hour without a break? Lying down in the afternoon when circumstances permit? Sitting and talking to someone? Sitting quietly after lunch without alcohol? In a car, while stopped for a few minutes in traffic?   Total = 11/ 24 points   FSS endorsed at 45/ 63 points.   No cataplectic attacks, no sleep attacks.   Social History   Socioeconomic History   Marital status: Married    Spouse name: luis torrg   Number of children: 2   Years of education: College   Highest education level: Not on file  Occupational History   Occupation: CREDIT    Employer: [employer]   Occupation: Chief of Staff  Tobacco Use   Smoking status: Never   Smokeless tobacco: Never  Vaping Use   Vaping Use: Never used  Substance and Sexual Activity   Alcohol use: Not Currently    Alcohol/week: 1.0 standard drink of alcohol    Types: 1 Glasses of wine per week    Comment: OCC   Drug use: No   Sexual activity: Not on file  Other Topics Concern   Not on file  Social History Narrative   Patient lives at home with her partner. Patient works Full time at [employer]   Right handed   Caffeine two cups of coffee daily   Social Determinants of Radio broadcast assistant Strain: Not on file  Food Insecurity: Not on file  Transportation Needs: Not on file  Physical Activity: Not on file  Stress: Not on file  Social Connections: Not on  file    Family History  Problem Relation Age of Onset   Migraines Mother    High Cholesterol Mother    High blood pressure Mother    Depression Mother    Obesity Mother    Cancer Father    Breast cancer Maternal Aunt    Mental illness Other    Stroke Other    Diabetes Other     Past Medical History:  Diagnosis Date   Allergic rhinitis    Anxiety    Asthma    Back pain    Depression  Elevated cholesterol    GERD (gastroesophageal reflux disease)    IBS (irritable bowel syndrome)    Constipation   Joint pain    Lactose intolerance    Wears contact lenses     Past Surgical History:  Procedure Laterality Date   ABDOMINAL HYSTERECTOMY  2012   W/  LEFT SALPINGOOPHORECTOMY   CESAREAN SECTION  08-12-2010   CYSTO WITH HYDRODISTENSION N/A 03/15/2014   Procedure: CYSTO/HYDRODISTENSION OF BLADDER/INSTALLATION of marcaine and pyridium;  Surgeon: Martina Sinner, MD;  [facility];  Service: Urology;  Laterality: N/A;   LAPAROSCOPY W/ OVARIAN CYSTECTOMY  2010        Allergies  Allergen Reactions   Penicillins Shortness Of Breath and Rash   Pineapple Shortness Of Breath and Rash   Other Rash    RED BEANS   Vicodin [Hydrocodone-Acetaminophen] Rash    Physical exam:  Today's Vitals   06/09/22 1458  BP: 124/83  Pulse: 90  Weight: 173 lb 8 oz (78.7 kg)  Height: 5\' 2"  (1.575 m)   Body mass index is 31.73 kg/m.   Wt Readings from Last 3 Encounters:  06/09/22 173 lb 8 oz (78.7 kg)  06/01/22 172 lb (78 kg)  05/11/22 171 lb (77.6 kg)     Ht Readings from Last 3 Encounters:  06/09/22 5\' 2"  (1.575 m)  06/01/22 5\' 2"  (1.575 m)  05/11/22 5\' 2"  (1.575 m)      General: The patient is awake, alert and appears not in acute distress. The patient is well groomed. Head: Normocephalic, atraumatic. Neck is supple. Mallampati : 2,  neck circumference:14 inches . Nasal airflow patent.  Retrognathia is not seen.  Dental status: biological .   Cardiovascular:  Regular rate and cardiac rhythm by pulse,  without distended neck veins. Respiratory: Lungs are clear to auscultation.  Skin:  Without evidence of ankle edema, or rash. Trunk: The patient's posture is erect.   Neurologic exam : The patient is awake and alert, oriented to place and time.   Memory subjective described as intact.  Attention span & concentration ability appears normal.  Speech is fluent,  without  dysarthria, dysphonia or aphasia.  Mood and affect are appropriate.   Cranial nerves: no loss of smell or taste reported  Pupils are equal and briskly reactive to light. Funduscopic exam deferred. .  Extraocular movements in vertical and horizontal planes were intact and without nystagmus. No Diplopia. Visual fields by finger perimetry are intact. Hearing was intact to soft voice and finger rubbing.    Facial sensation intact to fine touch.  Facial motor strength is symmetric and tongue and uvula move midline.  Neck ROM : rotation, tilt and flexion extension were normal for age and shoulder shrug was symmetrical.    Motor exam:  Symmetric bulk, tone and ROM.   Normal tone without cog -wheeling, symmetric grip strength .   Sensory: carpal tunnel on the right, dominant hand. Fine touch, pinprick and vibration were tested  and  normal.  Proprioception tested in the upper extremities was normal.    Coordination: Rapid alternating movements in the fingers/hands were of normal speed.  The Finger-to-nose maneuver was intact without evidence of ataxia, dysmetria or tremor.   Gait and station: Patient could rise unassisted from a seated position, walked without assistive device.  Stance is of normal width/ base and the patient turned with 3 steps.  Toe and heel walk were deferred.  Deep tendon reflexes: in the  upper and lower  extremities are symmetric and intact.  Babinski response was deferred .       After spending a total time of  40  minutes face to face and  additional time for physical and neurologic examination, review of laboratory studies,  personal review of imaging studies, reports and results of other testing and review of referral information / records as far as provided in visit, I have established the following assessments:  1) 2017 previously tested by HST, no apnea was found but the patient has gained weight since.   2) patient has been snoring, waking up frequently and is not why- vivid dreams, sleep paralysis and a feeling of hypervigilance- mind is busy all night. There is also a paradox Insomnia to initiate sleep, often taking her 2 hours.  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 Her morning headaches can be related to snoring, OSA or hypoxia.    My Plan is to proceed with:  1) PSG ordered.   in lab sleep study to evaluate for RLS , possible translation into PLMs. May cause arousals, screening again for apnea and possible hypoxia as cause of cluster and morning headaches.   Hypersomnia is in her case not associated with prolonged sleep time.   I would like to thank for allowing me to meet with and to take care of this pleasant patient.   In short, [place] is presenting with Insomnia, snoring, headaches and inability to loose weight .  I plan to follow up either personally or through our NP within 3-4 months.   CC: I will share my notes with Dr Cathey Endow.  Electronically signed by: Melvyn Novas, MD 06/09/2022 3:32 PM  [facility] and [facility] Sleep Board certified by The [employer] of Sleep Medicine and Diplomate of the [employer] of Sleep Medicine. Board certified In Neurology through the ABPN, Fellow of the [employer] of Neurology. Medical Director of [employer].

## 2022-06-22 ENCOUNTER — Ambulatory Visit (INDEPENDENT_AMBULATORY_CARE_PROVIDER_SITE_OTHER): Payer: 59 | Admitting: Family Medicine

## 2022-06-23 ENCOUNTER — Encounter (INDEPENDENT_AMBULATORY_CARE_PROVIDER_SITE_OTHER): Payer: Self-pay | Admitting: Family Medicine

## 2022-06-23 ENCOUNTER — Ambulatory Visit (INDEPENDENT_AMBULATORY_CARE_PROVIDER_SITE_OTHER): Payer: 59 | Admitting: Family Medicine

## 2022-06-23 VITALS — BP 134/90 | HR 116 | Temp 98.7°F | Ht 62.0 in | Wt 168.0 lb

## 2022-06-23 DIAGNOSIS — E559 Vitamin D deficiency, unspecified: Secondary | ICD-10-CM | POA: Diagnosis not present

## 2022-06-23 DIAGNOSIS — G4709 Other insomnia: Secondary | ICD-10-CM

## 2022-06-23 DIAGNOSIS — E66811 Obesity, class 1: Secondary | ICD-10-CM | POA: Insufficient documentation

## 2022-06-23 DIAGNOSIS — E669 Obesity, unspecified: Secondary | ICD-10-CM | POA: Diagnosis not present

## 2022-06-23 DIAGNOSIS — Z6830 Body mass index (BMI) 30.0-30.9, adult: Secondary | ICD-10-CM

## 2022-06-23 DIAGNOSIS — I10 Essential (primary) hypertension: Secondary | ICD-10-CM | POA: Diagnosis not present

## 2022-06-23 HISTORY — DX: Essential (primary) hypertension: I10

## 2022-06-23 MED ORDER — LOMAIRA 8 MG PO TABS: ORAL_TABLET | ORAL | 0 refills | Status: DC

## 2022-06-23 MED ORDER — VITAMIN D (ERGOCALCIFEROL) 1.25 MG (50000 UNIT) PO CAPS: 50000.0000 [IU] | ORAL_CAPSULE | ORAL | 0 refills | Status: DC

## 2022-07-01 NOTE — Progress Notes (Signed)
Chief Complaint:   OBESITY Annette Ellison is here to discuss her progress with her obesity treatment plan along with follow-up of her obesity related diagnoses. Annette Ellison is on the Category 2 Plan and states she is following her eating plan approximately 100% of the time. Annette Ellison states she is doing 0 minutes 0 times per week.  Today's visit was #: 4 Starting weight: 175 lbs Starting date: 04/27/2022 Today's weight: 168 lbs Today's date: 06/23/2022 Total lbs lost to date: 7 Total lbs lost since last in-office visit: 4  Interim History: Annette Ellison has done well with her weight loss.  She reports no side effects with phentermine.  She feels she is eating everything on the plan.  She does report hunger later during the day and we discussed taking phentermine a little earlier in the morning, but no later than noon.  Subjective:   1. Vitamin D deficiency Annette Ellison's last vitamin D level was 14.6.  She is taking vitamin D prescription with no side effects noted.  2. Elevated blood pressure reading with diagnosis of hypertension Annette Ellison reports heart rate elevation on her Apple watch, and her resting heart rate was elevated but this has improved at 1 week ago.  Now her resting heart rate is <100.  Her blood pressure and heart rate are elevated this morning.  She reports elevated heart rate started after starting imipramine.  3. Other insomnia Annette Ellison saw Annette Ellison.  Her heart rate and blood pressure have been elevated since starting imipramine.  She does not feel this helped with sleep since starting approximately 3 weeks ago.  Assessment/Plan:   1. Vitamin D deficiency Annette Ellison will continue prescription Vitamin D 50,000 IU every week, and we will refill for 1 month. She will follow-up for routine testing of Vitamin D, at least 2-3 times per year to avoid over-replacement.  - Vitamin D, Ergocalciferol, (DRISDOL) 1.25 MG (50000 UNIT) CAPS capsule; Take 1 capsule (50,000 Units total) by mouth every 7 (seven) days.   Dispense: 5 capsule; Refill: 0  2. Elevated blood pressure reading with diagnosis of hypertension Annette Ellison agreed to discontinue imipramine for now. She will continue to monitor her blood pressure and heart rate for the next few weeks.   3. Other insomnia Annette Ellison will discontinue imipramine due to concerns with interaction with phentermine only.  Patient reports Annette Ellison plans for her to have a sleep study as well, and she will follow-up with Annette Ellison.   4. Obesity, current BMI 30.8 Annette Ellison is currently in the action stage of change. As such, her goal is to continue with weight loss efforts. She has agreed to the Category 2 Plan.   Annette Ellison will continue Lomaira 8 mg once daily, and we will refill for 1 month.   - Phentermine HCl (LOMAIRA) 8 MG TABS; 1 tab po 30 min before breakfast daily  Dispense: 30 tablet; Refill: 0  Exercise goals: As is.   Behavioral modification strategies: increasing lean protein intake, decreasing simple carbohydrates, meal planning and cooking strategies, holiday eating strategies , and planning for success.  Annette Ellison has agreed to follow-up with our clinic in 3 weeks with Dr. Cathey Endow. She was informed of the importance of frequent follow-up visits to maximize her success with intensive lifestyle modifications for her multiple health conditions.   Objective:   Blood pressure (!) 134/90, pulse (!) 116, temperature 98.7 F (37.1 C), height 5\' 2"  (1.575 m), weight 168 lb (76.2 kg), SpO2 98 %. Body mass index is 30.73 kg/m.  General:  Cooperative, alert, well developed, in no acute distress. HEENT: Conjunctivae and lids unremarkable. Cardiovascular: Regular rhythm.  Lungs: Normal work of breathing. Neurologic: No focal deficits.   Lab Results  Component Value Date   CREATININE 0.85 11/25/2021   BUN 14 11/25/2021   NA 137 11/25/2021   K 3.6 11/25/2021   CL 108 11/25/2021   CO2 23 11/25/2021   Lab Results  Component Value Date   ALT 27 11/25/2021   AST 18  11/25/2021   ALKPHOS 73 11/25/2021   BILITOT 0.4 11/25/2021   Lab Results  Component Value Date   HGBA1C 5.4 04/27/2022   Lab Results  Component Value Date   INSULIN 25.7 (H) 04/27/2022   Lab Results  Component Value Date   TSH 0.877 04/27/2022   Lab Results  Component Value Date   CHOL 220 (H) 04/27/2022   HDL 66 04/27/2022   LDLCALC 134 (H) 04/27/2022   TRIG 112 04/27/2022   CHOLHDL 3.3 04/27/2022   Lab Results  Component Value Date   VD25OH 14.6 (L) 04/27/2022   Lab Results  Component Value Date   WBC 8.2 04/27/2022   HGB 14.8 04/27/2022   HCT 45.6 04/27/2022   MCV 92 04/27/2022   PLT 272 04/27/2022   No results found for: "IRON", "TIBC", "FERRITIN"  Attestation Statements:   Reviewed by clinician on day of visit: allergies, medications, problem list, medical history, surgical history, family history, social history, and previous encounter notes.   I, Burt Knack, am acting as transcriptionist for Quillian Quince, MD.  I have reviewed the above documentation for accuracy and completeness, and I agree with the above. -  Quillian Quince, MD

## 2022-07-02 ENCOUNTER — Telehealth: Payer: Self-pay | Admitting: Neurology

## 2022-07-02 NOTE — Telephone Encounter (Signed)
[employer] pending uploaded notes  

## 2022-07-06 NOTE — Telephone Encounter (Signed)
Still pending

## 2022-07-13 ENCOUNTER — Ambulatory Visit (INDEPENDENT_AMBULATORY_CARE_PROVIDER_SITE_OTHER): Payer: 59 | Admitting: Family Medicine

## 2022-08-04 ENCOUNTER — Ambulatory Visit (INDEPENDENT_AMBULATORY_CARE_PROVIDER_SITE_OTHER): Payer: 59 | Admitting: Family Medicine

## 2022-08-04 NOTE — Telephone Encounter (Signed)
[employer] denied the NPSG.  HST- [employer] no auth req.  Patient is scheduled at [facility] for 09/01/22 at 3:30 pm.  Mailed packet to the patient.

## 2022-08-26 ENCOUNTER — Encounter (INDEPENDENT_AMBULATORY_CARE_PROVIDER_SITE_OTHER): Payer: Self-pay | Admitting: Family Medicine

## 2022-08-26 ENCOUNTER — Ambulatory Visit (INDEPENDENT_AMBULATORY_CARE_PROVIDER_SITE_OTHER): Payer: 59 | Admitting: Family Medicine

## 2022-08-26 VITALS — BP 116/83 | HR 71 | Temp 98.6°F | Ht 62.0 in | Wt 176.0 lb

## 2022-08-26 DIAGNOSIS — E88819 Insulin resistance, unspecified: Secondary | ICD-10-CM | POA: Diagnosis not present

## 2022-08-26 DIAGNOSIS — G4709 Other insomnia: Secondary | ICD-10-CM | POA: Diagnosis not present

## 2022-08-26 DIAGNOSIS — E7849 Other hyperlipidemia: Secondary | ICD-10-CM | POA: Insufficient documentation

## 2022-08-26 DIAGNOSIS — E669 Obesity, unspecified: Secondary | ICD-10-CM

## 2022-08-26 DIAGNOSIS — E559 Vitamin D deficiency, unspecified: Secondary | ICD-10-CM | POA: Diagnosis not present

## 2022-08-26 DIAGNOSIS — Z6830 Body mass index (BMI) 30.0-30.9, adult: Secondary | ICD-10-CM

## 2022-08-26 MED ORDER — METFORMIN HCL 500 MG PO TABS: 500.0000 mg | ORAL_TABLET | Freq: Two times a day (BID) | ORAL | 0 refills | Status: DC

## 2022-08-26 MED ORDER — VITAMIN D (ERGOCALCIFEROL) 1.25 MG (50000 UNIT) PO CAPS: 50000.0000 [IU] | ORAL_CAPSULE | ORAL | 0 refills | Status: DC

## 2022-09-01 ENCOUNTER — Ambulatory Visit: Payer: 59 | Admitting: Neurology

## 2022-09-01 DIAGNOSIS — F5102 Adjustment insomnia: Secondary | ICD-10-CM

## 2022-09-01 DIAGNOSIS — F411 Generalized anxiety disorder: Secondary | ICD-10-CM | POA: Diagnosis not present

## 2022-09-01 DIAGNOSIS — G2581 Restless legs syndrome: Secondary | ICD-10-CM

## 2022-09-01 DIAGNOSIS — G43001 Migraine without aura, not intractable, with status migrainosus: Secondary | ICD-10-CM

## 2022-09-01 DIAGNOSIS — G478 Other sleep disorders: Secondary | ICD-10-CM

## 2022-09-01 DIAGNOSIS — G44019 Episodic cluster headache, not intractable: Secondary | ICD-10-CM

## 2022-09-01 DIAGNOSIS — G4733 Obstructive sleep apnea (adult) (pediatric): Secondary | ICD-10-CM

## 2022-09-02 NOTE — Progress Notes (Signed)
See procedure note.

## 2022-09-07 ENCOUNTER — Telehealth: Payer: Self-pay | Admitting: Neurology

## 2022-09-07 NOTE — Telephone Encounter (Signed)
This patient saw Dr. Brett Fairy for sleep evaluation on 06/09/2022.  I read the HST from 09/01/2022 on Dr. Edwena Felty behalf:    Please call and notify the patient that the recent home sleep test showed obstructive sleep apnea. OSA is overall mild, but worth treating to see if she feels better after treatment. To that end I recommend treatment for this in the form of autoPAP, which means, that we don't have to bring her in for a sleep study with CPAP, but will let her try an autoPAP machine at home, through a DME company (of her choice, or as per insurance requirement). The DME representative will educate her on how to use the machine, how to put the mask on, etc. I have NOT placed an order in the chart yet, let me know how she would like to proceed, if she would like to pursue treatment through a dentist with an oral appliance, we can certainly facilitate and send a referral to dentistry.  If she would like to wait and discuss with Dr Brett Fairy further upon her return, she can wait, and discuss with Dr. Brett Fairy later this week or next week. Again, I would be happy to send an AutoPap order to a local DME provider.    Annette Age, MD, PhD [facility])

## 2022-09-07 NOTE — Procedures (Signed)
[facility]  HOME SLEEP TEST (Watch PAT) REPORT  STUDY DATE: 09/01/2022  DOB: 1985-10-28  MRN: [id number]  ORDERING CLINICIAN: Star Age, MD, PhD (Study was interpreted on behalf of Dr. Brett Fairy)     REFERRING CLINICIAN: Everardo Beals, NP (PCP), Dr. Brett Fairy (Sleep)  CLINICAL INFORMATION/HISTORY: 37 year old female with an underlying medical history of allergic rhinitis, asthma, hyperlipidemia, anxiety, depression, reflux disease, back pain, recurrent headaches, irritable bowel syndrome, and mild obesity, who reports snoring and excessive daytime somnolence as well as nonrestorative sleep.    Epworth sleepiness score: 11/24.  BMI: 31.7 kg/m  FINDINGS:   Sleep Summary:   Total Recording Time (hours, min): 8 hours, 43 min  Total Sleep Time (hours, min):  8 hours, 9 min  Percent REM (%):    25.7%   Respiratory Indices:   Calculated pAHI (per hour):  7.2/hour         REM pAHI:    13.1/hour       NREM pAHI: 5.2/hour  Central pAHI: 0.8/hour  Oxygen Saturation Statistics:    Oxygen Saturation (%) Mean: 96%   Minimum oxygen saturation (%):                 89%   O2 Saturation Range (%): 89 - 99%    O2 Saturation (minutes) <=88%: 0 min  Pulse Rate Statistics:   Pulse Mean (bpm):    78/min    Pulse Range (60 - 113/min)   IMPRESSION: OSA (obstructive sleep apnea), mild  RECOMMENDATION:  This home sleep test demonstrates overall mild obstructive sleep apnea with a total AHI of 7.2/hour and O2 nadir of 89%. Snoring was detected, and appeared to be intermittent, in the mild to moderate range. Given the patient's medical history and sleep related complaints, therapy with a  positive airway pressure device is a reasonable first-line choice and clinically justified. Treatment can be achieved in the form of autoPAP trial/titration at home for now. A full night, in-lab PAP titration study may aid in improving proper treatment settings and with mask  fit, if needed, down the road. Alternative treatments may include weight loss (where appropriate) along with avoidance of the supine sleep position (if possible), or an oral appliance in appropriate candidates.   Please note that untreated obstructive sleep apnea may carry additional perioperative morbidity. Patients with significant obstructive sleep apnea should receive perioperative PAP therapy and the surgeons and particularly the anesthesiologist should be informed of the diagnosis and the severity of the sleep disordered breathing. The patient should be cautioned not to drive, work at heights, or operate dangerous or heavy equipment when tired or sleepy. Review and reiteration of good sleep hygiene measures should be pursued with any patient. Other causes of the patient's symptoms, including circadian rhythm disturbances, an underlying mood disorder, medication effect and/or an underlying medical problem cannot be ruled out based on this test. Clinical correlation is recommended.  The patient and her referring provider will be notified of the test results. The patient will be seen in follow up in sleep clinic at [facility], as necessary.  I certify that I have reviewed the raw data recording prior to the issuance of this report in accordance with the standards of the American Academy of Sleep Medicine (AASM).  INTERPRETING PHYSICIAN:   Star Age, MD, PhD Medical Director, [facility]) [place], ABPN (Neurology and Sleep)   [facility] [address], [place], [place] [address] [phone]

## 2022-09-08 ENCOUNTER — Encounter: Payer: Self-pay | Admitting: Neurology

## 2022-09-17 NOTE — Progress Notes (Signed)
Chief Complaint:   OBESITY Annette Ellison is here to discuss her progress with her obesity treatment plan along with follow-up of her obesity related diagnoses. Annette Ellison is on the Category 2 Plan and states she is following her eating plan approximately 50% of the time. Annette Ellison states she is walking and sit ups 30 minutes 5 times per week.  Today's visit was #: 5 Starting weight: 175 lbs Starting date: 04/27/2022 Today's weight: 176 lbs Today's date: 08/26/2022 Total lbs lost to date: 0 Total lbs lost since last in-office visit: +8 lbs  Interim History: Patient had COVID-19 at the end of November 2023.  She ran out of [place] 1 month ago.  She has a net weight gain 4 LBS since 10/16.  She just finished 21 days of prednisone for thoracic back pain which did cause hunger and cravings.  Patient is getting in 3 meals per day and started home workouts with her son.  She is getting in more fruits and veggies.  Subjective:   1. Insulin resistance Fasting insulin 25.7.  Patient has never used metformin.  Has some sugar cravings.  has a family history of diabetes.   denies gestational diabetes.  2. Other hyperlipidemia Patient has a family history of high cholesterol.  On 04/27/2022, total cholesterol was 220, LDL 134.   has reduced intake of fried foods.  3. Vitamin D deficiency Last vitamin D level was 14.6 on 04/27/2022.  She is  taking a womens daily multivitamin.  She ran out of prescription vitamin D 1 month ago.  4. Other insomnia Patient is seeing Dr. Brett Fairy.  Sleep study has been ordered, plans to do a home sleep study.  Assessment/Plan:   1. Insulin resistance Begin metformin.  Recheck fasting insulin, BMP, B12 in 2 months.  Reviewed potential adverse side effects.  Begin- metFORMIN (GLUCOPHAGE) 500 MG tablet; Take 1 tablet (500 mg total) by mouth 2 (two) times daily with a meal.  Dispense: 30 tablet; Refill: 0  2. Other hyperlipidemia Recheck FLP in the next 2 months.  Continue low-fat  diet plan.  3. Vitamin D deficiency Recheck vitamin D level in March 2024.  Refill- Vitamin D, Ergocalciferol, (DRISDOL) 1.25 MG (50000 UNIT) CAPS capsule; Take 1 capsule (50,000 Units total) by mouth every 7 (seven) days.  Dispense: 5 capsule; Refill: 0  4. Other insomnia Follow-up results with neurology.  Lack of adequate sleep can impede weight loss.  5. Obesity, current BMI 30.8 1.  Discontinue prescription for Lomaira, lack of weight loss, increased heart rate and anxiety. 2.  Resume category 2 meal plan. - metFORMIN (GLUCOPHAGE) 500 MG tablet; Take 1 tablet (500 mg total) by mouth 2 (two) times daily with a meal.  Dispense: 30 tablet; Refill: 0  Annette Ellison is currently in the action stage of change. As such, her goal is to continue with weight loss efforts. She has agreed to the Category 2 Plan.   Exercise goals:  Home exercises 30 minutes 5 times a week.  Behavioral modification strategies: increasing lean protein intake, increasing vegetables, increasing water intake, decreasing eating out, no skipping meals, meal planning and cooking strategies, keeping healthy foods in the home, and planning for success.  Annette Ellison has agreed to follow-up with our clinic in 4 weeks. She was informed of the importance of frequent follow-up visits to maximize her success with intensive lifestyle modifications for her multiple health conditions.   Objective:   Blood pressure 116/83, pulse 71, temperature 98.6 F (37 C), height 5'  2" (1.575 m), weight 176 lb (79.8 kg), SpO2 98 %. Body mass index is 32.19 kg/m.  General: Cooperative, alert, well developed, in no acute distress. HEENT: Conjunctivae and lids unremarkable. Cardiovascular: Regular rhythm.  Lungs: Normal work of breathing. Neurologic: No focal deficits.   Lab Results  Component Value Date   CREATININE 0.85 11/25/2021   BUN 14 11/25/2021   NA 137 11/25/2021   K 3.6 11/25/2021   CL 108 11/25/2021   CO2 23 11/25/2021   Lab Results   Component Value Date   ALT 27 11/25/2021   AST 18 11/25/2021   ALKPHOS 73 11/25/2021   BILITOT 0.4 11/25/2021   Lab Results  Component Value Date   HGBA1C 5.4 04/27/2022   Lab Results  Component Value Date   INSULIN 25.7 (H) 04/27/2022   Lab Results  Component Value Date   TSH 0.877 04/27/2022   Lab Results  Component Value Date   CHOL 220 (H) 04/27/2022   HDL 66 04/27/2022   LDLCALC 134 (H) 04/27/2022   TRIG 112 04/27/2022   CHOLHDL 3.3 04/27/2022   Lab Results  Component Value Date   VD25OH 14.6 (L) 04/27/2022   Lab Results  Component Value Date   WBC 8.2 04/27/2022   HGB 14.8 04/27/2022   HCT 45.6 04/27/2022   MCV 92 04/27/2022   PLT 272 04/27/2022   No results found for: "IRON", "TIBC", "FERRITIN"  Attestation Statements:   Reviewed by clinician on day of visit: allergies, medications, problem list, medical history, surgical history, family history, social history, and previous encounter notes.  I, Davy Pique, am acting as Location manager for Loyal Gambler, DO.  I have reviewed the above documentation for accuracy and completeness, and I agree with the above. Dell Ponto, DO

## 2022-09-22 ENCOUNTER — Other Ambulatory Visit (INDEPENDENT_AMBULATORY_CARE_PROVIDER_SITE_OTHER): Payer: Self-pay | Admitting: Family Medicine

## 2022-09-23 ENCOUNTER — Encounter (INDEPENDENT_AMBULATORY_CARE_PROVIDER_SITE_OTHER): Payer: Self-pay | Admitting: Family Medicine

## 2022-09-23 ENCOUNTER — Ambulatory Visit (INDEPENDENT_AMBULATORY_CARE_PROVIDER_SITE_OTHER): Payer: 59 | Admitting: Family Medicine

## 2022-09-23 VITALS — BP 117/73 | HR 88 | Temp 98.6°F | Ht 62.0 in | Wt 172.0 lb

## 2022-09-23 DIAGNOSIS — E559 Vitamin D deficiency, unspecified: Secondary | ICD-10-CM

## 2022-09-23 DIAGNOSIS — G4733 Obstructive sleep apnea (adult) (pediatric): Secondary | ICD-10-CM

## 2022-09-23 DIAGNOSIS — Z6831 Body mass index (BMI) 31.0-31.9, adult: Secondary | ICD-10-CM

## 2022-09-23 DIAGNOSIS — E88819 Insulin resistance, unspecified: Secondary | ICD-10-CM

## 2022-09-23 DIAGNOSIS — E669 Obesity, unspecified: Secondary | ICD-10-CM

## 2022-09-23 MED ORDER — VITAMIN D (ERGOCALCIFEROL) 1.25 MG (50000 UNIT) PO CAPS: 50000.0000 [IU] | ORAL_CAPSULE | ORAL | 0 refills | Status: DC

## 2022-09-23 MED ORDER — METFORMIN HCL 500 MG PO TABS: 500.0000 mg | ORAL_TABLET | Freq: Two times a day (BID) | ORAL | 0 refills | Status: DC

## 2022-10-06 NOTE — Progress Notes (Signed)
Chief Complaint:   OBESITY Annette Ellison is here to discuss her progress with her obesity treatment plan along with follow-up of her obesity related diagnoses. Annette Ellison is on the Category 2 Plan and states she is following her eating plan approximately 95% of the time. Annette Ellison states she is walking for 30 minutes 3 times per week.  Today's visit was #: 6 Starting weight: 175 LBS Starting date: 04/27/2022 Today's weight: 172 LBS Today's date: 09/23/2022 Total lbs lost to date: 3 LBS Total lbs lost since last in-office visit: 4 LBS  Interim History: Patient has been sticking to meal plan.  Has been having a [place] yogurt, fruits, oats, and apple for morning snack.  Patient brings a Healthy Choice meal to work and sometimes has a healthy afternoon snack. She  has been cooking dinners more at home.  Patient denies evening hunger.  Subjective:   1. Insulin resistance Fasting insulin 25.7.  Patient has reduced carbs and sugar intake.  Patient is added metformin 500 mg twice daily with food.  Patient complains of some loose stools otherwise well-tolerated.  2. Mild OSA (obstructive sleep apnea) Patient tested positive for mild OSA on HSS with Dr. Brett Fairy.  Patient is actively working on weight loss.  3. Vitamin D deficiency Vitamin D level 14.6 on 04/27/2022.  Patient is taking prescription vitamin D 50,000 IU weekly.  Assessment/Plan:   1. Insulin resistance Recheck fasting insulin, BMP, B12 next visit.  Refill- metFORMIN (GLUCOPHAGE) 500 MG tablet; Take 1 tablet (500 mg total) by mouth 2 (two) times daily with a meal.  Dispense: 30 tablet; Refill: 0  2. Mild OSA (obstructive sleep apnea) Anticipate resolution of sleep apnea with weight loss.  3. Vitamin D deficiency Recheck vitamin D level at next visit.  Refill- Vitamin D, Ergocalciferol, (DRISDOL) 1.25 MG (50000 UNIT) CAPS capsule; Take 1 capsule (50,000 Units total) by mouth every 7 (seven) days.  Dispense: 5 capsule; Refill: 0  4.  Generalized obesity  5. Obesity, current BMI 31.5 1.  Change from snacks or higher protein options given loss of muscle mass. 2.  Increase walking time to 4 days/week.  Refill- metFORMIN (GLUCOPHAGE) 500 MG tablet; Take 1 tablet (500 mg total) by mouth 2 (two) times daily with a meal.  Dispense: 30 tablet; Refill: 0  Annette Ellison is currently in the action stage of change. As such, her goal is to continue with weight loss efforts. She has agreed to the Category 2 Plan.   Exercise goals:  Increase walking to 3 to 4 days/week.  Behavioral modification strategies: increasing lean protein intake, increasing vegetables, increasing water intake, decreasing eating out, no skipping meals, meal planning and cooking strategies, keeping healthy foods in the home, and planning for success.  Annette Ellison has agreed to follow-up with our clinic in 4 weeks. She was informed of the importance of frequent follow-up visits to maximize her success with intensive lifestyle modifications for her multiple health conditions.   Objective:   Blood pressure 117/73, pulse 88, temperature 98.6 F (37 C), height 5' 2"$  (1.575 m), weight 172 lb (78 kg), SpO2 99 %. Body mass index is 31.46 kg/m.  General: Cooperative, alert, well developed, in no acute distress. HEENT: Conjunctivae and lids unremarkable. Cardiovascular: Regular rhythm.  Lungs: Normal work of breathing. Neurologic: No focal deficits.   Lab Results  Component Value Date   CREATININE 0.85 11/25/2021   BUN 14 11/25/2021   NA 137 11/25/2021   K 3.6 11/25/2021   CL 108 11/25/2021  CO2 23 11/25/2021   Lab Results  Component Value Date   ALT 27 11/25/2021   AST 18 11/25/2021   ALKPHOS 73 11/25/2021   BILITOT 0.4 11/25/2021   Lab Results  Component Value Date   HGBA1C 5.4 04/27/2022   Lab Results  Component Value Date   INSULIN 25.7 (H) 04/27/2022   Lab Results  Component Value Date   TSH 0.877 04/27/2022   Lab Results  Component Value Date    CHOL 220 (H) 04/27/2022   HDL 66 04/27/2022   LDLCALC 134 (H) 04/27/2022   TRIG 112 04/27/2022   CHOLHDL 3.3 04/27/2022   Lab Results  Component Value Date   VD25OH 14.6 (L) 04/27/2022   Lab Results  Component Value Date   WBC 8.2 04/27/2022   HGB 14.8 04/27/2022   HCT 45.6 04/27/2022   MCV 92 04/27/2022   PLT 272 04/27/2022   No results found for: "IRON", "TIBC", "FERRITIN"  Attestation Statements:   Reviewed by clinician on day of visit: allergies, medications, problem list, medical history, surgical history, family history, social history, and previous encounter notes.  I have personally spent 30 minutes total time today in preparation, patient care, nutritional counseling and documentation for this visit, including the following: review of clinical lab tests; review of medical tests/procedures/services.    I, Davy Pique, am acting as Location manager for Loyal Gambler, DO.  I have reviewed the above documentation for accuracy and completeness, and I agree with the above. Dell Ponto, DO

## 2022-10-20 ENCOUNTER — Encounter (INDEPENDENT_AMBULATORY_CARE_PROVIDER_SITE_OTHER): Payer: Self-pay | Admitting: Family Medicine

## 2022-10-20 ENCOUNTER — Ambulatory Visit (INDEPENDENT_AMBULATORY_CARE_PROVIDER_SITE_OTHER): Payer: 59 | Admitting: Family Medicine

## 2022-10-20 ENCOUNTER — Other Ambulatory Visit (INDEPENDENT_AMBULATORY_CARE_PROVIDER_SITE_OTHER): Payer: Self-pay | Admitting: Family Medicine

## 2022-10-20 VITALS — BP 134/76 | HR 94 | Temp 97.8°F | Ht 62.0 in | Wt 168.0 lb

## 2022-10-20 DIAGNOSIS — E88819 Insulin resistance, unspecified: Secondary | ICD-10-CM

## 2022-10-20 DIAGNOSIS — Z6830 Body mass index (BMI) 30.0-30.9, adult: Secondary | ICD-10-CM

## 2022-10-20 DIAGNOSIS — E559 Vitamin D deficiency, unspecified: Secondary | ICD-10-CM

## 2022-10-20 DIAGNOSIS — G43E09 Chronic migraine with aura, not intractable, without status migrainosus: Secondary | ICD-10-CM | POA: Diagnosis not present

## 2022-10-20 DIAGNOSIS — E669 Obesity, unspecified: Secondary | ICD-10-CM

## 2022-10-20 MED ORDER — METFORMIN HCL 500 MG PO TABS: 500.0000 mg | ORAL_TABLET | Freq: Two times a day (BID) | ORAL | 0 refills | Status: DC

## 2022-10-20 MED ORDER — VITAMIN D (ERGOCALCIFEROL) 1.25 MG (50000 UNIT) PO CAPS: 50000.0000 [IU] | ORAL_CAPSULE | ORAL | 0 refills | Status: DC

## 2022-10-20 NOTE — Assessment & Plan Note (Addendum)
Seeing Dr Domingo Cocking  She is currently on topiramate 400 mg once daily for migraine prevention along with Emgality.  She is still suffering with chronic daily headache.  Chronic daily headache has interfered with her ability to exercise.  She denies decrease in appetite from topiramate.  We reviewed dietary recommendations to help with migraine prevention.

## 2022-10-20 NOTE — Assessment & Plan Note (Signed)
Last vitamin D Lab Results  Component Value Date   VD25OH 14.6 (L) 04/27/2022   Last vitamin D level low.  She has been taking prescription vitamin D 50,000 IU once weekly since September.  Energy level remains fairly low.  Recheck vitamin D level today.

## 2022-10-20 NOTE — Progress Notes (Signed)
Office: [phone]  /  Fax: [phone]  WEIGHT SUMMARY AND BIOMETRICS  Vitals Temp: 97.8 F (36.6 C) BP: 134/76 Pulse Rate: 94 SpO2: 99 %   Anthropometric Measurements Height: '5\' 2"'$  (1.575 m) Weight: 168 lb (76.2 kg) BMI (Calculated): 30.72 Weight at Last Visit: 172lb Weight Lost Since Last Visit: 4lb Starting Weight: 175lb Total Weight Loss (lbs): 7 lb (3.175 kg)   Body Composition  Body Fat %: 37.2 % Fat Mass (lbs): 62.8 lbs Muscle Mass (lbs): 100.6 lbs Total Body Water (lbs): 67.8 lbs Visceral Fat Rating : 7   Other Clinical Data Fasting: yes Labs: yes Today's Visit #: 7 Starting Date: 04/27/22     HPI  Chief Complaint: OBESITY  Annette Ellison is here to discuss her progress with her obesity treatment plan. She is on the the Category 2 Plan and states she is following her eating plan approximately 100 % of the time. She states she is exercising 30 minutes 1-2 times per week.   Interval History:  Since last office visit she is down 4 lb She has been dealing with chronic migraines She plans to walk more in the Spring Denies meal skipping on cat 2 meal plan    Pharmacotherapy: metformin, on topiramate for migraines  PHYSICAL EXAM:  Blood pressure 134/76, pulse 94, temperature 97.8 F (36.6 C), height '5\' 2"'$  (1.575 m), weight 168 lb (76.2 kg), SpO2 99 %. Body mass index is 30.73 kg/m.  General: She is overweight, cooperative, alert, well developed, and in no acute distress. PSYCH: Has normal mood, affect and thought process.   Lungs: Normal breathing effort, no conversational dyspnea.  DIAGNOSTIC DATA REVIEWED:  BMET    Component Value Date/Time   NA 137 11/25/2021 0945   K 3.6 11/25/2021 0945   CL 108 11/25/2021 0945   CO2 23 11/25/2021 0945   GLUCOSE 98 11/25/2021 0945   BUN 14 11/25/2021 0945   CREATININE 0.85 11/25/2021 0945   CALCIUM 9.1 11/25/2021 0945   GFRNONAA >60 11/25/2021 0945   GFRAA >60 02/18/2016 0406   Lab Results   Component Value Date   HGBA1C 5.4 04/27/2022   Lab Results  Component Value Date   INSULIN 25.7 (H) 04/27/2022   Lab Results  Component Value Date   TSH 0.877 04/27/2022   CBC    Component Value Date/Time   WBC 8.2 04/27/2022 0834   WBC 12.5 (H) 11/25/2021 0945   RBC 4.97 04/27/2022 0834   RBC 5.21 (H) 11/25/2021 0945   HGB 14.8 04/27/2022 0834   HCT 45.6 04/27/2022 0834   PLT 272 04/27/2022 0834   MCV 92 04/27/2022 0834   MCH 29.8 04/27/2022 0834   MCH 30.5 11/25/2021 0945   MCHC 32.5 04/27/2022 0834   MCHC 33.9 11/25/2021 0945   RDW 12.4 04/27/2022 0834   Iron Studies No results found for: "IRON", "TIBC", "FERRITIN", "IRONPCTSAT" Lipid Panel     Component Value Date/Time   CHOL 220 (H) 04/27/2022 0834   TRIG 112 04/27/2022 0834   HDL 66 04/27/2022 0834   CHOLHDL 3.3 04/27/2022 0834   LDLCALC 134 (H) 04/27/2022 0834   Hepatic Function Panel     Component Value Date/Time   PROT 7.6 11/25/2021 0945   ALBUMIN 4.2 11/25/2021 0945   AST 18 11/25/2021 0945   ALT 27 11/25/2021 0945   ALKPHOS 73 11/25/2021 0945   BILITOT 0.4 11/25/2021 0945      Component Value Date/Time   TSH 0.877 04/27/2022 0834   Nutritional Lab  Results  Component Value Date   VD25OH 14.6 (L) 04/27/2022     ASSESSMENT AND PLAN  TREATMENT PLAN FOR OBESITY:  Recommended Dietary Goals  Annette Ellison is currently in the action stage of change. As such, her goal is to continue weight management plan. She has agreed to the Category 2 Plan.  Behavioral Intervention  We discussed the following Behavioral Modification Strategies today: increasing lean protein intake, increasing vegetables, increasing fiber rich foods, avoiding skipping meals, increasing water intake, work on meal planning and easy cooking plans, and decreasing sodium intake.  Additional resources provided today: NA  Recommended Physical Activity Goals  Annette Ellison has been advised to work up to 150 minutes of moderate intensity  aerobic activity a week and strengthening exercises 2-3 times per week for cardiovascular health, weight loss maintenance and preservation of muscle mass.   She has agreed to Patient also encouraged on scheduling and tracking physical activity.    Pharmacotherapy We discussed various medication options to help Annette Ellison with her weight loss efforts and we both agreed to metformin.  ASSOCIATED CONDITIONS ADDRESSED TODAY  Vitamin D deficiency Assessment & Plan: Last vitamin D Lab Results  Component Value Date   VD25OH 14.6 (L) 04/27/2022   Last vitamin D level low.  She has been taking prescription vitamin D 50,000 IU once weekly since September.  Energy level remains fairly low.  Recheck vitamin D level today.   Orders: -     Vitamin D (Ergocalciferol); Take 1 capsule (50,000 Units total) by mouth every 7 (seven) days.  Dispense: 5 capsule; Refill: 0 -     VITAMIN D 25 Hydroxy (Vit-D Deficiency, Fractures)  Insulin resistance Assessment & Plan: Recheck fasting insulin today.  Last fasting insulin elevated at 25.7 in September 2023.  She has reduced her intake of added sugar and is doing well on metformin 500 mg twice daily with food.  Plan increase walking time to 30 minutes 5 days a week for the treatment of insulin resistance.  Orders: -     metFORMIN HCl; Take 1 tablet (500 mg total) by mouth 2 (two) times daily with a meal.  Dispense: 60 tablet; Refill: 0 -     Comprehensive metabolic panel -     Insulin, random -     Vitamin B12  Chronic migraine with aura without status migrainosus, not intractable Assessment & Plan: Seeing Dr Domingo Cocking  She is currently on topiramate 400 mg once daily for migraine prevention along with Emgality.  She is still suffering with chronic daily headache.  Chronic daily headache has interfered with her ability to exercise.  She denies decrease in appetite from topiramate.  We reviewed dietary recommendations to help with migraine  prevention.   Generalized obesity Assessment & Plan: Improving Patient has a net weight loss of 7 pounds in the past 6 months of medically supervised weight management.  She is seeing maintenance of skeletal muscle mass with reduction of body fat.  Reviewed bioimpedance results together.  She is doing well on her prescribed meal plan.  Plan to increase exercise time as migraines improved.   BMI 30.0-30.9,adult      No follow-ups on file.Marland Kitchen She was informed of the importance of frequent follow up visits to maximize her success with intensive lifestyle modifications for her multiple health conditions.   ATTESTASTION STATEMENTS:  Reviewed by clinician on day of visit: allergies, medications, problem list, medical history, surgical history, family history, social history, and previous encounter notes pertinent to obesity diagnosis.  I have personally spent 30 minutes total time today in preparation, patient care, nutritional counseling and documentation for this visit, including the following: review of clinical lab tests; review of medical tests/procedures/services.      Dell Ponto, DO

## 2022-10-20 NOTE — Assessment & Plan Note (Signed)
Recheck fasting insulin today.  Last fasting insulin elevated at 25.7 in September 2023.  She has reduced her intake of added sugar and is doing well on metformin 500 mg twice daily with food.  Plan increase walking time to 30 minutes 5 days a week for the treatment of insulin resistance.

## 2022-10-20 NOTE — Assessment & Plan Note (Signed)
Improving Patient has a net weight loss of 7 pounds in the past 6 months of medically supervised weight management.  She is seeing maintenance of skeletal muscle mass with reduction of body fat.  Reviewed bioimpedance results together.  She is doing well on her prescribed meal plan.  Plan to increase exercise time as migraines improved.

## 2022-10-21 LAB — COMPREHENSIVE METABOLIC PANEL
ALT: 12 IU/L (ref 0–32)
AST: 11 IU/L (ref 0–40)
Albumin/Globulin Ratio: 1.7 (ref 1.2–2.2)
Albumin: 4.5 g/dL (ref 3.9–4.9)
Alkaline Phosphatase: 92 IU/L (ref 44–121)
BUN/Creatinine Ratio: 16 (ref 9–23)
BUN: 13 mg/dL (ref 6–20)
Bilirubin Total: <0.2 mg/dL (ref 0.0–1.2)
CO2: 16 mmol/L — ABNORMAL LOW (ref 20–29)
Calcium: 9.1 mg/dL (ref 8.7–10.2)
Chloride: 110 mmol/L — ABNORMAL HIGH (ref 96–106)
Creatinine, Ser: 0.8 mg/dL (ref 0.57–1.00)
Globulin, Total: 2.6 g/dL (ref 1.5–4.5)
Glucose: 74 mg/dL (ref 70–99)
Potassium: 4.1 mmol/L (ref 3.5–5.2)
Sodium: 149 mmol/L — ABNORMAL HIGH (ref 134–144)
Total Protein: 7.1 g/dL (ref 6.0–8.5)
eGFR: 98 mL/min/{1.73_m2} (ref >59)

## 2022-10-21 LAB — INSULIN, RANDOM: INSULIN: 18.8 u[IU]/mL (ref 2.6–24.9)

## 2022-10-21 LAB — VITAMIN D 25 HYDROXY (VIT D DEFICIENCY, FRACTURES): Vit D, 25-Hydroxy: 39.1 ng/mL (ref 30.0–100.0)

## 2022-10-21 LAB — VITAMIN B12: Vitamin B-12: 399 pg/mL (ref 232–1245)

## 2022-11-24 ENCOUNTER — Ambulatory Visit (INDEPENDENT_AMBULATORY_CARE_PROVIDER_SITE_OTHER): Payer: 59 | Admitting: Family Medicine

## 2022-11-24 ENCOUNTER — Encounter (INDEPENDENT_AMBULATORY_CARE_PROVIDER_SITE_OTHER): Payer: Self-pay | Admitting: Family Medicine

## 2022-11-24 VITALS — BP 122/90 | HR 75 | Temp 98.3°F | Ht 62.0 in | Wt 167.0 lb

## 2022-11-24 DIAGNOSIS — E669 Obesity, unspecified: Secondary | ICD-10-CM

## 2022-11-24 DIAGNOSIS — E88819 Insulin resistance, unspecified: Secondary | ICD-10-CM

## 2022-11-24 DIAGNOSIS — E538 Deficiency of other specified B group vitamins: Secondary | ICD-10-CM | POA: Diagnosis not present

## 2022-11-24 DIAGNOSIS — E559 Vitamin D deficiency, unspecified: Secondary | ICD-10-CM | POA: Diagnosis not present

## 2022-11-24 DIAGNOSIS — Z6830 Body mass index (BMI) 30.0-30.9, adult: Secondary | ICD-10-CM

## 2022-11-24 MED ORDER — METFORMIN HCL ER 750 MG PO TB24: 750.0000 mg | ORAL_TABLET | Freq: Every day | ORAL | 0 refills | Status: DC

## 2022-11-24 MED ORDER — VITAMIN D (ERGOCALCIFEROL) 1.25 MG (50000 UNIT) PO CAPS: 50000.0000 [IU] | ORAL_CAPSULE | ORAL | 0 refills | Status: DC

## 2022-11-24 NOTE — Progress Notes (Signed)
Office: [phone]  /  Fax: [phone]  WEIGHT SUMMARY AND BIOMETRICS  Starting Date: 04/27/22  Starting Weight: 175lb   Weight Lost Since Last Visit: 1lb   Vitals Temp: 98.3 F (36.8 C) BP: (!) 122/90 Pulse Rate: 75 SpO2: 99 %   Body Composition  Body Fat %: 38.2 % Fat Mass (lbs): 64 lbs Muscle Mass (lbs): 98.2 lbs Total Body Water (lbs): 68.6 lbs Visceral Fat Rating : 7   HPI  Chief Complaint: OBESITY  Annette Ellison is here to discuss her progress with her obesity treatment plan. She is on the the Category 2 Plan and states she is following her eating plan approximately 100 % of the time. She states she is exercising 0 minutes 0 times per week.  Interval History:  Since last office visit she is down 1 lb She has been craving more sweets since traveling to [place] and eating more candy She has more stress at work She has a net weight loss of 8 lb in the past 7 mos She hasn't been able to add in exercise since coming back from trip She plans to walk while her kids are at soccer practice She is good about drinking more water Her migraines have improved  Pharmacotherapy: metformin 500 mg bid  PHYSICAL EXAM:  Blood pressure (!) 122/90, pulse 75, temperature 98.3 F (36.8 C), height 5\' 2"  (1.575 m), weight 167 lb (75.8 kg), SpO2 99 %. Body mass index is 30.54 kg/m.  General: She is overweight, cooperative, alert, well developed, and in no acute distress. PSYCH: Has normal mood, affect and thought process.   Lungs: Normal breathing effort, no conversational dyspnea.   ASSESSMENT AND PLAN  TREATMENT PLAN FOR OBESITY:  Recommended Dietary Goals  [name] is currently in the action stage of change. As such, her goal is to continue weight management plan. She has agreed to the Category 2 Plan.  Behavioral Intervention  We discussed the following Behavioral Modification Strategies today: increasing lean protein intake, increasing vegetables, increasing fiber  rich foods, increasing water intake, work on meal planning and preparation, reading food labels , emotional eating strategies and understanding the difference between hunger signals and cravings, and planning for success.  Additional resources provided today: NA  Recommended Physical Activity Goals  [name] has been advised to work up to 150 minutes of moderate intensity aerobic activity a week and strengthening exercises 2-3 times per week for cardiovascular health, weight loss maintenance and preservation of muscle mass.   She has agreed to Start aerobic activity with a goal of 150 minutes a week at moderate intensity.   Pharmacotherapy changes for the treatment of obesity: change Metformin XR to 750 mg daily with food  ASSOCIATED CONDITIONS ADDRESSED TODAY  Vitamin D deficiency Assessment & Plan: Last vitamin D Lab Results  Component Value Date   VD25OH 39.1 10/20/2022   Reviewed lab from last visit.  Vitamin D level is improving from 14.6 to 39.1.  energy level still pretty low.  Target range 50-70.  Continue RX vitamin D 50,000 IU weekly.  Recheck lab in 4 mos  Orders: -     Vitamin D (Ergocalciferol); Take 1 capsule (50,000 Units total) by mouth every 7 (seven) days.  Dispense: 5 capsule; Refill: 0  Insulin resistance Assessment & Plan: She is having diarrhea from metformin 500 mg bid. Fasting insulin is improving from 25.7 to 18.8.  she has recently increased her intake of sweets.  Plan: change metformin to metformin XR 750 mg once  daily with food. Continue to work on prescribed meal plan, reducing sugar intake Track daily protein intake to ensure 85+ g/ day.   Orders: -     metFORMIN HCl ER; Take 1 tablet (750 mg total) by mouth daily with breakfast.  Dispense: 30 tablet; Refill: 0  Generalized obesity  Low serum vitamin B12 Assessment & Plan: Reviewed lab from last visit.  B12 improving from 350 to 399.  Started a women's MVI in Feb. Denies paresthesias but has  fatigue.   Continue a women's MVI daily.   BMI 30.0-30.9,adult      She was informed of the importance of frequent follow up visits to maximize her success with intensive lifestyle modifications for her multiple health conditions.   ATTESTASTION STATEMENTS:  Reviewed by clinician on day of visit: allergies, medications, problem list, medical history, surgical history, family history, social history, and previous encounter notes pertinent to obesity diagnosis.   I have personally spent 30 minutes total time today in preparation, patient care, nutritional counseling and documentation for this visit, including the following: review of clinical lab tests; review of medical tests/procedures/services.      Glennis Brink, DO DABFM, DABOM [facility] [address] [place], [place] [address] [phone]

## 2022-11-24 NOTE — Assessment & Plan Note (Signed)
Last vitamin D Lab Results  Component Value Date   VD25OH 39.1 10/20/2022   Reviewed lab from last visit.  Vitamin D level is improving from 14.6 to 39.1.  energy level still pretty low.  Target range 50-70.  Continue RX vitamin D 50,000 IU weekly.  Recheck lab in 4 mos

## 2022-11-24 NOTE — Assessment & Plan Note (Signed)
Reviewed lab from last visit.  B12 improving from 350 to 399.  Started a women's MVI in Feb. Denies paresthesias but has fatigue.   Continue a women's MVI daily.

## 2022-11-24 NOTE — Assessment & Plan Note (Addendum)
She is having diarrhea from metformin 500 mg bid. Fasting insulin is improving from 25.7 to 18.8.  she has recently increased her intake of sweets.  Plan: change metformin to metformin XR 750 mg once daily with food. Continue to work on prescribed meal plan, reducing sugar intake Track daily protein intake to ensure 85+ g/ day.

## 2022-12-24 ENCOUNTER — Encounter (INDEPENDENT_AMBULATORY_CARE_PROVIDER_SITE_OTHER): Payer: Self-pay | Admitting: Family Medicine

## 2022-12-24 ENCOUNTER — Ambulatory Visit (INDEPENDENT_AMBULATORY_CARE_PROVIDER_SITE_OTHER): Payer: 59 | Admitting: Family Medicine

## 2022-12-24 VITALS — BP 106/73 | HR 75 | Temp 98.5°F | Ht 62.0 in | Wt 163.0 lb

## 2022-12-24 DIAGNOSIS — E669 Obesity, unspecified: Secondary | ICD-10-CM | POA: Diagnosis not present

## 2022-12-24 DIAGNOSIS — E88819 Insulin resistance, unspecified: Secondary | ICD-10-CM | POA: Diagnosis not present

## 2022-12-24 DIAGNOSIS — R197 Diarrhea, unspecified: Secondary | ICD-10-CM

## 2022-12-24 DIAGNOSIS — E559 Vitamin D deficiency, unspecified: Secondary | ICD-10-CM

## 2022-12-24 DIAGNOSIS — Z6829 Body mass index (BMI) 29.0-29.9, adult: Secondary | ICD-10-CM

## 2022-12-24 MED ORDER — METFORMIN HCL ER 750 MG PO TB24: 750.0000 mg | ORAL_TABLET | Freq: Every day | ORAL | 0 refills | Status: DC

## 2022-12-24 MED ORDER — VITAMIN D (ERGOCALCIFEROL) 1.25 MG (50000 UNIT) PO CAPS: 50000.0000 [IU] | ORAL_CAPSULE | ORAL | 0 refills | Status: DC

## 2022-12-24 NOTE — Assessment & Plan Note (Addendum)
Last vitamin D Lab Results  Component Value Date   VD25OH 39.1 10/20/2022   She is doing well on RX vitamin D 50,000 IU weekly Energy level is improving Denies adverse SE  Recheck vitamin D level in 2 mos

## 2022-12-24 NOTE — Assessment & Plan Note (Signed)
Fasting insulin is improving with a low sugar/ low starch diet, weight loss and regular exercise Her metformin was changed from 500 mg bid to metformin XR 750 mg once daily Tolerating well  Continue prescribed diet and metformin XR 750 mg XR daily with food Recheck fasting insulin, B12, CMP in 2 mos

## 2022-12-24 NOTE — Assessment & Plan Note (Signed)
She reports loose stools ever since her trip to [place] this Spring Denies fevers, blood in stool, nausea, vomiting or abdominal pain Loose stools seem sporadic and not triggered by certain foods or use of metformin She is hydrating well  Recommend close follow up by PCP for stool testing Hydrate with both water and sugar free electrolytes

## 2022-12-24 NOTE — Assessment & Plan Note (Signed)
Improving Reviewed bioimpedence results She is losing more body fat than muscle mass and hunger is under good control Adding in regular walking has helped with further body fat % reduction  Aim for 30 min of walking 5 days/ wk Continue cat 2 meal plan

## 2022-12-24 NOTE — Progress Notes (Signed)
Office: [phone]  /  Fax: [phone]  WEIGHT SUMMARY AND BIOMETRICS  Starting Date: 04/27/22  Starting Weight: 175lb   Weight Lost Since Last Visit: 4lb   Vitals Temp: 98.5 F (36.9 C) BP: 106/73 Pulse Rate: 75 SpO2: 99 %   Body Composition  Body Fat %: 36.9 % Fat Mass (lbs): 60.2 lbs Muscle Mass (lbs): 97.8 lbs Total Body Water (lbs): 67.6 lbs Visceral Fat Rating : 7     HPI  Chief Complaint: OBESITY  Charlanne is here to discuss her progress with her obesity treatment plan. She is on the category 2  and states she is following her eating plan approximately 90 % of the time. She states she is exercising 30-60 minutes 3 times per week.   Interval History:  Since last office visit she is down 4 lb She lost 3.8 lb of body fat She did change metformin from 500 mg to XR 750 mg daily with food She has had loose stools since coming back from [place] Work has been stressful  She is walking during [employer] She is adequately full on meal plan Denies cravings   Pharmacotherapy: metformin XR 750 mg daily for IR  PHYSICAL EXAM:  Blood pressure 106/73, pulse 75, temperature 98.5 F (36.9 C), height 5\' 2"  (1.575 m), weight 163 lb (73.9 kg), SpO2 99 %. Body mass index is 29.81 kg/m.  General: She is overweight, cooperative, alert, well developed, and in no acute distress. PSYCH: Has normal mood, affect and thought process.   Lungs: Normal breathing effort, no conversational dyspnea.   ASSESSMENT AND PLAN  TREATMENT PLAN FOR OBESITY:  Recommended Dietary Goals  Ciara is currently in the action stage of change. As such, her goal is to continue weight management plan. She has agreed to the Category 2 Plan.  Behavioral Intervention  We discussed the following Behavioral Modification Strategies today: increasing lean protein intake, decreasing simple carbohydrates , increasing vegetables, increasing lower glycemic fruits, increasing water intake,  work on meal planning and preparation, work on managing stress, creating time for self-care and relaxation measures, continue to practice mindfulness when eating, and planning for success.  Additional resources provided today: NA  Recommended Physical Activity Goals  Naomy has been advised to work up to 150 minutes of moderate intensity aerobic activity a week and strengthening exercises 2-3 times per week for cardiovascular health, weight loss maintenance and preservation of muscle mass.   She has agreed to Increase the intensity, frequency or duration of aerobic exercises    Pharmacotherapy changes for the treatment of obesity: none  ASSOCIATED CONDITIONS ADDRESSED TODAY  Insulin resistance Assessment & Plan: Fasting insulin is improving with a low sugar/ low starch diet, weight loss and regular exercise Her metformin was changed from 500 mg bid to metformin XR 750 mg once daily Tolerating well  Continue prescribed diet and metformin XR 750 mg XR daily with food Recheck fasting insulin, B12, CMP in 2 mos  Orders: -     metFORMIN HCl ER; Take 1 tablet (750 mg total) by mouth daily with breakfast.  Dispense: 30 tablet; Refill: 0  Vitamin D deficiency Assessment & Plan: Last vitamin D Lab Results  Component Value Date   VD25OH 39.1 10/20/2022   She is doing well on RX vitamin D 50,000 IU weekly Energy level is improving Denies adverse SE  Recheck vitamin D level in 2 mos  Orders: -     Vitamin D (Ergocalciferol); Take 1 capsule (50,000 Units total)  by mouth every 7 (seven) days.  Dispense: 5 capsule; Refill: 0  Generalized obesity with starting BMI 32 Assessment & Plan: Improving Reviewed bioimpedence results She is losing more body fat than muscle mass and hunger is under good control Adding in regular walking has helped with further body fat % reduction  Aim for 30 min of walking 5 days/ wk Continue cat 2 meal plan   BMI 29.0-29.9,adult  Diarrhea of presumed  infectious origin Assessment & Plan: She reports loose stools ever since her trip to [place] this Spring Denies fevers, blood in stool, nausea, vomiting or abdominal pain Loose stools seem sporadic and not triggered by certain foods or use of metformin She is hydrating well  Recommend close follow up by PCP for stool testing Hydrate with both water and sugar free electrolytes       She was informed of the importance of frequent follow up visits to maximize her success with intensive lifestyle modifications for her multiple health conditions.   ATTESTASTION STATEMENTS:  Reviewed by clinician on day of visit: allergies, medications, problem list, medical history, surgical history, family history, social history, and previous encounter notes pertinent to obesity diagnosis.   I have personally spent 30 minutes total time today in preparation, patient care, nutritional counseling and documentation for this visit, including the following: review of clinical lab tests; review of medical tests/procedures/services.      Glennis Brink, DO DABFM, DABOM [facility] [address] [place], [place] [address] [phone]

## 2023-02-01 ENCOUNTER — Ambulatory Visit (INDEPENDENT_AMBULATORY_CARE_PROVIDER_SITE_OTHER): Payer: 59 | Admitting: Family Medicine

## 2023-02-01 ENCOUNTER — Encounter (INDEPENDENT_AMBULATORY_CARE_PROVIDER_SITE_OTHER): Payer: Self-pay | Admitting: Family Medicine

## 2023-02-01 VITALS — BP 114/70 | HR 64 | Temp 97.6°F | Ht 62.0 in | Wt 167.0 lb

## 2023-02-01 DIAGNOSIS — Z6830 Body mass index (BMI) 30.0-30.9, adult: Secondary | ICD-10-CM

## 2023-02-01 DIAGNOSIS — R110 Nausea: Secondary | ICD-10-CM | POA: Diagnosis not present

## 2023-02-01 DIAGNOSIS — E88819 Insulin resistance, unspecified: Secondary | ICD-10-CM

## 2023-02-01 DIAGNOSIS — E669 Obesity, unspecified: Secondary | ICD-10-CM

## 2023-02-01 DIAGNOSIS — E559 Vitamin D deficiency, unspecified: Secondary | ICD-10-CM

## 2023-02-01 DIAGNOSIS — R632 Polyphagia: Secondary | ICD-10-CM

## 2023-02-01 MED ORDER — LOMAIRA 8 MG PO TABS
ORAL_TABLET | ORAL | 0 refills | Status: DC
Start: 2023-02-01 — End: 2023-03-04

## 2023-02-01 MED ORDER — METFORMIN HCL ER 750 MG PO TB24: 750.0000 mg | ORAL_TABLET | Freq: Every day | ORAL | 0 refills | Status: DC

## 2023-02-01 MED ORDER — VITAMIN D (ERGOCALCIFEROL) 1.25 MG (50000 UNIT) PO CAPS: 50000.0000 [IU] | ORAL_CAPSULE | ORAL | 0 refills | Status: DC

## 2023-02-01 NOTE — Assessment & Plan Note (Signed)
Working on a low sugar/ low starch diet with her prescribed meal plan Has increased exercise to 3 x a week for insulin sensitivity Last fasting insulin high at 18.8 Tolerating metformin XR 750 mg once daily and does not feel like this contributed to her diarrhea and nausea that she has had on and off for a few weeks.    Repeat labs today Resume prescribed meal plan Add in weight training 2 x a week

## 2023-02-01 NOTE — Assessment & Plan Note (Signed)
Reviewed her overall progress, down 8 lb in the past 9 mos of medically supervised weight management.  This is a 4.5% TBW loss. She has gained 4 lb of body fat in the past month since another life stressor occurred.  She is on Cymbalta for mood and reports a good support system at home.  She was on Lomaira in the past but stopped because her BP/ HR increased at the same time she was started on a new sleeping medicine (which she is no longer on).  She denies any big issues with anxiety currently and her BP / HR are WNL.  Resume cat 1 meal plan Plan to repeat IC test in the next 2-3 mos Ramp up exercise to 3 x a week

## 2023-02-01 NOTE — Assessment & Plan Note (Signed)
Last vitamin D Lab Results  Component Value Date   VD25OH 39.1 10/20/2022   She has done well on RX vitamin D 50,000 international units weekly.  Energy level is stable Repeat vitamin D level today

## 2023-02-01 NOTE — Progress Notes (Signed)
Office: [phone]  /  Fax: [phone]  WEIGHT SUMMARY AND BIOMETRICS  Starting Date: 04/27/22  Starting Weight: 175lb   Weight Lost Since Last Visit: 0lb   Vitals Temp: 97.6 F (36.4 C) BP: 114/70 Pulse Rate: 64 SpO2: 98 %   Body Composition  Body Fat %: 38.4 % Fat Mass (lbs): 64.2 lbs Muscle Mass (lbs): 98 lbs Total Body Water (lbs): 70.6 lbs Visceral Fat Rating : 7     HPI  Chief Complaint: OBESITY  Annette Ellison is here to discuss her progress with her obesity treatment plan. She is on the the Category 1 Plan and states she is following her eating plan approximately 75 % of the time. She states she is exercising 60 minutes 2 times per week.   Interval History:  Since last office visit she is up 4 lb She is up 4 lb of body fat in the past month She had a death in the family She is rarely eating out but feels like she is eating more in between meals and is doing stress eating.  She is craving more sweets than starches She had a GI bug since her last visit and has some Zofran for prn use and Omeprazole daily She has been walking more at the park in the evenings   Pharmacotherapy: metformin XR 750 mg daily for IR  PHYSICAL EXAM:  Blood pressure 114/70, pulse 64, temperature 97.6 F (36.4 C), height 5\' 2"  (1.575 m), weight 167 lb (75.8 kg), SpO2 98 %. Body mass index is 30.54 kg/m.  General: She is overweight, cooperative, alert, well developed, and in no acute distress. PSYCH: Has normal mood, affect and thought process.   Lungs: Normal breathing effort, no conversational dyspnea.   ASSESSMENT AND PLAN  TREATMENT PLAN FOR OBESITY:  Recommended Dietary Goals  Annette Ellison is currently in the action stage of change. As such, her goal is to continue weight management plan. She has agreed to the Category 2 Plan.  Behavioral Intervention  We discussed the following Behavioral Modification Strategies today: increasing lean protein intake, decreasing simple  carbohydrates , increasing vegetables, increasing lower glycemic fruits, increasing water intake, work on meal planning and preparation, keeping healthy foods at home, work on managing stress, creating time for self-care and relaxation measures, avoiding temptations and identifying enticing environmental cues, continue to practice mindfulness when eating, and planning for success.  Additional resources provided today: NA  Recommended Physical Activity Goals  Annette Ellison has been advised to work up to 150 minutes of moderate intensity aerobic activity a week and strengthening exercises 2-3 times per week for cardiovascular health, weight loss maintenance and preservation of muscle mass.   She has agreed to Think about ways to increase daily physical activity and overcoming barriers to exercise  Pharmacotherapy changes for the treatment of obesity: restart Lomaira 8 mg bid #60, no RF  ASSOCIATED CONDITIONS ADDRESSED TODAY  Polyphagia Assessment & Plan: She is frustrated by her slow weight loss and felt like Lomaira did help in the past.  Cautioned her that her previous issue with elevated HR and BP could recur on Lomaira but she is no longer on the 'sleeping medicine' that she thinks triggered her reaction.  We reviewed potential adverse SE.  She will resume cat 2 (not 1) meal plan and ramp up her workouts to 3 days/ wk.  Keep junk food triggers out of sight and focus on lean protein + fiber with meals and snacks.    Restart Lomaira 8 mg tab -  take 1 tab 30 min before breakfast and 1 tab at 2 pm daily Increase water intake to 80 oz/ day  Orders: -     Lomaira; 1 tab po 30 min before breakfast and 1 tab at 2 pm daily  Dispense: 60 tablet; Refill: 0  Vitamin D deficiency Assessment & Plan: Last vitamin D Lab Results  Component Value Date   VD25OH 39.1 10/20/2022   She has done well on RX vitamin D 50,000 international units weekly.  Energy level is stable Repeat vitamin D level  today    Orders: -     Vitamin D (Ergocalciferol); Take 1 capsule (50,000 Units total) by mouth every 7 (seven) days.  Dispense: 5 capsule; Refill: 0 -     VITAMIN D 25 Hydroxy (Vit-D Deficiency, Fractures)  Insulin resistance Assessment & Plan: Working on a low sugar/ low starch diet with her prescribed meal plan Has increased exercise to 3 x a week for insulin sensitivity Last fasting insulin high at 18.8 Tolerating metformin XR 750 mg once daily and does not feel like this contributed to her diarrhea and nausea that she has had on and off for a few weeks.    Repeat labs today Resume prescribed meal plan Add in weight training 2 x a week  Orders: -     metFORMIN HCl ER; Take 1 tablet (750 mg total) by mouth daily with breakfast.  Dispense: 30 tablet; Refill: 0 -     Insulin, random -     Vitamin B12 -     Hemoglobin A1c -     Comprehensive metabolic panel  Generalized obesity with starting BMI 32 Assessment & Plan: Reviewed her overall progress, down 8 lb in the past 9 mos of medically supervised weight management.  This is a 4.5% TBW loss. She has gained 4 lb of body fat in the past month since another life stressor occurred.  She is on Cymbalta for mood and reports a good support system at home.  She was on Lomaira in the past but stopped because her BP/ HR increased at the same time she was started on a new sleeping medicine (which she is no longer on).  She denies any big issues with anxiety currently and her BP / HR are WNL.  Resume cat 1 meal plan Plan to repeat IC test in the next 2-3 mos Ramp up exercise to 3 x a week    BMI 30.0-30.9,adult  Nausea Assessment & Plan: She was prescribed Zofran ODT 8 mg q hr prn  and Omeprazole 40 mg daily for dyspepsia following a gastroenteritis diagnosis.  The nausea is only now occurring with consumption of pork and beef.  She denies a hx of tick bite or a hx of alpha- gal.    For now, continue Omeprazole 40 mg at  bedtime. Hydrate well with water and sugar free electrolytes Limit dairy and avoid greasy foods. Avoid pork and beef and I would recommend testing for alpha gal by GI -- she agrees to call.       She was informed of the importance of frequent follow up visits to maximize her success with intensive lifestyle modifications for her multiple health conditions.   ATTESTASTION STATEMENTS:  Reviewed by clinician on day of visit: allergies, medications, problem list, medical history, surgical history, family history, social history, and previous encounter notes pertinent to obesity diagnosis.   I have personally spent 30 minutes total time today in preparation, patient care, nutritional counseling and  documentation for this visit, including the following: review of clinical lab tests; review of medical tests/procedures/services.      Glennis Brink, DO DABFM, DABOM [facility] [address] [place], [place] [address] [phone]

## 2023-02-01 NOTE — Assessment & Plan Note (Signed)
She is frustrated by her slow weight loss and felt like Lomaira did help in the past.  Cautioned her that her previous issue with elevated HR and BP could recur on Lomaira but she is no longer on the 'sleeping medicine' that she thinks triggered her reaction.  We reviewed potential adverse SE.  She will resume cat 2 (not 1) meal plan and ramp up her workouts to 3 days/ wk.  Keep junk food triggers out of sight and focus on lean protein + fiber with meals and snacks.    Restart Lomaira 8 mg tab - take 1 tab 30 min before breakfast and 1 tab at 2 pm daily Increase water intake to 80 oz/ day

## 2023-02-01 NOTE — Assessment & Plan Note (Signed)
She was prescribed Zofran ODT 8 mg q hr prn  and Omeprazole 40 mg daily for dyspepsia following a gastroenteritis diagnosis.  The nausea is only now occurring with consumption of pork and beef.  She denies a hx of tick bite or a hx of alpha- gal.    For now, continue Omeprazole 40 mg at bedtime. Hydrate well with water and sugar free electrolytes Limit dairy and avoid greasy foods. Avoid pork and beef and I would recommend testing for alpha gal by GI -- she agrees to call.

## 2023-02-02 LAB — COMPREHENSIVE METABOLIC PANEL
ALT: 12 IU/L (ref 0–32)
AST: 10 IU/L (ref 0–40)
Albumin: 4.2 g/dL (ref 3.9–4.9)
Alkaline Phosphatase: 108 IU/L (ref 44–121)
BUN/Creatinine Ratio: 21 (ref 9–23)
BUN: 14 mg/dL (ref 6–20)
Bilirubin Total: <0.2 mg/dL (ref 0.0–1.2)
CO2: 20 mmol/L (ref 20–29)
Calcium: 9.3 mg/dL (ref 8.7–10.2)
Chloride: 108 mmol/L — ABNORMAL HIGH (ref 96–106)
Creatinine, Ser: 0.67 mg/dL (ref 0.57–1.00)
Globulin, Total: 2.5 g/dL (ref 1.5–4.5)
Glucose: 84 mg/dL (ref 70–99)
Potassium: 4.2 mmol/L (ref 3.5–5.2)
Sodium: 140 mmol/L (ref 134–144)
Total Protein: 6.7 g/dL (ref 6.0–8.5)
eGFR: 116 mL/min/{1.73_m2} (ref >59)

## 2023-02-02 LAB — VITAMIN B12: Vitamin B-12: 333 pg/mL (ref 232–1245)

## 2023-02-02 LAB — HEMOGLOBIN A1C
Est. average glucose Bld gHb Est-mCnc: 103 mg/dL
Hgb A1c MFr Bld: 5.2 % (ref 4.8–5.6)

## 2023-02-02 LAB — VITAMIN D 25 HYDROXY (VIT D DEFICIENCY, FRACTURES): Vit D, 25-Hydroxy: 32.5 ng/mL (ref 30.0–100.0)

## 2023-02-02 LAB — INSULIN, RANDOM: INSULIN: 23 u[IU]/mL (ref 2.6–24.9)

## 2023-03-04 ENCOUNTER — Encounter (INDEPENDENT_AMBULATORY_CARE_PROVIDER_SITE_OTHER): Payer: Self-pay | Admitting: Family Medicine

## 2023-03-04 ENCOUNTER — Ambulatory Visit (INDEPENDENT_AMBULATORY_CARE_PROVIDER_SITE_OTHER): Payer: 59 | Admitting: Family Medicine

## 2023-03-04 VITALS — BP 122/85 | HR 93 | Temp 98.7°F | Ht 62.0 in | Wt 165.0 lb

## 2023-03-04 DIAGNOSIS — E7849 Other hyperlipidemia: Secondary | ICD-10-CM

## 2023-03-04 DIAGNOSIS — E88819 Insulin resistance, unspecified: Secondary | ICD-10-CM | POA: Diagnosis not present

## 2023-03-04 DIAGNOSIS — E669 Obesity, unspecified: Secondary | ICD-10-CM

## 2023-03-04 DIAGNOSIS — R632 Polyphagia: Secondary | ICD-10-CM | POA: Diagnosis not present

## 2023-03-04 DIAGNOSIS — E559 Vitamin D deficiency, unspecified: Secondary | ICD-10-CM

## 2023-03-04 DIAGNOSIS — Z6830 Body mass index (BMI) 30.0-30.9, adult: Secondary | ICD-10-CM

## 2023-03-04 DIAGNOSIS — E668 Other obesity: Secondary | ICD-10-CM

## 2023-03-04 DIAGNOSIS — E538 Deficiency of other specified B group vitamins: Secondary | ICD-10-CM

## 2023-03-04 MED ORDER — METFORMIN HCL ER 750 MG PO TB24: 750.0000 mg | ORAL_TABLET | Freq: Every day | ORAL | 0 refills | Status: DC

## 2023-03-04 MED ORDER — VITAMIN D (ERGOCALCIFEROL) 1.25 MG (50000 UNIT) PO CAPS
50000.0000 [IU] | ORAL_CAPSULE | ORAL | 0 refills | Status: DC
Start: 2023-03-04 — End: 2023-03-25

## 2023-03-04 MED ORDER — VITAMIN B-12 500 MCG SL SUBL
SUBLINGUAL_TABLET | SUBLINGUAL | 6 refills | Status: DC
Start: 2023-03-04 — End: 2024-06-09

## 2023-03-04 MED ORDER — LOMAIRA 8 MG PO TABS
ORAL_TABLET | ORAL | 0 refills | Status: DC
Start: 2023-03-04 — End: 2023-03-25

## 2023-03-04 NOTE — Progress Notes (Signed)
Office: [phone]  /  Fax: [phone]  WEIGHT SUMMARY AND BIOMETRICS  Starting Date: 04/27/22  Starting Weight: 175lb   Weight Lost Since Last Visit: 2lb   Vitals Temp: 98.7 F (37.1 C) BP: 122/85 Pulse Rate: 93 SpO2: 99 %   Body Composition  Body Fat %: 36.8 % Fat Mass (lbs): 61 lbs Muscle Mass (lbs): 99.2 lbs Total Body Water (lbs): 68.4 lbs Visceral Fat Rating : 7     HPI  Chief Complaint: OBESITY  Annette Ellison is here to discuss her progress with her obesity treatment plan. She is on the the Category 1 Plan and states she is following her eating plan approximately 100 % of the time. She states she is doing sit up's , jumping jacks, wall squats and planks 30 minutes 5 times per week.   Interval History:  Since last office visit she is down 2 lb  She is up 1.2 lb of muscle mass and is down 3.2 lb of body fat She is back on Lomaira 8 mg twice daily for appetite control without adverse SE She is getting in all of the food on category 2 meal plan She has a net weight loss of 10 lb in the past 10 mos This is a 5.7% TBW loss She denies sugar cravings She is working out and tracking steps on a program with work   Pharmacotherapy: Lomaira 8 mg bid  PHYSICAL EXAM:  Blood pressure 122/85, pulse 93, temperature 98.7 F (37.1 C), height 5\' 2"  (1.575 m), weight 165 lb (74.8 kg), SpO2 99%. Body mass index is 30.18 kg/m.  General: She is overweight, cooperative, alert, well developed, and in no acute distress. PSYCH: Has normal mood, affect and thought process.   Lungs: Normal breathing effort, no conversational dyspnea.   ASSESSMENT AND PLAN  TREATMENT PLAN FOR OBESITY:  Recommended Dietary Goals  Annette Ellison is currently in the action stage of change. As such, her goal is to continue weight management plan. She has agreed to the Category 2 Plan. SHE HAS BEEN ON CAT 2 MEAL PLAN, NOT CAT 1 REVIEWED USE OF CHAT CPT FOR HEALTHY MEAL/ SNACK OPTIONS TO ADD VARIETY AND  STAY IN CALORIE LIMIT  Behavioral Intervention  We discussed the following Behavioral Modification Strategies today: increasing lean protein intake, decreasing simple carbohydrates , increasing vegetables, increasing lower glycemic fruits, increasing fiber rich foods, avoiding skipping meals, increasing water intake, keeping healthy foods at home, continue to practice mindfulness when eating, planning for success, and better snacking choices.  Additional resources provided today: NA  Recommended Physical Activity Goals  Annette Ellison has been advised to work up to 150 minutes of moderate intensity aerobic activity a week and strengthening exercises 2-3 times per week for cardiovascular health, weight loss maintenance and preservation of muscle mass.   She has agreed to Increase the intensity, frequency or duration of aerobic exercises   KEEP TRACKING DAILY STEPS ADD IN RESISTANCE TRAINING EXERCISES FROM HOME 3 X A WEEK  Pharmacotherapy changes for the treatment of obesity: NONE  ASSOCIATED CONDITIONS ADDRESSED TODAY  Polyphagia Assessment & Plan: She is not skipping meals.  She is focused on lean protein and fiber with mealsImproving on Lomaira 8 mg tab twice daily.  Blood pressure and heart rate are well-controlled.  She denies adverse side effects and does feel improved appetite control.  She is getting in lean protein and fiber with meals without over snacking.  Continue Lomaira 8 mg tab bid.  Reaching target goal of 2+ lb  of weight loss per month Avoid pregnancy while on Lomaira  Orders: -     Lomaira; 1 tab po 30 min before breakfast and 1 tab at 2 pm daily  Dispense: 60 tablet; Refill: 0  Generalized obesity with starting BMI 32  Insulin resistance Assessment & Plan: Last fasting insulin elevated at 23, up from 18.  Currently on metformin XR 750 mg daily.  Tolerating well without adverse SE.  Working on reducing intake of starches and sweets.  Added in 3 days / wk of exercise.     Orders: -     metFORMIN HCl ER; Take 1 tablet (750 mg total) by mouth daily with breakfast.  Dispense: 30 tablet; Refill: 0 -     Insulin, random  Vitamin D deficiency Assessment & Plan: Last vitamin D Lab Results  Component Value Date   VD25OH 32.5 02/01/2023   Taking RX vitamin D 50,000 international units weekly with a goal 50-70.  Energy level improving.  Denies adverse SE.  Continue current RX vitamin D and repeat level in 3 mos  Orders: -     Vitamin D (Ergocalciferol); Take 1 capsule (50,000 Units total) by mouth every 7 (seven) days.  Dispense: 5 capsule; Refill: 0  Class 1 obesity with serious comorbidity and body mass index (BMI) of 32.0 to 32.9 in adult, unspecified obesity type  Other hyperlipidemia -     Lipid panel  Low serum vitamin B12 Assessment & Plan: Last B12 level low at 333.  She is on metformin and omeprazole which may contribute to B12 deficiency.  She has some complaints of fatigue but denies paresthesias or brain fog.  Begin vitamin B12 500 mcg once daily  Orders: -     Vitamin B-12; 1 tab sublingual daily  Dispense: 30 tablet; Refill: 6      She was informed of the importance of frequent follow up visits to maximize her success with intensive lifestyle modifications for her multiple health conditions.   ATTESTASTION STATEMENTS:  Reviewed by clinician on day of visit: allergies, medications, problem list, medical history, surgical history, family history, social history, and previous encounter notes pertinent to obesity diagnosis.   I have personally spent 30 minutes total time today in preparation, patient care, nutritional counseling and documentation for this visit, including the following: review of clinical lab tests; review of medical tests/procedures/services.      Annette Brink, DO DABFM, DABOM [facility] [address] [place], [place] [address] [phone]

## 2023-03-04 NOTE — Assessment & Plan Note (Signed)
Last B12 level low at 333.  She is on metformin and omeprazole which may contribute to B12 deficiency.  She has some complaints of fatigue but denies paresthesias or brain fog.  Begin vitamin B12 500 mcg once daily

## 2023-03-04 NOTE — Assessment & Plan Note (Signed)
She is not skipping meals.  She is focused on lean protein and fiber with mealsImproving on Lomaira 8 mg tab twice daily.  Blood pressure and heart rate are well-controlled.  She denies adverse side effects and does feel improved appetite control.  She is getting in lean protein and fiber with meals without over snacking.  Continue Lomaira 8 mg tab bid.  Reaching target goal of 2+ lb of weight loss per month Avoid pregnancy while on [place]

## 2023-03-04 NOTE — Assessment & Plan Note (Signed)
Last fasting insulin elevated at 23, up from 18.  Currently on metformin XR 750 mg daily.  Tolerating well without adverse SE.  Working on reducing intake of starches and sweets.  Added in 3 days / wk of exercise.

## 2023-03-04 NOTE — Assessment & Plan Note (Signed)
Last vitamin D Lab Results  Component Value Date   VD25OH 32.5 02/01/2023   Taking RX vitamin D 50,000 international units weekly with a goal 50-70.  Energy level improving.  Denies adverse SE.  Continue current RX vitamin D and repeat level in 3 mos

## 2023-03-05 LAB — LIPID PANEL
Chol/HDL Ratio: 3.4 ratio (ref 0.0–4.4)
Cholesterol, Total: 236 mg/dL — ABNORMAL HIGH (ref 100–199)
HDL: 69 mg/dL (ref >39)
LDL Chol Calc (NIH): 148 mg/dL — ABNORMAL HIGH (ref 0–99)
Triglycerides: 106 mg/dL (ref 0–149)
VLDL Cholesterol Cal: 19 mg/dL (ref 5–40)

## 2023-03-05 LAB — INSULIN, RANDOM: INSULIN: 33.7 u[IU]/mL — ABNORMAL HIGH (ref 2.6–24.9)

## 2023-03-25 ENCOUNTER — Telehealth (INDEPENDENT_AMBULATORY_CARE_PROVIDER_SITE_OTHER): Payer: Self-pay | Admitting: Family Medicine

## 2023-03-25 ENCOUNTER — Encounter (INDEPENDENT_AMBULATORY_CARE_PROVIDER_SITE_OTHER): Payer: Self-pay | Admitting: Family Medicine

## 2023-03-25 ENCOUNTER — Ambulatory Visit (INDEPENDENT_AMBULATORY_CARE_PROVIDER_SITE_OTHER): Payer: 59 | Admitting: Family Medicine

## 2023-03-25 VITALS — BP 114/80 | HR 91 | Temp 98.0°F | Ht 62.0 in | Wt 170.0 lb

## 2023-03-25 DIAGNOSIS — E88819 Insulin resistance, unspecified: Secondary | ICD-10-CM | POA: Diagnosis not present

## 2023-03-25 DIAGNOSIS — E559 Vitamin D deficiency, unspecified: Secondary | ICD-10-CM | POA: Diagnosis not present

## 2023-03-25 DIAGNOSIS — R632 Polyphagia: Secondary | ICD-10-CM

## 2023-03-25 DIAGNOSIS — E669 Obesity, unspecified: Secondary | ICD-10-CM

## 2023-03-25 DIAGNOSIS — Z6831 Body mass index (BMI) 31.0-31.9, adult: Secondary | ICD-10-CM

## 2023-03-25 MED ORDER — VITAMIN D (ERGOCALCIFEROL) 1.25 MG (50000 UNIT) PO CAPS
50000.0000 [IU] | ORAL_CAPSULE | ORAL | 0 refills | Status: DC
Start: 2023-03-25 — End: 2023-04-22

## 2023-03-25 MED ORDER — SEMAGLUTIDE(0.25 OR 0.5MG/DOS) 2 MG/3ML ~~LOC~~ SOPN
0.2500 mg | PEN_INJECTOR | SUBCUTANEOUS | 0 refills | Status: DC
Start: 2023-03-25 — End: 2024-06-09

## 2023-03-25 NOTE — Telephone Encounter (Signed)
Prior authorization done via cover my meds for patients Ozempic. Waiting on determination.  

## 2023-03-25 NOTE — Assessment & Plan Note (Signed)
Last vitamin D Lab Results  Component Value Date   VD25OH 32.5 02/01/2023   She has been taking vitamin D 50,000 IU once weekly.  Energy level is improving.  She denies adverse side effects.  Repeat vitamin D level in October

## 2023-03-25 NOTE — Assessment & Plan Note (Signed)
She has been taking metformin XR 750 mg once daily with food and has had problems with epigastric pain and morning regurgitation since starting.  She has been working on prescribed diet and increased walking time.    Will continue to work on a low sugar/ low starch diet with lean protein and fiber at meals, increasing walking time to 30 min 5 x a week AND will discontinue metformin due to adverse SE and change to Ozempic for IR / incretin effect.  Patient denies a personal or family history of pancreatitis, medullary thyroid carcinoma or multiple endocrine neoplasia type II. Recommend reviewing pen training video online.  Begin Ozempic 0.25 mg weekly

## 2023-03-25 NOTE — Progress Notes (Signed)
Office: [phone]  /  Fax: [phone]  WEIGHT SUMMARY AND BIOMETRICS  Starting Date: 04/27/22  Starting Weight: 175lb   Weight Lost Since Last Visit: 0lb   Vitals Temp: 98 F (36.7 C) BP: 114/80 Pulse Rate: 91 SpO2: 100 %   Body Composition  Body Fat %: 36.9 % Fat Mass (lbs): 62.8 lbs Muscle Mass (lbs): 101.8 lbs Total Body Water (lbs): 70.6 lbs Visceral Fat Rating : 7   HPI  Chief Complaint: OBESITY  Annette Ellison is here to discuss her progress with her obesity treatment plan. She is on the the Category 2 Plan and states she is following her eating plan approximately 95 % of the time. She states she is walking 30 minutes 3 times per week.   Interval History:  Since last office visit she is doing well on Cat 2 meal plan Denies meal skipping Hunger has increased despite taking Lomaira 8 mg bid She c/o epigastric pain, reflux and morning regurgitation on metformin  She is no longer having diarrhea Work stress is unchanged.   She has been walking outdoors 3 x a week She has a net weight loss of 5 lb in 10 mos of medically supervised weight management  Pharmacotherapy: Lomaira 8 mg bid; Metformin XR 750 mg daily  PHYSICAL EXAM:  Blood pressure 114/80, pulse 91, temperature 98 F (36.7 C), height 5\' 2"  (1.575 m), weight 170 lb (77.1 kg), SpO2 100%. Body mass index is 31.09 kg/m.  General: She is overweight, cooperative, alert, well developed, and in no acute distress. PSYCH: Has normal mood, affect and thought process.   Lungs: Normal breathing effort, no conversational dyspnea.   ASSESSMENT AND PLAN  TREATMENT PLAN FOR OBESITY:  Recommended Dietary Goals  Annette Ellison is currently in the action stage of change. As such, her goal is to continue weight management plan. She has agreed to the Category 2 Plan.  Behavioral Intervention  We discussed the following Behavioral Modification Strategies today: increasing lean protein intake, decreasing simple  carbohydrates , increasing vegetables, increasing lower glycemic fruits, increasing water intake, work on meal planning and preparation, keeping healthy foods at home, work on managing stress, creating time for self-care and relaxation measures, continue to practice mindfulness when eating, and planning for success.  Additional resources provided today: NA  Recommended Physical Activity Goals  Annette Ellison has been advised to work up to 150 minutes of moderate intensity aerobic activity a week and strengthening exercises 2-3 times per week for cardiovascular health, weight loss maintenance and preservation of muscle mass.   She has agreed to Start aerobic activity with a goal of 150 minutes a week at moderate intensity.   Pharmacotherapy changes for the treatment of obesity: discontinue Lomaira and Metformin Begin Ozempic 0.25 mg   ASSOCIATED CONDITIONS ADDRESSED TODAY  Polyphagia Assessment & Plan: Unchanged on Lomaira 8 mg bid.  BP and HR are WNL but she has failed to see adequate weight loss or feel the improved satiety with Lomaira over the past month.  She is getting in all of her meals on cat 2 meal plan without skipping.  Reports adequate lean protein and fiber intake with meals and snacks.    Will d/c Lomaira She may need to go back to logging on the My[employer]itness[employer] ap to ensure she is getting ~1200 cal-1400 cal/ day including 80 g of protein daily. Anticipate improved hunger on Ozempic    Insulin resistance Assessment & Plan: She has been taking metformin XR 750 mg once daily with food and  has had problems with epigastric pain and morning regurgitation since starting.  She has been working on prescribed diet and increased walking time.    Will continue to work on a low sugar/ low starch diet with lean protein and fiber at meals, increasing walking time to 30 min 5 x a week AND will discontinue metformin due to adverse SE and change to Ozempic for IR / incretin effect.  Patient denies a  personal or family history of pancreatitis, medullary thyroid carcinoma or multiple endocrine neoplasia type II. Recommend reviewing pen training video online.  Begin Ozempic 0.25 mg weekly  Orders: -     Semaglutide(0.25 or 0.5MG /DOS); Inject 0.25 mg into the skin once a week.  Dispense: 3 mL; Refill: 0  Vitamin D deficiency Assessment & Plan: Last vitamin D Lab Results  Component Value Date   VD25OH 32.5 02/01/2023   She has been taking vitamin D 50,000 IU once weekly.  Energy level is improving.  She denies adverse side effects.  Repeat vitamin D level in October  Orders: -     Vitamin D (Ergocalciferol); Take 1 capsule (50,000 Units total) by mouth every 7 (seven) days.  Dispense: 5 capsule; Refill: 0  Generalized obesity with starting BMI 32  BMI 31.0-31.9,adult      She was informed of the importance of frequent follow up visits to maximize her success with intensive lifestyle modifications for her multiple health conditions.   ATTESTASTION STATEMENTS:  Reviewed by clinician on day of visit: allergies, medications, problem list, medical history, surgical history, family history, social history, and previous encounter notes pertinent to obesity diagnosis.   I have personally spent 30 minutes total time today in preparation, patient care, nutritional counseling and documentation for this visit, including the following: review of clinical lab tests; review of medical tests/procedures/services.      Glennis Brink, DO DABFM, DABOM [facility] [address] [place], [place] [address] [phone]

## 2023-03-25 NOTE — Assessment & Plan Note (Signed)
Unchanged on Lomaira 8 mg bid.  BP and HR are WNL but she has failed to see adequate weight loss or feel the improved satiety with Lomaira over the past month.  She is getting in all of her meals on cat 2 meal plan without skipping.  Reports adequate lean protein and fiber intake with meals and snacks.    Will d/c Lomaira She may need to go back to logging on the [employer] ap to ensure she is getting ~1200 cal-1400 cal/ day including 80 g of protein daily. Anticipate improved hunger on Ozempic

## 2023-03-25 NOTE — Addendum Note (Signed)
Addended by: Glennis Brink on: 03/25/2023 09:14 AM   Modules accepted: Level of Service

## 2023-03-29 NOTE — Telephone Encounter (Signed)
Per Cover My Meds: Request Reference Number: [id number]. OZEMPIC INJ 2MG /3ML is denied for not meeting the prior authorization requirement(s)

## 2023-04-21 ENCOUNTER — Telehealth (INDEPENDENT_AMBULATORY_CARE_PROVIDER_SITE_OTHER): Payer: Self-pay | Admitting: Internal Medicine

## 2023-04-21 NOTE — Telephone Encounter (Signed)
Pt called stating that her PA for Ozempic has been denied. Pt stated that her insurance company states that they need a Peer-to-Peer with her doctor. Please contact pt today at the number on file.

## 2023-04-22 ENCOUNTER — Encounter (INDEPENDENT_AMBULATORY_CARE_PROVIDER_SITE_OTHER): Payer: Self-pay | Admitting: Internal Medicine

## 2023-04-22 ENCOUNTER — Ambulatory Visit (INDEPENDENT_AMBULATORY_CARE_PROVIDER_SITE_OTHER): Payer: 59 | Admitting: Internal Medicine

## 2023-04-22 VITALS — BP 115/81 | HR 86 | Temp 98.2°F | Ht 62.0 in | Wt 172.0 lb

## 2023-04-22 DIAGNOSIS — R638 Other symptoms and signs concerning food and fluid intake: Secondary | ICD-10-CM

## 2023-04-22 DIAGNOSIS — Z6832 Body mass index (BMI) 32.0-32.9, adult: Secondary | ICD-10-CM

## 2023-04-22 DIAGNOSIS — E88819 Insulin resistance, unspecified: Secondary | ICD-10-CM

## 2023-04-22 DIAGNOSIS — E669 Obesity, unspecified: Secondary | ICD-10-CM

## 2023-04-22 DIAGNOSIS — E559 Vitamin D deficiency, unspecified: Secondary | ICD-10-CM

## 2023-04-22 MED ORDER — VITAMIN D (ERGOCALCIFEROL) 1.25 MG (50000 UNIT) PO CAPS
50000.0000 [IU] | ORAL_CAPSULE | ORAL | 0 refills | Status: DC
Start: 2023-04-22 — End: 2024-06-09

## 2023-04-22 NOTE — Assessment & Plan Note (Signed)
She has increased orexigenic signaling, impaired satiety and inhibitory control. This is secondary to an abnormal energy regulation system and pathological neurohormonal pathways characteristic of excess adiposity.  In addition to nutritional and behavioral strategies she benefits from pharmacotherapy.   Therapies tried: Zonisamide, topiramate Lomaira, metformin

## 2023-04-22 NOTE — Progress Notes (Signed)
Office: [phone]  /  Fax: [phone]  WEIGHT SUMMARY AND BIOMETRICS  Vitals Temp: 98.2 F (36.8 C) BP: 115/81 Pulse Rate: 86 SpO2: 97 %   Anthropometric Measurements Height: 5\' 2"  (1.575 m) Weight: 172 lb (78 kg) BMI (Calculated): 31.45 Weight at Last Visit: 170 lb Weight Lost Since Last Visit: 0 lb Weight Gained Since Last Visit: 2 lb Starting Weight: 175 lb Total Weight Loss (lbs): 3 lb (1.361 kg)   Body Composition  Body Fat %: 37.2 % Fat Mass (lbs): 64 lbs Muscle Mass (lbs): 102.6 lbs Total Body Water (lbs): 69.6 lbs Visceral Fat Rating : 7    No data recorded Today's Visit #: 13  Starting Date: 04/27/22   HPI  Chief Complaint: OBESITY  Annette Ellison is here to discuss her progress with her obesity treatment plan. She is on the the Category 2 Plan and states she is following her eating plan approximately 80 % of the time. She states she is exercising 30 minutes 4 times per week.  Interval History:  First encounter with me, former patient of Dr. Cathey Endow. Peak weight 185. Gained weight during the pandemic in her 30's. Family history of overweight. Since last office visit she has gained weight. Started metformin a few months ago. Having problems with diarrhea, acid reflux.  Had been prescribed Ozempic but was denied by her insurance. She reports fair adherence to reduced calorie nutritional plan.   Orexigenic Control: Reports problems with appetite and hunger signals.  Reports problems with satiety and satiation.  Denies problems with eating patterns and portion control.  Denies abnormal cravings. Denies feeling deprived or restricted.   Barriers identified: presence of obesogenic drugs.   Pharmacotherapy for weight loss: She is currently taking Metformin (off label use for incretin effect and / or insulin resistance and / or diabetes prevention) with adequate clinical response  and experiencing the following side effects: see above.   ASSESSMENT AND  PLAN  TREATMENT PLAN FOR OBESITY:  Recommended Dietary Goals  Annette Ellison is currently in the action stage of change. As such, her goal is to continue weight management plan. She has agreed to: continue current plan  Behavioral Intervention  We discussed the following Behavioral Modification Strategies today: increasing lean protein intake, decreasing simple carbohydrates , increasing vegetables, increasing lower glycemic fruits, increasing fiber rich foods, increasing water intake, continue to practice mindfulness when eating, and planning for success.  Additional resources provided today:  Complete category 2 packet with supporting material  Recommended Physical Activity Goals  Annette Ellison has been advised to work up to 150 minutes of moderate intensity aerobic activity a week and strengthening exercises 2-3 times per week for cardiovascular health, weight loss maintenance and preservation of muscle mass.   She has agreed to :  Increase the intensity, frequency or duration of aerobic exercises   and continue yoga exercises 3 times a week  Pharmacotherapy We discussed various medication options to help [facility] with her weight loss efforts and we both agreed to :  Had been on topiramate for migraine prophylaxis in the past and zonisamide.  Did not tolerate metformin experience diarrhea stomach pains and acid reflux.  She had been on Lomaira as well without clinical response medication was discontinued after 3 months.  These medication trials precedes me  ASSOCIATED CONDITIONS ADDRESSED TODAY  Insulin resistance Assessment & Plan: Her HOMA-IR is 4.77 which is elevated. Optimal level < 1.9.   This is complex condition associated with genetics, ectopic fat and lifestyle factors. Insulin resistance  may also result in weight gain, abnormal cravings (particularly for carbs) and fatigue. This may result in additional weight gain and lead to pre-diabetes and diabetes if untreated. In addition, hyperinsulinemia  increases cardiovascular risk, chronic inflammatory response and may increase the risk of obesity related malignancies.  Lab Results  Component Value Date   HGBA1C 5.2 02/01/2023   Lab Results  Component Value Date   INSULIN 33.7 (H) 03/04/2023   INSULIN 23.0 02/01/2023   INSULIN 18.8 10/20/2022   INSULIN 25.7 (H) 04/27/2022   Lab Results  Component Value Date   GLUCOSE 84 02/01/2023   GLUCOSE 69 (L) 04/24/2010    We reviewed treatment options which include losing 7 to 10% of body weight, increasing physical activity to a 150 minutes a week at moderate intensity.  She benefits from reducing simple and processed carbs in diet.  She did not tolerate metformin for pharmacoprophylaxis.  I think she would benefit from incretin therapy we will try to see if insurance will make an exception.  Incretin therapy is cost prohibitive for patient.    Vitamin D deficiency Assessment & Plan: Most recent vitamin D levels  Lab Results  Component Value Date   VD25OH 32.5 02/01/2023   VD25OH 39.1 10/20/2022   VD25OH 14.6 (L) 04/27/2022     Deficiency state associated with adiposity and may result in leptin resistance, weight gain and fatigue. Currently on vitamin D supplementation without any adverse effects.  Plan: Continue high-dose vitamin D supplementation.  Consider transitioning to over-the-counter supplementation.  Patient new to me   Orders: -     Vitamin D (Ergocalciferol); Take 1 capsule (50,000 Units total) by mouth every 7 (seven) days.  Dispense: 5 capsule; Refill: 0  Abnormal food appetite Assessment & Plan: She has increased orexigenic signaling, impaired satiety and inhibitory control. This is secondary to an abnormal energy regulation system and pathological neurohormonal pathways characteristic of excess adiposity.  In addition to nutritional and behavioral strategies she benefits from pharmacotherapy.   Therapies tried: Zonisamide, topiramate Lomaira,  metformin   Class 1 obesity with serious comorbidity and body mass index (BMI) of 32.0 to 32.9 in adult, unspecified obesity type Assessment & Plan: See obesity treatment plan     PHYSICAL EXAM:  Blood pressure 115/81, pulse 86, temperature 98.2 F (36.8 C), height 5\' 2"  (1.575 m), weight 172 lb (78 kg), SpO2 97%. Body mass index is 31.46 kg/m.  General: She is overweight, cooperative, alert, well developed, and in no acute distress. PSYCH: Has normal mood, affect and thought process.   HEENT: EOMI, sclerae are anicteric. Lungs: Normal breathing effort, no conversational dyspnea. Extremities: No edema.  Neurologic: No gross sensory or motor deficits. No tremors or fasciculations noted.    DIAGNOSTIC DATA REVIEWED:  BMET    Component Value Date/Time   NA 140 02/01/2023 0839   K 4.2 02/01/2023 0839   CL 108 (H) 02/01/2023 0839   CO2 20 02/01/2023 0839   GLUCOSE 84 02/01/2023 0839   GLUCOSE 98 11/25/2021 0945   BUN 14 02/01/2023 0839   CREATININE 0.67 02/01/2023 0839   CALCIUM 9.3 02/01/2023 0839   GFRNONAA >60 11/25/2021 0945   GFRAA >60 02/18/2016 0406   Lab Results  Component Value Date   HGBA1C 5.2 02/01/2023   HGBA1C 5.4 04/27/2022   Lab Results  Component Value Date   INSULIN 33.7 (H) 03/04/2023   INSULIN 25.7 (H) 04/27/2022   Lab Results  Component Value Date   TSH 0.877 04/27/2022  CBC    Component Value Date/Time   WBC 8.2 04/27/2022 0834   WBC 12.5 (H) 11/25/2021 0945   RBC 4.97 04/27/2022 0834   RBC 5.21 (H) 11/25/2021 0945   HGB 14.8 04/27/2022 0834   HCT 45.6 04/27/2022 0834   PLT 272 04/27/2022 0834   MCV 92 04/27/2022 0834   MCH 29.8 04/27/2022 0834   MCH 30.5 11/25/2021 0945   MCHC 32.5 04/27/2022 0834   MCHC 33.9 11/25/2021 0945   RDW 12.4 04/27/2022 0834   Iron Studies No results found for: "IRON", "TIBC", "FERRITIN", "IRONPCTSAT" Lipid Panel     Component Value Date/Time   CHOL 236 (H) 03/04/2023 1300   TRIG 106 03/04/2023  1300   HDL 69 03/04/2023 1300   CHOLHDL 3.4 03/04/2023 1300   LDLCALC 148 (H) 03/04/2023 1300   Hepatic Function Panel     Component Value Date/Time   PROT 6.7 02/01/2023 0839   ALBUMIN 4.2 02/01/2023 0839   AST 10 02/01/2023 0839   ALT 12 02/01/2023 0839   ALKPHOS 108 02/01/2023 0839   BILITOT <0.2 02/01/2023 0839      Component Value Date/Time   TSH 0.877 04/27/2022 0834   Nutritional Lab Results  Component Value Date   VD25OH 32.5 02/01/2023   VD25OH 39.1 10/20/2022   VD25OH 14.6 (L) 04/27/2022     Return for Week of Sept 30th - 4 weeks - with Dr. Rikki Spearing - 40  minutes new patient to me. .. She was informed of the importance of frequent follow up visits to maximize her success with intensive lifestyle modifications for her multiple health conditions.   ATTESTASTION STATEMENTS:  Reviewed by clinician on day of visit: allergies, medications, problem list, medical history, surgical history, family history, social history, and previous encounter notes.     Worthy Rancher, MD

## 2023-04-22 NOTE — Assessment & Plan Note (Signed)
 See obesity treatment plan

## 2023-04-22 NOTE — Assessment & Plan Note (Signed)
Most recent vitamin D levels  Lab Results  Component Value Date   VD25OH 32.5 02/01/2023   VD25OH 39.1 10/20/2022   VD25OH 14.6 (L) 04/27/2022     Deficiency state associated with adiposity and may result in leptin resistance, weight gain and fatigue. Currently on vitamin D supplementation without any adverse effects.  Plan: Continue high-dose vitamin D supplementation.  Consider transitioning to over-the-counter supplementation.  Patient new to me

## 2023-04-22 NOTE — Assessment & Plan Note (Addendum)
Her HOMA-IR is 4.77 which is elevated. Optimal level < 1.9.   This is complex condition associated with genetics, ectopic fat and lifestyle factors. Insulin resistance may also result in weight gain, abnormal cravings (particularly for carbs) and fatigue. This may result in additional weight gain and lead to pre-diabetes and diabetes if untreated. In addition, hyperinsulinemia increases cardiovascular risk, chronic inflammatory response and may increase the risk of obesity related malignancies.  Lab Results  Component Value Date   HGBA1C 5.2 02/01/2023   Lab Results  Component Value Date   INSULIN 33.7 (H) 03/04/2023   INSULIN 23.0 02/01/2023   INSULIN 18.8 10/20/2022   INSULIN 25.7 (H) 04/27/2022   Lab Results  Component Value Date   GLUCOSE 84 02/01/2023   GLUCOSE 69 (L) 04/24/2010    We reviewed treatment options which include losing 7 to 10% of body weight, increasing physical activity to a 150 minutes a week at moderate intensity.  She benefits from reducing simple and processed carbs in diet.  She did not tolerate metformin for pharmacoprophylaxis.  I think she would benefit from incretin therapy we will try to see if insurance will make an exception.  Incretin therapy is cost prohibitive for patient.

## 2023-05-03 ENCOUNTER — Emergency Department (HOSPITAL_COMMUNITY)
Admission: EM | Admit: 2023-05-03 | Discharge: 2023-05-04 | Discharge status: Against Medical Advice | Payer: 59 | Attending: Emergency Medicine | Admitting: Emergency Medicine

## 2023-05-03 ENCOUNTER — Encounter (HOSPITAL_COMMUNITY): Payer: Self-pay

## 2023-05-03 DIAGNOSIS — R21 Rash and other nonspecific skin eruption: Secondary | ICD-10-CM | POA: Diagnosis present

## 2023-05-03 DIAGNOSIS — Z5321 Procedure and treatment not carried out due to patient leaving prior to being seen by health care provider: Secondary | ICD-10-CM | POA: Insufficient documentation

## 2023-05-03 DIAGNOSIS — L509 Urticaria, unspecified: Secondary | ICD-10-CM | POA: Insufficient documentation

## 2023-05-03 NOTE — ED Triage Notes (Signed)
Pt to ED with complaint of rash/hives to face and left hand that began yesterday. Denies exposure to known allergies. Denies difficulty breathing or swallowing.   Took benadryl 1 hour ago.

## 2023-05-03 NOTE — ED Notes (Signed)
Pt left without being seen.

## 2023-05-10 NOTE — Telephone Encounter (Signed)
Prior Auth for Reginal Lutes has been submitted.  Awaiting response.

## 2023-05-10 NOTE — Telephone Encounter (Signed)
Message from patient insurance plan:    Why was my request denied?  This request was denied because you did not meet the following requirements: The requested medication and/or diagnosis are not a covered benefit and excluded from coverage in accordance with the terms and conditions of your plan benefit. Therefore, the request has been administratively denied.

## 2023-05-27 ENCOUNTER — Ambulatory Visit (INDEPENDENT_AMBULATORY_CARE_PROVIDER_SITE_OTHER): Payer: 59 | Admitting: Internal Medicine

## 2023-06-17 ENCOUNTER — Telehealth (INDEPENDENT_AMBULATORY_CARE_PROVIDER_SITE_OTHER): Payer: Self-pay | Admitting: Internal Medicine

## 2023-06-17 NOTE — Telephone Encounter (Signed)
Attempted to reach the patient to discuss her recent message to cancel all appointments. Lmtrc.

## 2023-06-24 ENCOUNTER — Ambulatory Visit (INDEPENDENT_AMBULATORY_CARE_PROVIDER_SITE_OTHER): Payer: 59 | Admitting: Internal Medicine

## 2023-07-08 ENCOUNTER — Other Ambulatory Visit (HOSPITAL_COMMUNITY): Payer: Self-pay

## 2023-07-08 MED ORDER — EMGALITY 120 MG/ML ~~LOC~~ SOAJ
120.0000 mg | SUBCUTANEOUS | 3 refills | Status: DC
  Filled 2023-07-08: qty 1, 30d supply, fill #0

## 2023-07-08 MED ORDER — CHLORPROMAZINE HCL 25 MG PO TABS
25.0000 mg | ORAL_TABLET | ORAL | 0 refills | Status: DC | PRN
  Filled 2023-07-08: qty 20, 30d supply, fill #0

## 2023-07-08 MED ORDER — TOPIRAMATE 200 MG PO TABS
400.0000 mg | ORAL_TABLET | Freq: Every day | ORAL | 3 refills | Status: DC
  Filled 2023-07-08 – 2023-07-27 (×2): qty 60, 30d supply, fill #0

## 2023-07-08 MED ORDER — ELETRIPTAN HYDROBROMIDE 40 MG PO TABS
40.0000 mg | ORAL_TABLET | ORAL | 4 refills | Status: DC | PRN
  Filled 2023-07-08: qty 6, 3d supply, fill #0

## 2023-07-13 ENCOUNTER — Other Ambulatory Visit (HOSPITAL_COMMUNITY): Payer: Self-pay

## 2023-07-13 MED ORDER — GEMTESA 75 MG PO TABS
75.0000 mg | ORAL_TABLET | Freq: Every day | ORAL | 3 refills | Status: DC
  Filled 2023-07-13: qty 90, 90d supply, fill #0

## 2023-07-14 ENCOUNTER — Other Ambulatory Visit (HOSPITAL_COMMUNITY): Payer: Self-pay

## 2023-07-22 ENCOUNTER — Telehealth (INDEPENDENT_AMBULATORY_CARE_PROVIDER_SITE_OTHER): Payer: Self-pay | Admitting: Internal Medicine

## 2023-07-22 NOTE — Telephone Encounter (Signed)
I called patient once I learned about her dissatisfaction with her visit with me back in September. She had been seeing Dr. Cathey Endow and was schedule with me for focus visit but I didn't have time to review her records before entering the room. Her impression was that I was feeling "dumped" by Dr. Ovidio Kin patients and that I didn't have time to review their records but that was not what was expressed. I had scheduled a 40 minute follow-up after that visit to allow me to review her records and weight history and get to know her better but appointment was cancelled.   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

## 2023-07-26 ENCOUNTER — Other Ambulatory Visit (HOSPITAL_COMMUNITY): Payer: Self-pay

## 2023-07-27 ENCOUNTER — Other Ambulatory Visit: Payer: Self-pay

## 2023-07-27 ENCOUNTER — Other Ambulatory Visit (HOSPITAL_COMMUNITY): Payer: Self-pay

## 2023-10-29 ENCOUNTER — Other Ambulatory Visit (HOSPITAL_COMMUNITY): Payer: Self-pay

## 2023-10-29 ENCOUNTER — Other Ambulatory Visit: Payer: Self-pay

## 2023-10-29 MED ORDER — TOPIRAMATE 200 MG PO TABS
400.0000 mg | ORAL_TABLET | Freq: Every day | ORAL | 2 refills | Status: DC
  Filled 2023-10-29 – 2023-11-25 (×3): qty 60, 30d supply, fill #0

## 2023-10-29 MED ORDER — ELETRIPTAN HYDROBROMIDE 40 MG PO TABS
40.0000 mg | ORAL_TABLET | ORAL | 2 refills | Status: DC | PRN
  Filled 2023-10-29 – 2023-11-25 (×2): qty 6, 3d supply, fill #0

## 2023-10-29 MED ORDER — EMGALITY 120 MG/ML ~~LOC~~ SOAJ
120.0000 mg | SUBCUTANEOUS | 2 refills | Status: DC
  Filled 2023-10-29 – 2023-11-25 (×2): qty 1, 30d supply, fill #0

## 2023-10-29 MED ORDER — CHLORPROMAZINE HCL 25 MG PO TABS
25.0000 mg | ORAL_TABLET | ORAL | 1 refills | Status: DC | PRN
  Filled 2023-10-29 – 2023-11-25 (×2): qty 20, 30d supply, fill #0

## 2023-11-10 ENCOUNTER — Other Ambulatory Visit (HOSPITAL_COMMUNITY): Payer: Self-pay

## 2023-11-15 ENCOUNTER — Other Ambulatory Visit (HOSPITAL_COMMUNITY): Payer: Self-pay

## 2023-11-25 ENCOUNTER — Other Ambulatory Visit: Payer: Self-pay

## 2023-11-25 ENCOUNTER — Other Ambulatory Visit (HOSPITAL_COMMUNITY): Payer: Self-pay

## 2023-11-26 ENCOUNTER — Other Ambulatory Visit (HOSPITAL_COMMUNITY): Payer: Self-pay

## 2024-04-07 LAB — LAB REPORT - SCANNED: A1c: 5.4

## 2024-04-12 ENCOUNTER — Other Ambulatory Visit (HOSPITAL_COMMUNITY): Payer: Self-pay

## 2024-04-12 MED ORDER — TOPIRAMATE 200 MG PO TABS
400.0000 mg | ORAL_TABLET | Freq: Every day | ORAL | 2 refills | Status: AC
  Filled 2024-04-12 – 2024-06-07 (×2): qty 60, 30d supply, fill #0

## 2024-04-12 MED ORDER — EMGALITY 120 MG/ML ~~LOC~~ SOAJ
120.0000 mg | SUBCUTANEOUS | 2 refills | Status: AC
  Filled 2024-04-12 – 2024-06-07 (×2): qty 1, 30d supply, fill #0

## 2024-04-12 MED ORDER — CHLORPROMAZINE HCL 25 MG PO TABS
25.0000 mg | ORAL_TABLET | ORAL | 1 refills | Status: AC | PRN
  Filled 2024-04-12 – 2024-06-07 (×2): qty 20, 30d supply, fill #0

## 2024-04-12 MED ORDER — ELETRIPTAN HYDROBROMIDE 40 MG PO TABS
40.0000 mg | ORAL_TABLET | ORAL | 2 refills | Status: AC | PRN
  Filled 2024-04-12 – 2024-06-07 (×2): qty 6, 3d supply, fill #0

## 2024-04-19 ENCOUNTER — Encounter: Payer: Self-pay | Admitting: Gastroenterology

## 2024-04-25 ENCOUNTER — Other Ambulatory Visit (HOSPITAL_COMMUNITY): Payer: Self-pay

## 2024-06-08 ENCOUNTER — Other Ambulatory Visit (HOSPITAL_COMMUNITY): Payer: Self-pay

## 2024-06-09 ENCOUNTER — Other Ambulatory Visit

## 2024-06-09 ENCOUNTER — Ambulatory Visit: Admitting: Gastroenterology

## 2024-06-09 ENCOUNTER — Encounter: Payer: Self-pay | Admitting: Gastroenterology

## 2024-06-09 VITALS — BP 122/78 | HR 87 | Ht 62.0 in | Wt 188.2 lb

## 2024-06-09 DIAGNOSIS — K219 Gastro-esophageal reflux disease without esophagitis: Secondary | ICD-10-CM

## 2024-06-09 DIAGNOSIS — R198 Other specified symptoms and signs involving the digestive system and abdomen: Secondary | ICD-10-CM | POA: Diagnosis not present

## 2024-06-09 DIAGNOSIS — R112 Nausea with vomiting, unspecified: Secondary | ICD-10-CM | POA: Diagnosis not present

## 2024-06-09 DIAGNOSIS — R1030 Lower abdominal pain, unspecified: Secondary | ICD-10-CM

## 2024-06-09 DIAGNOSIS — K5900 Constipation, unspecified: Secondary | ICD-10-CM | POA: Diagnosis not present

## 2024-06-09 DIAGNOSIS — Z800 Family history of malignant neoplasm of digestive organs: Secondary | ICD-10-CM | POA: Diagnosis not present

## 2024-06-09 LAB — CBC WITH DIFFERENTIAL/PLATELET
Basophils Absolute: 0 K/uL (ref 0.0–0.1)
Basophils Relative: 0.7 % (ref 0.0–3.0)
Eosinophils Absolute: 0 K/uL (ref 0.0–0.7)
Eosinophils Relative: 0.5 % (ref 0.0–5.0)
HCT: 43.4 % (ref 36.0–46.0)
Hemoglobin: 14.3 g/dL (ref 12.0–15.0)
Lymphocytes Relative: 18.7 % (ref 12.0–46.0)
Lymphs Abs: 1.3 K/uL (ref 0.7–4.0)
MCHC: 33.1 g/dL (ref 30.0–36.0)
MCV: 89.8 fl (ref 78.0–100.0)
Monocytes Absolute: 0.4 K/uL (ref 0.1–1.0)
Monocytes Relative: 5.9 % (ref 3.0–12.0)
Neutro Abs: 5.1 K/uL (ref 1.4–7.7)
Neutrophils Relative %: 74.2 % (ref 43.0–77.0)
Platelets: 273 K/uL (ref 150.0–400.0)
RBC: 4.83 Mil/uL (ref 3.87–5.11)
RDW: 13.3 % (ref 11.5–15.5)
WBC: 6.8 K/uL (ref 4.0–10.5)

## 2024-06-09 LAB — COMPREHENSIVE METABOLIC PANEL WITH GFR
ALT: 17 U/L (ref 0–35)
AST: 11 U/L (ref 0–37)
Albumin: 4.4 g/dL (ref 3.5–5.2)
Alkaline Phosphatase: 73 U/L (ref 39–117)
BUN: 13 mg/dL (ref 6–23)
CO2: 24 meq/L (ref 19–32)
Calcium: 9.5 mg/dL (ref 8.4–10.5)
Chloride: 104 meq/L (ref 96–112)
Creatinine, Ser: 0.64 mg/dL (ref 0.40–1.20)
GFR: 112.32 mL/min (ref >60.00)
Glucose, Bld: 94 mg/dL (ref 70–99)
Potassium: 4 meq/L (ref 3.5–5.1)
Sodium: 139 meq/L (ref 135–145)
Total Bilirubin: 0.4 mg/dL (ref 0.2–1.2)
Total Protein: 7.5 g/dL (ref 6.0–8.3)

## 2024-06-09 LAB — TSH: TSH: 0.69 u[IU]/mL (ref 0.35–5.50)

## 2024-06-09 MED ORDER — FAMOTIDINE 20 MG PO TABS: 20.0000 mg | ORAL_TABLET | Freq: Every day | ORAL | 3 refills | Status: AC

## 2024-06-09 MED ORDER — NA SULFATE-K SULFATE-MG SULF 17.5-3.13-1.6 GM/177ML PO SOLN: 1.0000 | Freq: Once | ORAL | 0 refills | Status: AC

## 2024-06-09 MED ORDER — LINACLOTIDE 145 MCG PO CAPS: 145.0000 ug | ORAL_CAPSULE | Freq: Every day | ORAL | 0 refills | Status: DC

## 2024-06-09 MED ORDER — PANTOPRAZOLE SODIUM 40 MG PO TBEC: 40.0000 mg | DELAYED_RELEASE_TABLET | Freq: Every day | ORAL | 3 refills | Status: DC

## 2024-06-09 NOTE — Patient Instructions (Signed)
 Your provider has requested that you go to the basement level for lab work before leaving today. Press B on the elevator. The lab is located at the first door on the left as you exit the elevator.   Your provider has ordered Di[employer] stool testing for you. You have received a kit from our office today containing all necessary supplies to complete this test. Please carefully read the stool collection instructions provided in the kit before opening the accompanying materials. In addition, be sure there is a label providing your full name and date of birth on the puritan opti-swab tube that is supplied in the kit (if you do not see a label with this information on your test tube, please make us  aware before test collection!). After completing the test, you should secure the purtian tube into the specimen biohazard bag. The [facility] E-Req sheet (including date and time of specimen collection) should be placed into the outside pocket of the specimen biohazard bag and returned to the [place] lab (basement floor of [facility]) within 3 days of collection. Please make sure to give the specimen to a staff member at the lab. DO NOT leave the specimen on the counter.   If the specimen date and time (can be found in the upper right boxed portion of the sheet) are not filled out on the E-Req sheet, the test will NOT be performed.   We have sent the following medications to your pharmacy for you to pick up at your convenience: pantoprazole 40 mg daily and famotidine 20 mg at bedtime.   We have given you samples of the following medication to take: Linzess 145 mcg  You have been scheduled for a colonoscopy. Please follow written instructions given to you at your visit today.   If you use inhalers (even only as needed), please bring them with you on the day of your procedure.  DO NOT TAKE 7 DAYS PRIOR TO TEST- Trulicity (dulaglutide) Ozempic , Wegovy  (semaglutide ) Mounjaro  (tirzepatide) Bydureon Bcise (exanatide extended release)  DO NOT TAKE 1 DAY PRIOR TO YOUR TEST Rybelsus  (semaglutide ) Adlyxin (lixisenatide) Victoza (liraglutide) Byetta (exanatide) ___________________________________________________________________________

## 2024-06-09 NOTE — Progress Notes (Addendum)
 Kelty Szafran [id number] 01-Sep-1985   Chief Complaint: Abdominal pain, constipation  Referring Provider: Cristopher Suzen HERO, NP Primary GI MD: Sampson  HPI: Janine Reller Eugenio is a 38 y.o. female with past medical history of anxiety/depression, asthma, elevated cholesterol, GERD, IBS with constipation, lactose intolerance, hysterectomy who presents today for a complaint of constipation and abdominal pain.    Previously seen at [facility] clinic GI 2022 to establish care.  History of IBS and GERD. Colonoscopy 08/08/2018 showed a normal examined colon with random biopsies that showed focal active cryptitis likely from bowel prep, normal examined terminal ileum with negative biopsies, nonbleeding internal hemorrhoids  She met the Rome IV criteria for IBS with recurrent abdominal pain at least once a week for more than 3 months.  Pain related to defecation and associated with change in stool frequency and formed.  She was advised to try IBgard, Levsin.  Also had a history of GERD and advised to start omeprazole 20 mg daily at that time.   Discussed the use of AI scribe software for clinical note transcription with the patient, who gave verbal consent to proceed.  History of Present Illness Shae Hinnenkamp is a 38 year old female who presents with worsening constipation, abdominal pain, and nausea with vomiting. She was referred by a doctor from urgent care for evaluation of her gastrointestinal symptoms.  She has a long-standing history of constipation and abdominal pain, with recent worsening of symptoms. The abdominal pain is primarily located in the lower abdomen and sometimes improves after a bowel movement. She uses Miralax as needed, which provides occasional relief, and also uses Benefiber once daily. She experiences alternating episodes of diarrhea, which she describes as occurring three times in the past week, and does not associate with dietary changes. No blood in stool or  black stools.  She experiences significant nausea and vomiting, primarily in the morning, which has been ongoing for three to four months. The vomiting is described as 'nothing but acid' and occurs for about two hours after waking, resolving by the time she arrives at work. She has a history of acid reflux and has previously tried omeprazole without significant relief. No blood in vomit and no heartburn after eating, but frequent burping, bloating, and gas are reported.  Her family history is significant for colon cancer, with her father having died from it at age 3. She has undergone three colonoscopies, the last being in 2019, and an upper endoscopy prior to that, with no significant findings reported. She was tested for H. pylori in the past, which was negative.  Her social history includes a sedentary lifestyle due to her work at a [employer] and attending school in the evenings, limiting her physical activity. She reports staying hydrated and consuming a normal diet, avoiding milk products due to gas and bloating.    Previous GI Procedures/Imaging   Colonoscopy 08/08/2018  - normal examined colon with random biopsies showed focal active cryptitis likely from bowel preparation, normal examined terminal ileum with negative biopsies , non-bleeding internal hemorrhoids    Past Medical History:  Diagnosis Date   Allergic rhinitis    Anxiety    Asthma    Back pain    Depression    Elevated blood pressure reading with diagnosis of hypertension 06/23/2022   Elevated cholesterol    GERD (gastroesophageal reflux disease)    IBS (irritable bowel syndrome)    Constipation   Joint pain    Lactose intolerance    Wears  contact lenses     Past Surgical History:  Procedure Laterality Date   ABDOMINAL HYSTERECTOMY  2012   W/  LEFT SALPINGOOPHORECTOMY   CESAREAN SECTION  08-12-2010   CYSTO WITH HYDRODISTENSION N/A 03/15/2014   Procedure: CYSTO/HYDRODISTENSION OF BLADDER/INSTALLATION of  marcaine  and pyridium ;  Surgeon: Glendia DELENA Elizabeth, MD;  Location: [facility] ;  Service: Urology;  Laterality: N/A;   LAPAROSCOPY W/ OVARIAN CYSTECTOMY  2010    Current Outpatient Medications  Medication Sig Dispense Refill   albuterol (VENTOLIN HFA) 108 (90 Base) MCG/ACT inhaler INHALE 2 PUFFS BY MOUTH FOUR TIMES DAILY FOR WHEEZING     cetirizine (ZYRTEC) 10 MG tablet Take 1 tablet by mouth daily.     chlorproMAZINE  (THORAZINE ) 25 MG tablet Take 1-2 tablets (25-50 mg total) by mouth as needed for headache rescue. May repeat after 2-3 hours.  Limit 4 tablets/24 hours. *Caution: Highly sedating* 20 tablet 1   Cyanocobalamin (VITAMIN B-12) 500 MCG SUBL 1 tab sublingual daily 30 tablet 6   dicyclomine (BENTYL) 10 MG capsule Take 10 mg by mouth 4 (four) times daily as needed.     DULoxetine (CYMBALTA) 30 MG capsule Take 30 mg by mouth 2 (two) times daily.     eletriptan  (RELPAX ) 40 MG tablet Take 1 tablet (40 mg total) by mouth as needed for migraine. May repeat once after 2 hours. 6 tablet 2   EMGALITY  120 MG/ML SOAJ Inject 120 mg into the skin every 30 (thirty) days for migraine prevention. 1 mL 2   EPINEPHrine 0.3 mg/0.3 mL IJ SOAJ injection USE AS DIRECTED AS NEEDED FOR SYSTEMIC REACTIONS     estradiol (ESTRACE) 0.1 MG/GM vaginal cream Place vaginally.     montelukast  (SINGULAIR ) 10 MG tablet Take 10 mg by mouth daily.     MYRBETRIQ 50 MG TB24 tablet Take 1 tablet every day by oral route.     rizatriptan (MAXALT) 10 MG tablet Oral; Duration: 15     SLYND 4 MG TABS Take 1 tablet by mouth daily.     sulfamethoxazole-trimethoprim (BACTRIM DS) 800-160 MG tablet Take 1 tablet by mouth 2 (two) times daily.     SYMBICORT 80-4.5 MCG/ACT inhaler 2 puffs Inhalation prn up to 12 puffs a day for 30 days     tiZANidine  (ZANAFLEX ) 4 MG tablet Take 4 mg by mouth 2 (two) times daily as needed.     topiramate  (TOPAMAX ) 200 MG tablet Take 2 tablets (400 mg total) by mouth daily. 60 tablet 2    Vitamin D , Ergocalciferol , (DRISDOL ) 1.25 MG (50000 UNIT) CAPS capsule Take 1 capsule (50,000 Units total) by mouth every 7 (seven) days. 5 capsule 0   zonisamide (ZONEGRAN) 50 MG capsule Take 50 mg by mouth in the morning, at noon, in the evening, and at bedtime.     No current facility-administered medications for this visit.    Allergies as of 06/09/2024 - Review Complete 06/09/2024  Allergen Reaction Noted   Hydrocodone -acetaminophen  Dermatitis and Rash 06/21/2014   Penicillins Shortness Of Breath and Rash 11/01/2013   Pineapple Shortness Of Breath and Rash 03/09/2014   Other Rash 03/09/2014   Vicodin [hydrocodone -acetaminophen ] Rash 11/01/2013    Family History  Problem Relation Age of Onset   Migraines Mother    High Cholesterol Mother    High blood pressure Mother    Depression Mother    Obesity Mother    Cancer Father    Breast cancer Maternal Aunt    Mental illness Other  Stroke Other    Diabetes Other     Social History   Tobacco Use   Smoking status: Never   Smokeless tobacco: Never  Vaping Use   Vaping status: Never Used  Substance Use Topics   Alcohol use: Yes    Alcohol/week: 1.0 standard drink of alcohol    Types: 1 Glasses of wine per week    Comment: OCC   Drug use: No     Review of Systems:    Constitutional: No weight loss, fever, chills Cardiovascular: No chest pain Respiratory: No SOB  Gastrointestinal: See HPI and otherwise negative   Physical Exam:  Vital signs: BP 122/78   Pulse 87   Ht 5' 2 (1.575 m)   Wt 188 lb 4 oz (85.4 kg)   BMI 34.43 kg/m   Constitutional: Pleasant, overweight female in NAD, alert and cooperative Head:  Normocephalic and atraumatic.  Eyes: No scleral icterus.  Respiratory: Respirations even and unlabored. Lungs clear to auscultation bilaterally.  No wheezes, crackles, or rhonchi.  Cardiovascular:  Regular rate and rhythm. No murmurs. No peripheral edema. Gastrointestinal:  Soft, nondistended, nontender.  No rebound or guarding. Normal bowel sounds. No appreciable masses or hepatomegaly. Rectal:  Not performed.  Neurologic:  Alert and oriented x4;  grossly normal neurologically.  Skin:   Dry and intact without significant lesions or rashes. Psychiatric: Oriented to person, place and time. Demonstrates good judgement and reason without abnormal affect or behaviors.   RELEVANT LABS AND IMAGING: CBC    Component Value Date/Time   WBC 8.2 04/27/2022 0834   WBC 12.5 (H) 11/25/2021 0945   RBC 4.97 04/27/2022 0834   RBC 5.21 (H) 11/25/2021 0945   HGB 14.8 04/27/2022 0834   HCT 45.6 04/27/2022 0834   PLT 272 04/27/2022 0834   MCV 92 04/27/2022 0834   MCH 29.8 04/27/2022 0834   MCH 30.5 11/25/2021 0945   MCHC 32.5 04/27/2022 0834   MCHC 33.9 11/25/2021 0945   RDW 12.4 04/27/2022 0834   LYMPHSABS 1.5 04/27/2022 0834   MONOABS 0.8 11/25/2021 0945   EOSABS 0.1 04/27/2022 0834   BASOSABS 0.1 04/27/2022 0834    CMP     Component Value Date/Time   NA 140 02/01/2023 0839   K 4.2 02/01/2023 0839   CL 108 (H) 02/01/2023 0839   CO2 20 02/01/2023 0839   GLUCOSE 84 02/01/2023 0839   GLUCOSE 98 11/25/2021 0945   BUN 14 02/01/2023 0839   CREATININE 0.67 02/01/2023 0839   CALCIUM 9.3 02/01/2023 0839   PROT 6.7 02/01/2023 0839   ALBUMIN 4.2 02/01/2023 0839   AST 10 02/01/2023 0839   ALT 12 02/01/2023 0839   ALKPHOS 108 02/01/2023 0839   BILITOT <0.2 02/01/2023 0839   GFRNONAA >60 11/25/2021 0945   GFRAA >60 02/18/2016 0406     Assessment/Plan:   Family history of colon cancer -father Patient has had 3 prior colonoscopies, most recently in 2019.  Due for repeat colonoscopy based on family history of colon cancer in her father who died of the disease at age 35.  - Schedule colonoscopy. I thoroughly discussed the procedure with the patient to include nature of the procedure, alternatives, benefits, and risks (including but not limited to bleeding, infection, perforation,  anesthesia/cardiac/pulmonary complications). Patient verbalized understanding and gave verbal consent to proceed with procedure.  - Request past colonoscopy records  Constipation Lower abdominal pain Alternating constipation diarrhea Patient with longstanding history of IBS which is constipation predominant.  Evaluated by other GI  groups in the past.  Has a sedentary lifestyle with limited physical activity.  No blood in stool or melena.  Has intermittent lower abdominal pain which usually improves after bowel movement.  Uses MiraLAX as needed, takes Benefiber daily.  Occasionally has some alternating diarrhea.  - Will give samples of Linzess 145 mcg and can send prescription if helpful - Continue daily fiber supplementation - Recommend increased physical activity - Consider Ibgard vs antispasmodic  GERD Nausea and vomiting Patient with history of GERD, not currently on any antireflux medications.  States that she has been having reflux primarily in the mornings, and in the last 3 to 4 months has had intermittent nausea and vomiting.  States she vomits nothing but acid, and symptoms resolve a couple hours after waking.  Tried omeprazole in the past but denies improvement with this.  Looks like she was started on 20 mg daily back in 2022.  Does have history of EGD in the past and states that there were no significant findings.  She believes she has previously tested negative for H. pylori.  - Request past EGD records - Start Protonix 40 mg daily - H. pylori stool - Labs today: CBC, CMP, TSH, TTG, IgA - If symptoms persist consider EGD   Camie Furbish, PA-C [facility] 06/09/2024, 8:51 AM  Patient Care Team: Cristopher Suzen HERO, NP as PCP - General     Addendum 06/14/2024:  Past records received:   Abdominal ultrasound 03/2013:  Largely unremarkable, with incidental finding of mildly enlarged ovary in the midline/left lower quadrant.  Also noted to have slightly echogenic  echotexture of the liver.  Colonoscopy 04/10/2013 (diagnostic for abdominal pain) - Entire colon normal, biopsied - Examined portion of the ileum was normal Path: Colonic mucosa with no diagnostic abnormalities  EGD 04/10/2013 (for heartburn and nausea) - Normal esophagus - Normal stomach, biopsied - Normal examined duodenum Path: Mild chronic gastritis Negative for H. pylori and dysplasia  Per office visit note 06/2013 with [facility] clinic GI, patient was suspected to have IBS-C and was started on Linzess under 45 mcg.  Seen again at [facility] clinic in 2018 and endorsed having not had any resolution of symptoms since 2014.  Endorsed diarrhea 4-5 times a day at that time, GERD symptoms with nausea in the morning.  Colonoscopy 08/08/2018 (for change in bowel habits) - The entire examined colon is normal - The examined portion of the ileum was normal, biopsied - Non-bleeding internal hemorrhoids - The examination was otherwise normal - Random biopsies were obtained Path: Focal active cryptitis, negative for dysplasia or malignancy. Findings thought likely from bowel preparation and she was advised to have a repeat colonoscopy at age 59   Based on these records will recommend we cancel colonoscopy discuss further at follow up.

## 2024-06-10 LAB — TISSUE TRANSGLUTAMINASE, IGA: (tTG) Ab, IgA: <1 U/mL

## 2024-06-10 LAB — IGA: Immunoglobulin A: 134 mg/dL (ref 47–310)

## 2024-06-12 ENCOUNTER — Ambulatory Visit: Payer: Self-pay | Admitting: Gastroenterology

## 2024-06-14 ENCOUNTER — Telehealth: Payer: Self-pay | Admitting: Gastroenterology

## 2024-06-14 NOTE — Telephone Encounter (Signed)
 Patient's past records received and reviewed.  She has history of IBS.  Her previous colonoscopies have been done for diagnostic purposes.  Her last colonoscopy in 2019 was normal with recall recommended at age 38.  Guidelines since 2019 have changed and we now will start standard screening at age 49, though she may be due for repeat at age 16 given her family history.  I will need to discuss this further with Dr. Legrand to get his input on most appropriate screening date.  I recommend we cancel upcoming colonoscopy, which we had ordered for screening purposes due to family history, and have her follow up in office to discuss timing of her next colonoscopy and to follow up on her symptoms.  If she has new or worsening symptoms we could consider a diagnostic colonoscopy.

## 2024-06-15 MED ORDER — LINACLOTIDE 145 MCG PO CAPS: 145.0000 ug | ORAL_CAPSULE | Freq: Every day | ORAL | 1 refills | Status: DC

## 2024-06-15 NOTE — Telephone Encounter (Signed)
 Pt returned call. Discussed provider recommendations. Colonoscopy cancelled. Pt scheduled for f/u ov for 08/02/2024 at 1530. Pt reports that the Linzess samples are working well for her when she takes them. She would like an Rx sent to local pharmacy. Will send Rx based on last OV note for Linzess 145 mcg.

## 2024-06-15 NOTE — Telephone Encounter (Signed)
 Attempted to reach patient. No answer, left VM for patient to return call.

## 2024-06-18 NOTE — Progress Notes (Signed)
 ____________________________________________________________  Attending physician addendum:  Thank you for sending this case to me. I have reviewed the entire note and agree with the plan.  No risk factors for gastroparesis, but the degree and frequency of vomiting in the setting of what sounds like a motility disorder causing constipation warrants a gastric emptying study.  Based on those findings and eventual EGD/colonoscopy findings, Motegrity may be a good medication choice for her if can get insurance authorization and is not cost prohibitive.  Victory Brand, MD  ____________________________________________________________

## 2024-06-19 ENCOUNTER — Telehealth: Payer: Self-pay | Admitting: Gastroenterology

## 2024-06-19 DIAGNOSIS — R1030 Lower abdominal pain, unspecified: Secondary | ICD-10-CM

## 2024-06-19 DIAGNOSIS — R112 Nausea with vomiting, unspecified: Secondary | ICD-10-CM

## 2024-06-19 NOTE — Telephone Encounter (Signed)
 Spoke with pt regarding recommendations of gastric emptying study. Pt in agreement to schedule. Orders placed. Message sent to schedulers.

## 2024-06-19 NOTE — Telephone Encounter (Signed)
 Dr. Legrand has suggested we order a gastric emptying study to further evaluate patient's symptoms and rule out gastroparesis.  If patient is agreeable, please order GES, thank you.

## 2024-06-23 ENCOUNTER — Encounter: Admitting: Gastroenterology

## 2024-06-27 ENCOUNTER — Ambulatory Visit (HOSPITAL_COMMUNITY)
Admission: RE | Admit: 2024-06-27 | Discharge: 2024-06-27 | Disposition: A | Discharge status: Home / Self Care | Source: Ambulatory Visit | Attending: Gastroenterology | Admitting: Gastroenterology

## 2024-06-27 DIAGNOSIS — R112 Nausea with vomiting, unspecified: Secondary | ICD-10-CM | POA: Insufficient documentation

## 2024-06-27 DIAGNOSIS — R1030 Lower abdominal pain, unspecified: Secondary | ICD-10-CM | POA: Diagnosis present

## 2024-06-27 MED ORDER — TECHNETIUM TC 99M SULFUR COLLOID
2.0000 | Freq: Once | INTRAVENOUS | Status: AC
  Administered 2024-06-27: 2.2 via ORAL

## 2024-06-28 ENCOUNTER — Ambulatory Visit: Payer: Self-pay | Admitting: Gastroenterology

## 2024-07-04 ENCOUNTER — Telehealth: Payer: Self-pay | Admitting: Gastroenterology

## 2024-07-04 NOTE — Telephone Encounter (Signed)
 [facility] H. pylori results received.  Negative for H. pylori 06/30/2024.

## 2024-07-04 NOTE — Telephone Encounter (Signed)
 Inbound call from patient stating that she is trying to inquire about her stool test that she turned into the lab in our basement. Patient was wanting to speak to someone in the lab in regards to what was taking so long, so patient was transfer over to the lab. Patient call back stating she was rudely told that they could not advise her on anything and they where going to hang up on her. Patient called our office back and would like us  to find out what is going on with her stool results. Please advise.

## 2024-07-04 NOTE — Telephone Encounter (Signed)
 Yes, I was wondering how you get them.

## 2024-07-04 NOTE — Telephone Encounter (Signed)
 How do you get the results?

## 2024-07-04 NOTE — Telephone Encounter (Signed)
 Attempted to reach pt by phone.  Voice mail full will send a letter to her My Chart. She does view messages

## 2024-07-04 NOTE — Telephone Encounter (Signed)
 Camie have you reviewed the [employer] results? The web portal states negative but I am not sure if you have reviewed.    Left message on machine to call back

## 2024-07-06 ENCOUNTER — Ambulatory Visit: Admitting: Family Medicine

## 2024-07-06 ENCOUNTER — Encounter: Payer: Self-pay | Admitting: Family Medicine

## 2024-07-06 VITALS — BP 124/86 | HR 87 | Ht 62.0 in | Wt 186.8 lb

## 2024-07-06 DIAGNOSIS — Z23 Encounter for immunization: Secondary | ICD-10-CM

## 2024-07-06 DIAGNOSIS — L659 Nonscarring hair loss, unspecified: Secondary | ICD-10-CM

## 2024-07-06 DIAGNOSIS — G2589 Other specified extrapyramidal and movement disorders: Secondary | ICD-10-CM | POA: Diagnosis not present

## 2024-07-06 DIAGNOSIS — Z0000 Encounter for general adult medical examination without abnormal findings: Secondary | ICD-10-CM | POA: Diagnosis not present

## 2024-07-06 MED ORDER — MELOXICAM 15 MG PO TABS: 15.0000 mg | ORAL_TABLET | Freq: Every day | ORAL | 0 refills | Status: AC

## 2024-07-06 MED ORDER — MINOXIDIL 2.5 MG PO TABS: 5.0000 mg | ORAL_TABLET | Freq: Every day | ORAL | 1 refills | Status: AC

## 2024-07-06 NOTE — Progress Notes (Signed)
 Name: [name]   Date of Visit: 07/06/24   Date of last visit with me: Visit date not found   CHIEF COMPLAINT:  Chief Complaint  Patient presents with   Annual Exam    New patient. Wants vitamin d  levels checked, and talk about hair loss.        HPI:  Discussed the use of AI scribe software for clinical note transcription with the patient, who gave verbal consent to proceed.  History of Present Illness   Annette Ellison is a 38 year old female who presents with hair loss and shoulder pain.  She has been experiencing significant hair loss for more than six months, particularly noticing it during showering. She has a history of hot flashes and underwent a hysterectomy in 2012, resulting in the absence of menstrual cycles. She uses estradiol cream for vaginal dryness and bladder issues. Her thyroid  function was checked three weeks ago and was normal. She has not had an A1c test recently, but her last test a year ago was normal. She reports poor sleep, unintentional weight gain, and snoring, and has been diagnosed with mild sleep apnea.  She experiences chronic shoulder pain, specifically in the shoulder blade area, described as constant and worsened by certain positions, such as lying on her back or side. She has been receiving trigger point injections and tizanidine  for this issue, prescribed by an orthopedic doctor, but these treatments have not been effective. She wears a patch for pain relief and has undergone physical therapy, which included exercises with weights and rubber bands, as well as dry needling. The pain is present all day and exacerbated by certain movements.  Her current medications include estradiol cream for vaginal dryness, Emgality  for migraines, and tizanidine  for shoulder pain. She has a history of using Cymbalta for back pain. She fasts during the day and consumes a single meal in the evening, with a daily step count goal of 10,000 steps, though she often  achieves around 5,000 steps.         OBJECTIVE:       07/06/2024    4:22 PM  Depression screen PHQ 2/9  Decreased Interest 0  Down, Depressed, Hopeless 0  PHQ - 2 Score 0     BP Readings from Last 3 Encounters:  07/06/24 124/86  06/09/24 122/78  05/03/23 127/88    BP 124/86   Pulse 87   Ht 5' 2 (1.575 m)   Wt 186 lb 12.8 oz (84.7 kg)   SpO2 98%   BMI 34.17 kg/m    Physical Exam          Physical Exam  ASSESSMENT/PLAN:   Assessment & Plan Need for vaccination against Streptococcus pneumoniae  Annual physical exam  Hair loss  Scapular dyskinesis    Assessment and Plan    Adult Wellness Visit Routine wellness visit with discussion on hair loss, weight gain, and sleep issues potentially related to perimenopausal symptoms. Consideration of hormone replacement therapy (HRT) for symptom management, but concerns about migraines and cardiovascular risks were noted. - Ordered A1c test to assess for diabetes. - Ordered vitamin D  level. - Discussed potential for hormone replacement therapy with OB-GYN. -Comprehensive annual physical exam completed today. Reviewed interval history, current medical issues, medications, allergies, and preventive care needs. Addressed all patient questions and concerns. Discussed lifestyle factors including diet, exercise, sleep, and stress management. Reviewed recommended age-appropriate screenings, labs, and vaccinations. Counseling provided on healthy habits and routine health maintenance. Follow-up as  indicated based on findings and results. - Pneumovax administered.   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 Previous treatments with trigger point injections and tizanidine  were ineffective. Working diagnosis at this time. - Prescribed meloxicam  for anti-inflammatory effect. - Instructed to discontinue tizanidine . - Recommended physical therapy to strengthen  scapular muscles. - Plan for ultrasound-guided bursa injection if meloxicam  is ineffective.  Nonscarring hair loss Hair loss likely related to hormonal changes associated with perimenopause. Estrogen replacement therapy discussed but not initiated due to migraine history and cardiovascular risks. Alternative treatment with a low-dose blood pressure medication discussed. - Prescribed low-dose blood pressure medication to thicken existing hair. - Instructed to monitor for side effects and effectiveness over 2-3 weeks.  Perimenopausal symptoms (hot flashes, poor sleep, weight gain) Symptoms consistent with perimenopause, including hot flashes, poor sleep, and weight gain. Hormonal imbalance suspected. Estrogen replacement therapy considered but deferred due to migraine history and cardiovascular risks. - Discussed potential for hormone replacement therapy with OB-GYN. - Ordered A1c test to rule out diabetes as a contributing factor.  Migraine Migraines managed with Emgality , which is effective. Estrogen replacement therapy not recommended due to increased risk of stroke or heart attack. - Continue Emgality  for migraine management.         Ahlani Wickes A. Vita MD [facility]

## 2024-07-07 DIAGNOSIS — Z23 Encounter for immunization: Secondary | ICD-10-CM | POA: Diagnosis not present

## 2024-07-07 LAB — VITAMIN D 25 HYDROXY (VIT D DEFICIENCY, FRACTURES): Vit D, 25-Hydroxy: 17.2 ng/mL — ABNORMAL LOW (ref 30.0–100.0)

## 2024-07-07 LAB — HEMOGLOBIN A1C
Est. average glucose Bld gHb Est-mCnc: 111 mg/dL
Hgb A1c MFr Bld: 5.5 % (ref 4.8–5.6)

## 2024-07-10 ENCOUNTER — Ambulatory Visit: Payer: Self-pay | Admitting: Family Medicine

## 2024-07-10 DIAGNOSIS — E559 Vitamin D deficiency, unspecified: Secondary | ICD-10-CM

## 2024-07-10 MED ORDER — VITAMIN D (ERGOCALCIFEROL) 1.25 MG (50000 UNIT) PO CAPS: 50000.0000 [IU] | ORAL_CAPSULE | ORAL | 0 refills | Status: AC

## 2024-07-24 ENCOUNTER — Ambulatory Visit: Admitting: Family Medicine

## 2024-07-27 ENCOUNTER — Ambulatory Visit: Admitting: Family Medicine

## 2024-07-27 ENCOUNTER — Other Ambulatory Visit (INDEPENDENT_AMBULATORY_CARE_PROVIDER_SITE_OTHER): Payer: Self-pay

## 2024-07-27 VITALS — BP 120/82 | HR 84 | Wt 189.0 lb

## 2024-07-27 DIAGNOSIS — G2589 Other specified extrapyramidal and movement disorders: Secondary | ICD-10-CM

## 2024-07-27 MED ORDER — BETAMETHASONE SOD PHOS & ACET 6 (3-3) MG/ML IJ SUSP
12.0000 mg | Freq: Once | INTRAMUSCULAR | Status: AC
  Administered 2024-07-27: 12 mg via INTRAMUSCULAR

## 2024-07-27 MED ORDER — LIDOCAINE HCL 1 % IJ SOLN
5.0000 mL | Freq: Once | INTRAMUSCULAR | Status: AC
  Administered 2024-07-27: 5 mL via INTRADERMAL

## 2024-07-27 NOTE — Progress Notes (Signed)
 Name: Annette Ellison   Date of Visit: 07/27/2024   Date of last visit with me: 07/06/2024   CHIEF COMPLAINT:  Chief Complaint  Patient presents with   Follow-up    2 week follow up on shoulder, wanting injction. Most pain is right shoulder, pain on left as well has a knot on back of neck, thinking inflammation. Meloxicam  did help at little bit.        HPI:  Discussed the use of AI scribe software for clinical note transcription with the patient, who gave verbal consent to proceed.  History of Present Illness   Annette Ellison is a 38 year old female who presents with right shoulder pain and scapular dyskinesis.  She experiences significant pain in her right shoulder, with additional discomfort in her left shoulder, back, and neck. The pain is most intense around the shoulder blades and radiates across to the other side. She has previously received injections along her spine and shoulder blades to manage the pain.  She has been taking meloxicam , which has provided some relief, particularly improving her ability to sleep. However, the pain has not completely resolved and remains diffuse across the shoulder blades.  She is considering gastric sleeve surgery in [place] next week as a means to address her weight. She has been researching this option for over two years.  She also reports experiencing fluid retention, particularly noticeable during travel.         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  Symptoms partially relieved by meloxicam . Ultrasound confirmed scapular bursa inflammation. - Administered scapular bursa injection with steroid and anesthetic. - Encouraged posture correction exercises. - Consider further injections based on response.  Obesity Obesity contributing to scapular dyskinesis and bursitis. Discussed weight loss options including gastric bypass and compounded GLP-1 medications. - Consider compounded GLP-1 medications as alternative to gastric bypass. - Research Row for cost-effective GLP-1 options. - Encouraged lifestyle modifications for weight loss.      After informed consent was obtained, the patient was positioned comfortably in prone and the area over the right scapulothoracic bursa was prepped and draped in sterile fashion. Under ultrasound guidance, the skin and subcutaneous tissues were anesthetized with 4 cc of 1% lidocaine . Using real-time imaging to identify the scapulothoracic bursa and ensure safe needle trajectory, a needle was advanced into the bursal space without complication. A therapeutic mixture of 4 cc of 1% lidocaine  and 1 mL of Celestone was then injected with excellent spread visualized on ultrasound. The needle was withdrawn, the site was cleaned, and a sterile bandage was applied. The patient tolerated the procedure well without immediate complications and was given routine post-injection care instructions.  I personally spent a total of 38 minutes in the care of the patient today including preparing to see the patient, getting/reviewing separately obtained history, performing a medically appropriate exam/evaluation, counseling and educating, placing orders, referring  and communicating with other health care professionals, documenting clinical information in the EHR, independently interpreting results, communicating results, and coordinating care.   Annette Ellison A. Vita MD [facility]

## 2024-07-27 NOTE — Addendum Note (Signed)
 Addended by: LATTIE CARLO BROCKS on: 07/27/2024 11:23 AM   Modules accepted: Orders

## 2024-07-31 ENCOUNTER — Encounter: Payer: Self-pay | Admitting: Family Medicine

## 2024-07-31 ENCOUNTER — Ambulatory Visit: Admitting: Family Medicine

## 2024-07-31 VITALS — BP 124/82 | HR 96 | Wt 190.8 lb

## 2024-07-31 DIAGNOSIS — E669 Obesity, unspecified: Secondary | ICD-10-CM

## 2024-07-31 DIAGNOSIS — G2589 Other specified extrapyramidal and movement disorders: Secondary | ICD-10-CM

## 2024-07-31 NOTE — Progress Notes (Signed)
 Name: [name]   Date of Visit: 07/31/2024   Date of last visit with me: 07/27/2024   CHIEF COMPLAINT:  Chief Complaint  Patient presents with   Follow-up    Rt. Side shoulder blade injection,        HPI:  Discussed the use of AI scribe software for clinical note transcription with the patient, who gave verbal consent to proceed.  History of Present Illness   Shironda Kain is a 38 year old female who presents with back and shoulder pain.  She has experienced significant improvement in her back pain over the past week. Initially, the pain was severe, affecting her sleep and reducing it to none, but she is now able to sleep for about five hours. The pain was particularly severe in the shoulder and leg, with the back pain being worse than she initially realized.  She is planning a trip to [place] and has booked a flight for Wednesday. A friend recommended a place where she can receive a Myers cocktail IV for vitamins before her trip. She is aware of potential vitamin absorption issues following a gastric sleeve procedure. She is concerned about the possibility of becoming iron deficient, knowing others who have required monthly iron infusions post-procedure.         OBJECTIVE:       07/06/2024    4:22 PM  Depression screen PHQ 2/9  Decreased Interest 0  Down, Depressed, Hopeless 0  PHQ - 2 Score 0     BP Readings from Last 3 Encounters:  07/31/24 124/82  07/27/24 120/82  07/06/24 124/86    BP 124/82   Pulse 96   Wt 190 lb 12.8 oz (86.5 kg)   BMI 34.90 kg/m    Physical Exam          Physical Exam Constitutional:      Appearance: Normal appearance.  Neurological:     General: No focal deficit present.     Mental Status: She is alert and oriented to person, place, and time. Mental status is at baseline.     ASSESSMENT/PLAN:   Assessment & Plan Scapular dyskinesis  Obesity (BMI 30-39.9)    Assessment and Plan    Right shoulder  bursitis Improvement noted with increased sleep and reduced pain. Continued benefit expected with exercises. - Continue shoulder strengthening exercises. - Monitor improvement.  Left Shoulder scapularthoracic bursitis - Given persistent pain despite exercises, patient would like to go ahead with scapularthoracic bursa injection - Plan for injection today, patient tolerated without any difficult.   Planned gastric sleeve surgery Aware of potential vitamin absorption issues, including chronic iron deficiency. Plans to proceed with surgery. - Proceed with gastric sleeve surgery. - Monitor for vitamin deficiencies. - Consider Myers cocktail IV for supplementation.      Left scapulothoracic bursa injection After informed consent was obtained, the patient was positioned comfortably in prone and the area over the right scapulothoracic bursa was prepped and draped in sterile fashion. Under ultrasound guidance, the skin and subcutaneous tissues were anesthetized with 4 cc of 1% lidocaine . Using real-time imaging to identify the scapulothoracic bursa and ensure safe needle trajectory, a needle was advanced into the bursal space without complication. A therapeutic mixture of 4 cc of 1% lidocaine  and 1 mL of Celestone  was then injected with excellent spread visualized on ultrasound. The needle was withdrawn, the site was cleaned, and a sterile bandage was applied. The patient tolerated the procedure well without immediate complications and was  given routine post-injection care instructions.    Acel Natzke A. Vita MD [facility]

## 2024-08-02 ENCOUNTER — Other Ambulatory Visit (HOSPITAL_COMMUNITY): Payer: Self-pay

## 2024-08-02 ENCOUNTER — Ambulatory Visit: Admitting: Gastroenterology

## 2024-08-02 ENCOUNTER — Other Ambulatory Visit: Payer: Self-pay

## 2024-08-02 ENCOUNTER — Encounter: Payer: Self-pay | Admitting: Gastroenterology

## 2024-08-02 VITALS — BP 122/68 | HR 99 | Ht 62.0 in | Wt 189.6 lb

## 2024-08-02 DIAGNOSIS — K5900 Constipation, unspecified: Secondary | ICD-10-CM

## 2024-08-02 DIAGNOSIS — R1030 Lower abdominal pain, unspecified: Secondary | ICD-10-CM

## 2024-08-02 DIAGNOSIS — Z800 Family history of malignant neoplasm of digestive organs: Secondary | ICD-10-CM | POA: Diagnosis not present

## 2024-08-02 DIAGNOSIS — K219 Gastro-esophageal reflux disease without esophagitis: Secondary | ICD-10-CM

## 2024-08-02 DIAGNOSIS — R112 Nausea with vomiting, unspecified: Secondary | ICD-10-CM

## 2024-08-02 DIAGNOSIS — K5909 Other constipation: Secondary | ICD-10-CM

## 2024-08-02 MED ORDER — CHLORPROMAZINE HCL 25 MG PO TABS
25.0000 mg | ORAL_TABLET | ORAL | 1 refills | Status: DC | PRN
  Filled 2024-08-02: qty 20, 30d supply, fill #0

## 2024-08-02 MED ORDER — PANTOPRAZOLE SODIUM 40 MG PO TBEC: 40.0000 mg | DELAYED_RELEASE_TABLET | Freq: Every day | ORAL | 3 refills | Status: AC

## 2024-08-02 MED ORDER — LINACLOTIDE 72 MCG PO CAPS: 72.0000 ug | ORAL_CAPSULE | Freq: Every day | ORAL | 2 refills | Status: AC

## 2024-08-02 MED FILL — Topiramate Tab 200 MG: 400.0000 mg | ORAL | 30 days supply | Qty: 60 | Fill #0 | Status: CN

## 2024-08-02 MED FILL — Galcanezumab-gnlm Subcutaneous Soln Auto-Injector 120 MG/ML: 120.0000 mg | SUBCUTANEOUS | 30 days supply | Qty: 1 | Fill #0 | Status: CN

## 2024-08-02 MED FILL — Eletriptan Hydrobromide Tab 40 MG (Base Equivalent): 40.0000 mg | ORAL | 3 days supply | Qty: 6 | Fill #0 | Status: CN

## 2024-08-02 NOTE — Patient Instructions (Addendum)
° °  You have been scheduled for a colonoscopy. Please follow written instructions given to you at your visit today.   If you use inhalers (even only as needed), please bring them with you on the day of your procedure.  DO NOT TAKE 7 DAYS PRIOR TO TEST- Trulicity (dulaglutide) Ozempic , Wegovy  (semaglutide ) Mounjaro, Zepbound (tirzepatide) Bydureon Bcise (exanatide extended release)  DO NOT TAKE 1 DAY PRIOR TO YOUR TEST Rybelsus  (semaglutide ) Adlyxin (lixisenatide) Victoza (liraglutide) Byetta (exanatide) ______________________________________________________________  We have sent the following medications to your pharmacy for you to pick up at your convenience: Linzess , Pantoprazole    Stop Linzess  145mcg once daily  Start Linzess  72 mcg once daily   Due to recent changes in healthcare laws, you may see the results of your imaging and laboratory studies on MyChart before your provider has had a chance to review them.  We understand that in some cases there may be results that are confusing or concerning to you. Not all laboratory results come back in the same time frame and the provider may be waiting for multiple results in order to interpret others.  Please give us  48 hours in order for your provider to thoroughly review all the results before contacting the office for clarification of your results.   Thank you for choosing me and [facility].  Camie Furbish, PA-C

## 2024-08-02 NOTE — Progress Notes (Signed)
 Annette Ellison [id number] [birth year]   Chief Complaint: IBS, reflux  Referring Provider: Cristopher Suzen HERO, NP Primary GI MD: Dr. Legrand  HPI: Annette Ellison is a 38 y.o. female with past medical history of anxiety/depression, asthma, elevated cholesterol, GERD, IBS with constipation, lactose intolerance, hysterectomy who presents today for follow up.    Previously seen at [facility] clinic GI 2022 to establish care.  History of IBS and GERD. 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  Pain related to defecation and associated with change in stool frequency and formed.  She was advised to try IBgard, Levsin.  Also had a history of GERD and advised to start omeprazole 20 mg daily at that time.  Initially seen in office 06/09/2024.  Endorsed longstanding history of IBS which is constipation predominant.  Previous evaluation by other GI practices in the past.  No blood in stool or melena.  Endorsed intermittent lower abdominal pain which usually improves after bowel movement.  Was using MiraLAX as needed and Benefiber daily.  Occasionally having some alternating diarrhea.  She was given samples of Linzess  and advised to continue daily fiber supplementation, increase physical activity.  Also endorsed GERD, not currently on any reflux medications.  Some intermittent vomiting of nothing but acid and had tried omeprazole in the past but denied any improvement.  Reported having a prior EGD with no significant findings. Started on Protonix  40 mg with consideration for repeat EGD if symptoms persisted.  Past records were received which showed that she had a colonoscopy in 2014 which was entirely normal  (diagnostic, for abdominal pain).  Had another colonoscopy in 2019 for change in bowel habits which was normal aside from nonbleeding internal hemorrhoids.  Path showed focal active cryptitis, negative for dysplasia or malignancy and findings thought likely from bowel preparation.  She was advised to have a repeat colonoscopy at age 40.  Patient does have family history of colon cancer in her father who died at age 38 of the disease. Initially we had scheduled a colonoscopy, but after receiving past colonoscopy reports this was canceled to discuss further at follow-up (had a colonoscopy in 2014 and 2019, both done for workup of GI symptoms rather than colon cancer screening, and recommendation on 2019 procedure was for repeat at age 74).  Labs at last visit were normal, negative for celiac disease.   Dr. Legrand advised a gastric emptying study to further evaluate symptoms and rule out gastroparesis.  This came back normal.  H. pylori stool was negative.   Discussed the use of AI scribe software for clinical note transcription with the patient, who gave verbal consent to proceed.  History of Present Illness Annette Ellison is a 38 year old female who presents for follow-up on medication management and colon cancer screening.   Abdominal pain and bowel habit changes - Abdominal cramps and constipation prompted referral from urgent care. - Improvement in gastrointestinal symptoms since starting Linzess  and Protonix . - Diarrhea occurred with initial doses of Linzess , along with increased flatulence; symptoms subside once bowel movements are regular. - No hematochezia or melena. - Regular bowel movements with Linzess  145 mcg, but often having loose stools and wants to try lower dose - History of lower  abdominal pain associated with polycystic ovary syndrome (PCOS) and ovarian cysts. - Previously prescribed dicyclomine for abdominal cramps but not currently taking it.  Gastroesophageal reflux  and nausea - Taking Protonix  40 mg daily for reflux and heartburn. - Experiences occasional breakthrough reflux and heartburn, attributed to late meals during school schedule. - Symptoms improved after completing class and adjusting meal times. - Uses famotidine  as needed for breakthrough symptoms. - No dysphagia - Occasional morning nausea, decreased since last visit.  Colorectal cancer screening and family history - Family history significant for colon cancer; father diagnosed in his forties and deceased at age 82. - Uncertain of exact age of father's diagnosis, but aware he had prolonged illness (they did not have a close relationship so she was only made aware of his cancer diagnosis a couple years before he passed). - Colonoscopy in 2019 for changes in bowel habits was normal. - Previous colonoscopy and upper endoscopy in 2014 for abdominal pain, nausea, and heartburn.   Previous GI Procedures/Imaging   Colonoscopy 08/08/2018 (for change in bowel habits) - The entire examined colon is normal - The examined portion of the ileum was normal, biopsied - Non-bleeding internal hemorrhoids - The examination was otherwise normal - Random biopsies were obtained Path: Focal active cryptitis, negative for dysplasia or malignancy. Findings thought likely from bowel preparation and she was advised to have a repeat colonoscopy at age 38  CT A/P 07/11/2018 IMPRESSION: Stable exam. No acute findings or other significant abnormality identified.  Abdominal ultrasound 03/2013:  Largely unremarkable, with incidental finding of mildly enlarged ovary in the midline/left lower quadrant.  Also noted to have slightly echogenic echotexture of the liver.   Colonoscopy 04/10/2013 (diagnostic for abdominal pain) - Entire colon normal, biopsied - Examined portion of the ileum was normal Path: Colonic mucosa with no diagnostic abnormalities   EGD 04/10/2013 (for heartburn and nausea) - Normal  esophagus - Normal stomach, biopsied - Normal examined duodenum Path: Mild chronic gastritis Negative for H. pylori and dysplasia   Per office visit note 06/2013 with [facility] clinic GI, patient was suspected to have IBS-C and was started on Linzess  145 mcg.   Seen again at [facility] clinic in 2018 and endorsed having not had any resolution of symptoms since 2014.  Endorsed diarrhea 4-5 times a day at that time, GERD symptoms with nausea in the morning.   Past Medical History:  Diagnosis Date   Allergic rhinitis    Anxiety    Asthma    Back pain    Depression    Elevated blood pressure reading with diagnosis of hypertension 06/23/2022   Elevated cholesterol    GERD (gastroesophageal reflux disease)    IBS (irritable bowel syndrome)    Constipation   Joint pain    Lactose intolerance    Wears contact lenses     Past Surgical History:  Procedure Laterality Date   ABDOMINAL HYSTERECTOMY  2012   W/  LEFT SALPINGOOPHORECTOMY   CESAREAN SECTION  08-12-2010   CYSTO WITH HYDRODISTENSION N/A 03/15/2014   Procedure: CYSTO/HYDRODISTENSION OF BLADDER/INSTALLATION of marcaine  and pyridium ;  Surgeon: Glendia DELENA Elizabeth, MD;  Location: [facility];  Service: Urology;  Laterality: N/A;   LAPAROSCOPY W/ OVARIAN CYSTECTOMY  2010    Current Outpatient Medications  Medication Sig Dispense Refill   albuterol (VENTOLIN HFA) 108 (90 Base) MCG/ACT inhaler INHALE 2 PUFFS BY MOUTH FOUR TIMES DAILY FOR WHEEZING     cetirizine (ZYRTEC) 10 MG tablet Take 1 tablet by mouth  daily.     chlorproMAZINE  (THORAZINE ) 25 MG tablet Take 1-2 tablets (25-50 mg total) by mouth as needed for headache rescue. May repeat after 2-3 hours.  Limit 4 tablets/24 hours. *Caution: Highly sedating* 20 tablet 1   dicyclomine (BENTYL) 10 MG capsule Take 10 mg by mouth 4 (four) times daily as needed.     eletriptan  (RELPAX ) 40 MG tablet Take 1 tablet (40 mg total) by mouth as needed for migraine. May repeat once  after 2 hours. 6 tablet 2   EMGALITY  120 MG/ML SOAJ Inject 120 mg into the skin every 30 (thirty) days for migraine prevention. 1 mL 2   EPINEPHrine 0.3 mg/0.3 mL IJ SOAJ injection USE AS DIRECTED AS NEEDED FOR SYSTEMIC REACTIONS     estradiol (ESTRACE) 0.1 MG/GM vaginal cream Place vaginally.     famotidine  (PEPCID ) 20 MG tablet Take 1 tablet (20 mg total) by mouth at bedtime. 30 tablet 3   linaclotide  (LINZESS ) 145 MCG CAPS capsule Take 1 capsule (145 mcg total) by mouth daily before breakfast. 30 capsule 1   meloxicam  (MOBIC ) 15 MG tablet Take 1 tablet (15 mg total) by mouth daily. 30 tablet 0   minoxidil  (LONITEN ) 2.5 MG tablet Take 2 tablets (5 mg total) by mouth daily. 180 tablet 1   montelukast  (SINGULAIR ) 10 MG tablet Take 10 mg by mouth daily.     MYRBETRIQ 50 MG TB24 tablet Take 1 tablet every day by oral route.     pantoprazole  (PROTONIX ) 40 MG tablet Take 1 tablet (40 mg total) by mouth daily. 30 tablet 3   rizatriptan (MAXALT) 10 MG tablet Oral; Duration: 15     SLYND 4 MG TABS Take 1 tablet by mouth daily.     SYMBICORT 80-4.5 MCG/ACT inhaler 2 puffs Inhalation prn up to 12 puffs a day for 30 days     tiZANidine  (ZANAFLEX ) 4 MG tablet Take 4 mg by mouth 2 (two) times daily as needed.     topiramate  (TOPAMAX ) 200 MG tablet Take 2 tablets (400 mg total) by mouth daily. 60 tablet 2   VEOZAH 45 MG TABS Take 1 tablet by mouth daily.     Vitamin D , Ergocalciferol , (DRISDOL ) 1.25 MG (50000 UNIT) CAPS capsule Take 1 capsule (50,000 Units total) by mouth every 7 (seven) days. 12 capsule 0   No current facility-administered medications for this visit.    Allergies as of 08/02/2024 - Review Complete 08/02/2024  Allergen Reaction Noted   Hydrocodone -acetaminophen  Dermatitis and Rash 06/21/2014   Penicillins Shortness Of Breath and Rash 11/01/2013   Pineapple Shortness Of Breath and Rash 03/09/2014   Other Rash 03/09/2014   Vicodin [hydrocodone -acetaminophen ] Rash 11/01/2013    Family  History  Problem Relation Age of Onset   Migraines Mother    High Cholesterol Mother    High blood pressure Mother    Depression Mother    Obesity Mother    Cancer Father    Breast cancer Maternal Aunt    Mental illness Other    Stroke Other    Diabetes Other     Social History[1]   Review of Systems:    Constitutional: No weight loss, fever, chills Cardiovascular: No chest pain Respiratory: No SOB  Gastrointestinal: See HPI and otherwise negative   Physical Exam:  Vital signs: BP 122/68   Pulse 99   Ht 5' 2 (1.575 m)   Wt 189 lb 9 oz (86 kg)   BMI 34.67 kg/m   Wt Readings from Last  3 Encounters:  08/02/24 189 lb 9 oz (86 kg)  07/31/24 190 lb 12.8 oz (86.5 kg)  07/27/24 189 lb (85.7 kg)    Constitutional: Pleasant, overweight female in NAD, alert and cooperative Head:  Normocephalic and atraumatic.  Respiratory: Respirations even and unlabored. Lungs clear to auscultation bilaterally.  No wheezes, crackles, or rhonchi.  Cardiovascular:  Regular rate and rhythm. No murmurs. No peripheral edema. Gastrointestinal:  Soft, nondistended, minimal tenderness to palpation of lower abdomen. No rebound or guarding. Normal bowel sounds. No appreciable masses or hepatomegaly. Rectal:  Not performed.  Neurologic:  Alert and oriented x4;  grossly normal neurologically.  Skin:   Dry and intact without significant lesions or rashes. Psychiatric: Oriented to person, place and time. Demonstrates good judgement and reason without abnormal affect or behaviors.  RELEVANT LABS AND IMAGING: CBC    Component Value Date/Time   WBC 6.8 06/09/2024 0947   RBC 4.83 06/09/2024 0947   HGB 14.3 06/09/2024 0947   HGB 14.8 04/27/2022 0834   HCT 43.4 06/09/2024 0947   HCT 45.6 04/27/2022 0834   PLT 273.0 06/09/2024 0947   PLT 272 04/27/2022 0834   MCV 89.8 06/09/2024 0947   MCV 92 04/27/2022 0834   MCH 29.8 04/27/2022 0834   MCH 30.5 11/25/2021 0945   MCHC 33.1 06/09/2024 0947   RDW 13.3  06/09/2024 0947   RDW 12.4 04/27/2022 0834   LYMPHSABS 1.3 06/09/2024 0947   LYMPHSABS 1.5 04/27/2022 0834   MONOABS 0.4 06/09/2024 0947   EOSABS 0.0 06/09/2024 0947   EOSABS 0.1 04/27/2022 0834   BASOSABS 0.0 06/09/2024 0947   BASOSABS 0.1 04/27/2022 0834    CMP     Component Value Date/Time   NA 139 06/09/2024 0947   NA 140 02/01/2023 0839   K 4.0 06/09/2024 0947   CL 104 06/09/2024 0947   CO2 24 06/09/2024 0947   GLUCOSE 94 06/09/2024 0947   BUN 13 06/09/2024 0947   BUN 14 02/01/2023 0839   CREATININE 0.64 06/09/2024 0947   CALCIUM 9.5 06/09/2024 0947   PROT 7.5 06/09/2024 0947   PROT 6.7 02/01/2023 0839   ALBUMIN 4.4 06/09/2024 0947   ALBUMIN 4.2 02/01/2023 0839   AST 11 06/09/2024 0947   ALT 17 06/09/2024 0947   ALKPHOS 73 06/09/2024 0947   BILITOT 0.4 06/09/2024 0947   BILITOT <0.2 02/01/2023 0839   GFRNONAA >60 11/25/2021 0945   GFRAA >60 02/18/2016 0406     Assessment/Plan:   Assessment & Plan Chronic constipation Linzess  145 mcg helpful but causing some diarrhea and gas. Bowel movements daily with medication, otherwise will not have a bowel movement.  - Reduced Linzess  dose to 72 mcg daily, samples given. - Re-evaluate at follow up and consider alternative treatment if continuing to have side effects of gas and diarrhea  Gastroesophageal reflux disease Patient has had improvement in symptoms on Protonix  40 mg daily as well as as needed famotidine .  Still having breakthrough symptoms.  Intermittent morning nausea, though this has improved.  Lifestyle modifications have been helpful as well, she has had a change in her schedule as she is no longer in school at the moment, which required her to eat late in the evening.  - Continue Protonix  40 mg daily. - Use famotidine  as needed for breakthrough symptoms. - Advised to avoid late meals to reduce reflux symptoms. - Sent refill for Protonix . - Schedule EGD to be done along with upcoming colonoscopy to evaluate  health of esophagus and stomach  due to longstanding GERD with persistent breakthrough symptoms. I thoroughly discussed the procedure with the patient to include nature of the procedure, alternatives, benefits, and risks (including but not limited to bleeding, infection, perforation, anesthesia/cardiac/pulmonary complications). Patient verbalized understanding and gave verbal consent to proceed with procedure.   Family history of colon cancer Patient's father had colon cancer which she states was diagnosed in his 6s.  She is unsure exactly his age at diagnosis, but states he battled the disease for years and passed away at age 72.  They did not have a close relationship, and she was not made aware of his colon cancer diagnosis until 2 years prior to his death. Guidelines recommend starting colon cancer screening at age 40 or 10 years prior to FDR's age at diagnosis.  Based on guidelines, we will go ahead and schedule patient for colonoscopy due to her family history.  - Schedule colonoscopy. - Use existing bowel prep medication for the procedure.         Camie Furbish, PA-C [facility] 08/02/2024, 3:54 PM  Patient Care Team: Jha, Panav, MD as PCP - General (Family Medicine)       [1]  Social History Tobacco Use   Smoking status: Never   Smokeless tobacco: Never  Vaping Use   Vaping status: Never Used  Substance Use Topics   Alcohol use: Yes    Alcohol/week: 1.0 standard drink of alcohol    Types: 1 Glasses of wine per week    Comment: OCC   Drug use: No

## 2024-08-03 NOTE — Progress Notes (Signed)
 ____________________________________________________________  Attending physician addendum:  Thank you for sending this case to me. I have reviewed the entire note and agree with the plan.  [employer] would be another medication option for her IBS-C if insurance will approve and if not cost-prohibitive.  Victory Brand, MD  ____________________________________________________________

## 2024-09-04 ENCOUNTER — Ambulatory Visit: Admitting: Family Medicine

## 2024-09-04 ENCOUNTER — Encounter: Payer: Self-pay | Admitting: Family Medicine

## 2024-09-04 VITALS — BP 108/76 | HR 99 | Wt 175.6 lb

## 2024-09-04 DIAGNOSIS — Z903 Acquired absence of stomach [part of]: Secondary | ICD-10-CM

## 2024-09-04 NOTE — Progress Notes (Signed)
" ° °  Name: Annette Ellison   Date of Visit: 09/04/24   Date of last visit with me: 07/31/2024   CHIEF COMPLAINT:  Chief Complaint  Patient presents with    Post Op    Pt had a gastric sleeve done on 08/04/24, is here for blood work as the Provider who preformed the surgery asked her to get blood work done. Needs CBC, Chemistry panel, Vitamin D -Vitamin B-12, Ferritin, Transferrin, and iron folate plus a thyroid  panel.        HPI:  Discussed the use of AI scribe software for clinical note transcription with the patient, who gave verbal consent to proceed.  History of Present Illness   Annette Ellison is a 39 year old female who presents for a post-operative follow-up after laparoscopic surgery.  She is one month post-operative following laparoscopic surgery performed in December. Her hospital stay lasted two to three nights, after which she returned home on a direct flight, which she managed well despite its length. She felt more nervous than anything else prior to the surgery.  Post-operatively, she is taking omeprazole and sucralfate for stomach protection, along with a multivitamin, iron, and calcium supplements. She completed a course of antibiotics and ketorolac  for pain, which consisted of only four pills.  She reports significant improvement in her sleep since the surgery, stating that she is sleeping much better.         OBJECTIVE:       07/06/2024    4:22 PM  Depression screen PHQ 2/9  Decreased Interest 0  Down, Depressed, Hopeless 0  PHQ - 2 Score 0     BP Readings from Last 3 Encounters:  09/04/24 108/76  08/02/24 122/68  07/31/24 124/82    BP 108/76   Pulse 99   Wt 175 lb 9.6 oz (79.7 kg)   SpO2 98%   BMI 32.12 kg/m    Physical Exam          Physical Exam Constitutional:      Appearance: Normal appearance.  Neurological:     General: No focal deficit present.     Mental Status: She is alert and oriented to person, place, and time. Mental  status is at baseline.     ASSESSMENT/PLAN:   Assessment & Plan S/P gastric sleeve procedure    Assessment and Plan    Postoperative management following gastric sleeve surgery One month post laparoscopic gastric sleeve surgery without complications. On omeprazole and sucralfate for gastric protection. Taking multivitamins, iron, and calcium. - Continue omeprazole and sucralfate. - Continue multivitamins, iron, and calcium supplements. - Ordered labs to monitor nutritional status and absorption. - Review lab results and communicate concerns via MyChart.         Denim Start A. Vita MD [facility] "

## 2024-09-05 ENCOUNTER — Ambulatory Visit: Payer: Self-pay | Admitting: Family Medicine

## 2024-09-05 LAB — CBC WITH DIFFERENTIAL/PLATELET
Basophils Absolute: 0 x10E3/uL (ref 0.0–0.2)
Basos: 0 %
EOS (ABSOLUTE): 0 x10E3/uL (ref 0.0–0.4)
Eos: 0 %
Hematocrit: 45.9 % (ref 34.0–46.6)
Hemoglobin: 14.8 g/dL (ref 11.1–15.9)
Immature Grans (Abs): 0 x10E3/uL (ref 0.0–0.1)
Immature Granulocytes: 0 %
Lymphocytes Absolute: 1.3 x10E3/uL (ref 0.7–3.1)
Lymphs: 18 %
MCH: 29.4 pg (ref 26.6–33.0)
MCHC: 32.2 g/dL (ref 31.5–35.7)
MCV: 91 fL (ref 79–97)
Monocytes Absolute: 0.6 x10E3/uL (ref 0.1–0.9)
Monocytes: 8 %
Neutrophils Absolute: 5.4 x10E3/uL (ref 1.4–7.0)
Neutrophils: 74 %
Platelets: 312 x10E3/uL (ref 150–450)
RBC: 5.04 x10E6/uL (ref 3.77–5.28)
RDW: 13.1 % (ref 11.7–15.4)
WBC: 7.4 x10E3/uL (ref 3.4–10.8)

## 2024-09-05 LAB — LIPID PANEL
Chol/HDL Ratio: 4.1 ratio (ref 0.0–4.4)
Cholesterol, Total: 211 mg/dL — ABNORMAL HIGH (ref 100–199)
HDL: 52 mg/dL
LDL Chol Calc (NIH): 136 mg/dL — ABNORMAL HIGH (ref 0–99)
Triglycerides: 127 mg/dL (ref 0–149)
VLDL Cholesterol Cal: 23 mg/dL (ref 5–40)

## 2024-09-05 LAB — COMPREHENSIVE METABOLIC PANEL WITH GFR
ALT: 22 IU/L (ref 0–32)
AST: 21 IU/L (ref 0–40)
Albumin: 4.4 g/dL (ref 3.9–4.9)
Alkaline Phosphatase: 80 IU/L (ref 41–116)
BUN/Creatinine Ratio: 13 (ref 9–23)
BUN: 9 mg/dL (ref 6–20)
Bilirubin Total: 0.3 mg/dL (ref 0.0–1.2)
CO2: 23 mmol/L (ref 20–29)
Calcium: 10 mg/dL (ref 8.7–10.2)
Chloride: 105 mmol/L (ref 96–106)
Creatinine, Ser: 0.71 mg/dL (ref 0.57–1.00)
Globulin, Total: 2.1 g/dL (ref 1.5–4.5)
Glucose: 89 mg/dL (ref 70–99)
Potassium: 4.6 mmol/L (ref 3.5–5.2)
Sodium: 141 mmol/L (ref 134–144)
Total Protein: 6.5 g/dL (ref 6.0–8.5)
eGFR: 112 mL/min/1.73

## 2024-09-05 LAB — IRON,TIBC AND FERRITIN PANEL
Ferritin: 120 ng/mL (ref 15–150)
Iron Saturation: 26 % (ref 15–55)
Iron: 80 ug/dL (ref 27–159)
Total Iron Binding Capacity: 303 ug/dL (ref 250–450)
UIBC: 223 ug/dL (ref 131–425)

## 2024-09-05 LAB — TSH+FREE T4
Free T4: 0.98 ng/dL (ref 0.82–1.77)
TSH: 0.864 u[IU]/mL (ref 0.450–4.500)

## 2024-09-05 LAB — VITAMIN B12: Vitamin B-12: 1008 pg/mL (ref 232–1245)

## 2024-09-05 LAB — T3, FREE: T3, Free: 3 pg/mL (ref 2.0–4.4)

## 2024-09-05 LAB — VITAMIN D 25 HYDROXY (VIT D DEFICIENCY, FRACTURES): Vit D, 25-Hydroxy: 26.3 ng/mL — AB (ref 30.0–100.0)

## 2024-09-21 ENCOUNTER — Encounter: Payer: Self-pay | Admitting: Family Medicine

## 2024-09-21 ENCOUNTER — Other Ambulatory Visit: Payer: Self-pay

## 2024-09-28 ENCOUNTER — Encounter: Admitting: Gastroenterology
